# Patient Record
Sex: Male | Born: 1975 | Race: Black or African American | Hispanic: No | Marital: Single | Smoking: Current some day smoker
Health system: Southern US, Community
[De-identification: ages and names within clinical notes are randomized; demographics above are authoritative.]

## PROBLEM LIST (undated history)

## (undated) ENCOUNTER — Ambulatory Visit (HOSPITAL_COMMUNITY): Admission: EM | Payer: Self-pay

## (undated) ENCOUNTER — Ambulatory Visit (HOSPITAL_COMMUNITY): Admission: EM | Payer: Self-pay | Source: Home / Self Care

## (undated) DIAGNOSIS — F1410 Cocaine abuse, uncomplicated: Secondary | ICD-10-CM

## (undated) DIAGNOSIS — Z720 Tobacco use: Secondary | ICD-10-CM

## (undated) DIAGNOSIS — IMO0002 Reserved for concepts with insufficient information to code with codable children: Secondary | ICD-10-CM

---

## 2007-07-25 ENCOUNTER — Emergency Department (HOSPITAL_COMMUNITY): Admission: EM | Admit: 2007-07-25 | Discharge: 2007-07-25 | Payer: Self-pay | Admitting: Emergency Medicine

## 2009-02-27 ENCOUNTER — Emergency Department (HOSPITAL_COMMUNITY): Admission: EM | Admit: 2009-02-27 | Discharge: 2009-02-27 | Payer: Self-pay | Admitting: Emergency Medicine

## 2011-03-10 ENCOUNTER — Inpatient Hospital Stay (INDEPENDENT_AMBULATORY_CARE_PROVIDER_SITE_OTHER)
Admission: RE | Admit: 2011-03-10 | Discharge: 2011-03-10 | Disposition: A | Discharge status: Home / Self Care | Payer: Self-pay | Source: Ambulatory Visit | Attending: Emergency Medicine | Admitting: Emergency Medicine

## 2011-03-10 ENCOUNTER — Ambulatory Visit (INDEPENDENT_AMBULATORY_CARE_PROVIDER_SITE_OTHER): Payer: Self-pay

## 2011-03-10 DIAGNOSIS — S62309A Unspecified fracture of unspecified metacarpal bone, initial encounter for closed fracture: Secondary | ICD-10-CM

## 2011-03-17 ENCOUNTER — Emergency Department (HOSPITAL_COMMUNITY)
Admission: EM | Admit: 2011-03-17 | Discharge: 2011-03-17 | Discharge status: Against Medical Advice | Payer: Self-pay | Attending: Emergency Medicine | Admitting: Emergency Medicine

## 2011-03-17 DIAGNOSIS — M79609 Pain in unspecified limb: Secondary | ICD-10-CM | POA: Insufficient documentation

## 2011-03-17 DIAGNOSIS — X58XXXA Exposure to other specified factors, initial encounter: Secondary | ICD-10-CM | POA: Insufficient documentation

## 2011-03-17 DIAGNOSIS — M25549 Pain in joints of unspecified hand: Secondary | ICD-10-CM | POA: Insufficient documentation

## 2011-03-17 DIAGNOSIS — S62309A Unspecified fracture of unspecified metacarpal bone, initial encounter for closed fracture: Secondary | ICD-10-CM | POA: Insufficient documentation

## 2018-07-04 ENCOUNTER — Other Ambulatory Visit: Payer: Self-pay

## 2018-07-04 ENCOUNTER — Emergency Department (HOSPITAL_COMMUNITY)
Admission: EM | Admit: 2018-07-04 | Discharge: 2018-07-04 | Payer: Self-pay | Attending: Emergency Medicine | Admitting: Emergency Medicine

## 2018-07-04 ENCOUNTER — Encounter (HOSPITAL_COMMUNITY): Payer: Self-pay | Admitting: Emergency Medicine

## 2018-07-04 DIAGNOSIS — H5789 Other specified disorders of eye and adnexa: Secondary | ICD-10-CM | POA: Insufficient documentation

## 2018-07-04 DIAGNOSIS — Z77098 Contact with and (suspected) exposure to other hazardous, chiefly nonmedicinal, chemicals: Secondary | ICD-10-CM | POA: Insufficient documentation
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
## 2018-07-04 NOTE — ED Notes (Signed)
Morgan lens placed to blt eyes for irrigation. Pt tolerating at this time. Tetracaine drops placed prior to morgan lens insertion per MD Lynelle DoctorKnapp verbal order.

## 2018-07-04 NOTE — Discharge Instructions (Addendum)
Return here as needed. °
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
## 2018-07-04 NOTE — ED Notes (Signed)
Patient verbalizes understanding of discharge instructions. Opportunity for questioning and answers were provided. Armband removed by staff, pt discharged from ED ambulatory in custody w/ [employer]

## 2020-11-21 ENCOUNTER — Emergency Department (HOSPITAL_COMMUNITY): Payer: Self-pay

## 2020-11-21 ENCOUNTER — Emergency Department (HOSPITAL_COMMUNITY)
Admission: EM | Admit: 2020-11-21 | Discharge: 2020-11-21 | Disposition: A | Discharge status: Home / Self Care | Payer: Self-pay | Attending: Emergency Medicine | Admitting: Emergency Medicine

## 2020-11-21 ENCOUNTER — Other Ambulatory Visit: Payer: Self-pay

## 2020-11-21 ENCOUNTER — Encounter (HOSPITAL_COMMUNITY): Payer: Self-pay

## 2020-11-21 DIAGNOSIS — R109 Unspecified abdominal pain: Secondary | ICD-10-CM | POA: Insufficient documentation

## 2020-11-21 DIAGNOSIS — L0291 Cutaneous abscess, unspecified: Secondary | ICD-10-CM

## 2020-11-21 DIAGNOSIS — L0390 Cellulitis, unspecified: Secondary | ICD-10-CM | POA: Insufficient documentation

## 2020-11-21 DIAGNOSIS — Z7984 Long term (current) use of oral hypoglycemic drugs: Secondary | ICD-10-CM | POA: Insufficient documentation

## 2020-11-21 DIAGNOSIS — L02611 Cutaneous abscess of right foot: Secondary | ICD-10-CM | POA: Insufficient documentation

## 2020-11-21 DIAGNOSIS — R350 Frequency of micturition: Secondary | ICD-10-CM | POA: Insufficient documentation

## 2020-11-21 DIAGNOSIS — R739 Hyperglycemia, unspecified: Secondary | ICD-10-CM | POA: Insufficient documentation

## 2020-11-21 LAB — URINALYSIS, ROUTINE W REFLEX MICROSCOPIC
Bacteria, UA: NONE SEEN
Bilirubin Urine: NEGATIVE
Glucose, UA: >=500 mg/dL — AB
Hgb urine dipstick: NEGATIVE
Ketones, ur: 20 mg/dL — AB
Leukocytes,Ua: NEGATIVE
Nitrite: NEGATIVE
Protein, ur: NEGATIVE mg/dL
Specific Gravity, Urine: 1.031 — ABNORMAL HIGH (ref 1.005–1.030)
pH: 6 (ref 5.0–8.0)

## 2020-11-21 LAB — COMPREHENSIVE METABOLIC PANEL
ALT: 13 U/L (ref 0–44)
AST: 13 U/L — ABNORMAL LOW (ref 15–41)
Albumin: 2.7 g/dL — ABNORMAL LOW (ref 3.5–5.0)
Alkaline Phosphatase: 121 U/L (ref 38–126)
Anion gap: 10 (ref 5–15)
BUN: 8 mg/dL (ref 6–20)
CO2: 24 mmol/L (ref 22–32)
Calcium: 8.9 mg/dL (ref 8.9–10.3)
Chloride: 96 mmol/L — ABNORMAL LOW (ref 98–111)
Creatinine, Ser: 1.07 mg/dL (ref 0.61–1.24)
GFR, Estimated: >60 mL/min (ref >60)
Glucose, Bld: 624 mg/dL (ref 70–99)
Potassium: 3.7 mmol/L (ref 3.5–5.1)
Sodium: 130 mmol/L — ABNORMAL LOW (ref 135–145)
Total Bilirubin: 0.6 mg/dL (ref 0.3–1.2)
Total Protein: 7.8 g/dL (ref 6.5–8.1)

## 2020-11-21 LAB — PROTIME-INR
INR: 1.1 (ref 0.8–1.2)
Prothrombin Time: 13.9 seconds (ref 11.4–15.2)

## 2020-11-21 LAB — CBC WITH DIFFERENTIAL/PLATELET
Abs Immature Granulocytes: 0.07 10*3/uL (ref 0.00–0.07)
Basophils Absolute: 0.1 10*3/uL (ref 0.0–0.1)
Basophils Relative: 0 %
Eosinophils Absolute: 0 10*3/uL (ref 0.0–0.5)
Eosinophils Relative: 0 %
HCT: 39 % (ref 39.0–52.0)
Hemoglobin: 12.5 g/dL — ABNORMAL LOW (ref 13.0–17.0)
Immature Granulocytes: 1 %
Lymphocytes Relative: 10 %
Lymphs Abs: 1.5 10*3/uL (ref 0.7–4.0)
MCH: 26.2 pg (ref 26.0–34.0)
MCHC: 32.1 g/dL (ref 30.0–36.0)
MCV: 81.6 fL (ref 80.0–100.0)
Monocytes Absolute: 1.1 10*3/uL — ABNORMAL HIGH (ref 0.1–1.0)
Monocytes Relative: 7 %
Neutro Abs: 12.4 10*3/uL — ABNORMAL HIGH (ref 1.7–7.7)
Neutrophils Relative %: 82 %
Platelets: 318 10*3/uL (ref 150–400)
RBC: 4.78 MIL/uL (ref 4.22–5.81)
RDW: 12.2 % (ref 11.5–15.5)
WBC: 15.2 10*3/uL — ABNORMAL HIGH (ref 4.0–10.5)
nRBC: 0 % (ref 0.0–0.2)

## 2020-11-21 LAB — CBG MONITORING, ED: Glucose-Capillary: 397 mg/dL — ABNORMAL HIGH (ref 70–99)

## 2020-11-21 LAB — CK: Total CK: 158 U/L (ref 49–397)

## 2020-11-21 MED ORDER — LORAZEPAM 1 MG PO TABS
2.0000 mg | ORAL_TABLET | Freq: Once | ORAL | Status: AC
  Administered 2020-11-21: 2 mg via ORAL
  Filled 2020-11-21 (×2): qty 2

## 2020-11-21 MED ORDER — METFORMIN HCL 500 MG PO TABS
500.0000 mg | ORAL_TABLET | Freq: Once | ORAL | Status: AC
  Administered 2020-11-21: 500 mg via ORAL
  Filled 2020-11-21: qty 1

## 2020-11-21 MED ORDER — LACTATED RINGERS IV BOLUS
1000.0000 mL | Freq: Once | INTRAVENOUS | Status: AC
  Administered 2020-11-21: 1000 mL via INTRAVENOUS

## 2020-11-21 MED ORDER — LIDOCAINE-EPINEPHRINE (PF) 2 %-1:200000 IJ SOLN
20.0000 mL | Freq: Once | INTRAMUSCULAR | Status: AC
  Administered 2020-11-21: 20 mL
  Filled 2020-11-21: qty 20

## 2020-11-21 MED ORDER — LIDOCAINE-PRILOCAINE 2.5-2.5 % EX CREA
TOPICAL_CREAM | Freq: Once | CUTANEOUS | Status: AC
  Filled 2020-11-21: qty 5

## 2020-11-21 MED ORDER — OXYCODONE-ACETAMINOPHEN 5-325 MG PO TABS
2.0000 | ORAL_TABLET | Freq: Once | ORAL | Status: AC
  Administered 2020-11-21: 2 via ORAL
  Filled 2020-11-21: qty 2

## 2020-11-21 MED ORDER — SULFAMETHOXAZOLE-TRIMETHOPRIM 800-160 MG PO TABS: 1.0000 | ORAL_TABLET | Freq: Two times a day (BID) | ORAL | 0 refills | Status: AC

## 2020-11-21 MED ORDER — SULFAMETHOXAZOLE-TRIMETHOPRIM 800-160 MG PO TABS
1.0000 | ORAL_TABLET | Freq: Once | ORAL | Status: AC
  Administered 2020-11-21: 1 via ORAL
  Filled 2020-11-21: qty 1

## 2020-11-21 MED ORDER — METFORMIN HCL 500 MG PO TABS: 500.0000 mg | ORAL_TABLET | Freq: Two times a day (BID) | ORAL | 0 refills | Status: DC

## 2020-11-21 NOTE — ED Notes (Signed)
Pt reports pain and swelling in right lower leg after believed brown recluse spider bite approximately 11 days ago in increasing swelling, redness, and discoloration on bottom of foot and traveling up leg. Fever, chills, sweats, and diarrhea noted.  Increase in urination and left flank starting 9 days ago.

## 2020-11-21 NOTE — ED Provider Notes (Signed)
[facility] EMERGENCY DEPARTMENT Provider Note   CSN: [id number] Arrival date & time: 11/21/20  0054     History Chief Complaint  Patient presents with  . Insect Bite    Jeffrey Webb is a 45 y.o. male.  Patient states he was bitten by a spider that he believes was a brown recluse 9 days ago.  Patient states that redness and swelling is progressively worsened since that time.  States that he is keeping his kids awake at night because it hurts so bad.  No fevers.  No nausea or vomiting.  He does have some mild increased urination and left-sided flank pain apparently.    History reviewed. No pertinent past medical history.  There are no problems to display for this patient.   History reviewed. No pertinent surgical history.     History reviewed. No pertinent family history.  Social History   Substance Use Topics  . Alcohol use: Not Currently  . Drug use: Not Currently    Home Medications Prior to Admission medications   Medication Sig Start Date End Date Taking? Authorizing Provider  acetaminophen (TYLENOL) 500 MG tablet Take 1,000 mg by mouth every 6 (six) hours as needed for mild pain.   Yes [provider]  ibuprofen (ADVIL) 200 MG tablet Take 200 mg by mouth every 6 (six) hours as needed for mild pain.   Yes [provider]  metFORMIN (GLUCOPHAGE) 500 MG tablet Take 1 tablet (500 mg total) by mouth 2 (two) times daily with a meal. 11/21/20  Yes Sarath Privott, Barbara Cower, MD  sulfamethoxazole-trimethoprim (BACTRIM DS) 800-160 MG tablet Take 1 tablet by mouth 2 (two) times daily for 7 days. 11/21/20 11/28/20 Yes [place] Oborn, Barbara Cower, MD    Allergies    Patient has no known allergies.  Review of Systems   Review of Systems  All other systems reviewed and are negative.   Physical Exam Updated Vital Signs BP 132/90   Pulse 100   Temp 99 F (37.2 C) (Oral)   Resp 16   Ht 5\' 6"  (1.676 m)   Wt 90.7 kg   SpO2 100%   BMI 32.28 kg/m   Physical  Exam Vitals and nursing note reviewed.  Constitutional:      Appearance: He is well-developed.  HENT:     Head: Normocephalic and atraumatic.     Nose: Nose normal. No congestion or rhinorrhea.     Mouth/Throat:     Mouth: Mucous membranes are moist.     Pharynx: Oropharynx is clear.  Eyes:     Pupils: Pupils are equal, round, and reactive to light.  Cardiovascular:     Rate and Rhythm: Normal rate.  Pulmonary:     Effort: Pulmonary effort is normal. No respiratory distress.  Abdominal:     General: Abdomen is flat. There is no distension.  Musculoskeletal:        General: Normal range of motion.     Cervical back: Normal range of motion.  Skin:    General: Skin is warm and dry.     Findings: Erythema (right foot into lower right leg. also associated with 2x2 cm abscess to plantar surface of foot) present.  Neurological:     General: No focal deficit present.     Mental Status: He is alert.     ED Results / Procedures / Treatments   Labs (all labs ordered are listed, but only abnormal results are displayed) Labs Reviewed  CBC WITH DIFFERENTIAL/PLATELET - Abnormal; Notable  for the following components:      Result Value   WBC 15.2 (*)    Hemoglobin 12.5 (*)    Neutro Abs 12.4 (*)    Monocytes Absolute 1.1 (*)    All other components within normal limits  COMPREHENSIVE METABOLIC PANEL - Abnormal; Notable for the following components:   Sodium 130 (*)    Chloride 96 (*)    Glucose, Bld 624 (*)    Albumin 2.7 (*)    AST 13 (*)    All other components within normal limits  URINALYSIS, ROUTINE W REFLEX MICROSCOPIC - Abnormal; Notable for the following components:   Color, Urine STRAW (*)    Specific Gravity, Urine 1.031 (*)    Glucose, UA >=500 (*)    Ketones, ur 20 (*)    All other components within normal limits  CBG MONITORING, ED - Abnormal; Notable for the following components:   Glucose-Capillary 397 (*)    All other components within normal limits   PROTIME-INR  CK  CBG MONITORING, ED    EKG None  Radiology DG Foot Complete Right  Result Date: 11/21/2020 CLINICAL DATA:  45 year old male with insect bite to the right foot. EXAM: RIGHT FOOT COMPLETE - 3+ VIEW COMPARISON:  None. FINDINGS: There is no acute fracture or dislocation. The bones are well mineralized. No significant arthritic changes. Mild diffuse skin thickening. No radiopaque foreign object or soft tissue gas. IMPRESSION: Negative. Electronically Signed   By: Elgie Collard M.D.   On: 11/21/2020 02:19    Procedures .Marland KitchenIncision and Drainage  Date/Time: 11/21/2020 6:01 AM Performed by: Marily Memos, MD Authorized by: Marily Memos, MD   Consent:    Consent obtained:  Verbal   Risks, benefits, and alternatives were discussed: yes     Risks discussed:  Incomplete drainage, bleeding and infection Universal protocol:    Procedure explained and questions answered to patient or proxy's satisfaction: no     Relevant documents present and verified: yes     Test results available : yes     Imaging studies available: yes     Site/side marked: yes     Patient identity confirmed:  Verbally with patient and hospital-assigned identification number Location:    Type:  Abscess   Size:  2x2   Location:  Lower extremity   Lower extremity location:  Foot   Foot location:  R foot     Medications Ordered in ED Medications  sulfamethoxazole-trimethoprim (BACTRIM DS) 800-160 MG per tablet 1 tablet (1 tablet Oral Given 11/21/20 0238)  oxyCODONE-acetaminophen (PERCOCET/ROXICET) 5-325 MG per tablet 2 tablet (2 tablets Oral Given 11/21/20 0237)  LORazepam (ATIVAN) tablet 2 mg (2 mg Oral Given 11/21/20 0251)  lidocaine-prilocaine (EMLA) cream ( Topical Given 11/21/20 0300)  lidocaine-EPINEPHrine (XYLOCAINE W/EPI) 2 %-1:200000 (PF) injection 20 mL (20 mLs Infiltration Given 11/21/20 0350)  lactated ringers bolus 1,000 mL (0 mLs Intravenous Stopped 11/21/20 0514)  metFORMIN (GLUCOPHAGE) tablet  500 mg (500 mg Oral Given 11/21/20 0429)  lactated ringers bolus 1,000 mL (0 mLs Intravenous Stopped 11/21/20 1610)    ED Course  I have reviewed the triage vital signs and the nursing notes.  Pertinent labs & imaging results that were available during my care of the patient were reviewed by me and considered in my medical decision making (see chart for details).    MDM Rules/Calculators/A&P  Symptomatic care and then I&D. abx initiated. At this point it seems to be cellulitis and abscess. May or may not have had an actual spider bite.   I&D done. Found to have pretty significant hyperglycemia so was treated with a couple liters of fluid.  His blood sugar started to come down in the 300s at least.  Another liter of fluids were given and the patient was started on metformin.  I put in a referral for kidney health and wellness and get follow-up to get his new medications  Final Clinical Impression(s) / ED Diagnoses Final diagnoses:  Cellulitis, unspecified cellulitis site  Abscess  Hyperglycemia    Rx / DC Orders ED Discharge Orders         Ordered    sulfamethoxazole-trimethoprim (BACTRIM DS) 800-160 MG tablet  2 times daily        11/21/20 0552    metFORMIN (GLUCOPHAGE) 500 MG tablet  2 times daily with meals        11/21/20 0552           Magie Ciampa, Barbara Cower, MD 11/21/20 2303

## 2020-11-21 NOTE — ED Triage Notes (Signed)
Patient reports he got a bite to the bottom of his R foot about 1 week ago from what he thinks was a spider, swelling and redness noted, states he is unable to bear weight on that foot

## 2020-11-26 ENCOUNTER — Encounter (HOSPITAL_COMMUNITY): Payer: Self-pay | Admitting: *Deleted

## 2020-11-26 ENCOUNTER — Emergency Department (HOSPITAL_COMMUNITY)
Admission: EM | Admit: 2020-11-26 | Discharge: 2020-11-26 | Disposition: A | Discharge status: Home / Self Care | Payer: Self-pay | Attending: Emergency Medicine | Admitting: Emergency Medicine

## 2020-11-26 ENCOUNTER — Other Ambulatory Visit: Payer: Self-pay

## 2020-11-26 DIAGNOSIS — L02611 Cutaneous abscess of right foot: Secondary | ICD-10-CM | POA: Insufficient documentation

## 2020-11-26 DIAGNOSIS — M545 Low back pain, unspecified: Secondary | ICD-10-CM | POA: Insufficient documentation

## 2020-11-26 DIAGNOSIS — F17210 Nicotine dependence, cigarettes, uncomplicated: Secondary | ICD-10-CM | POA: Insufficient documentation

## 2020-11-26 DIAGNOSIS — R739 Hyperglycemia, unspecified: Secondary | ICD-10-CM | POA: Insufficient documentation

## 2020-11-26 DIAGNOSIS — Z7984 Long term (current) use of oral hypoglycemic drugs: Secondary | ICD-10-CM | POA: Insufficient documentation

## 2020-11-26 LAB — URINALYSIS, ROUTINE W REFLEX MICROSCOPIC
Bacteria, UA: NONE SEEN
Bilirubin Urine: NEGATIVE
Glucose, UA: >=500 mg/dL — AB
Hgb urine dipstick: NEGATIVE
Ketones, ur: 20 mg/dL — AB
Leukocytes,Ua: NEGATIVE
Nitrite: NEGATIVE
Protein, ur: NEGATIVE mg/dL
Specific Gravity, Urine: 1.029 (ref 1.005–1.030)
pH: 5 (ref 5.0–8.0)

## 2020-11-26 LAB — CBC WITH DIFFERENTIAL/PLATELET
Abs Immature Granulocytes: 0.05 10*3/uL (ref 0.00–0.07)
Basophils Absolute: 0.1 10*3/uL (ref 0.0–0.1)
Basophils Relative: 1 %
Eosinophils Absolute: 0.1 10*3/uL (ref 0.0–0.5)
Eosinophils Relative: 1 %
HCT: 40.3 % (ref 39.0–52.0)
Hemoglobin: 12.9 g/dL — ABNORMAL LOW (ref 13.0–17.0)
Immature Granulocytes: 1 %
Lymphocytes Relative: 20 %
Lymphs Abs: 1.9 10*3/uL (ref 0.7–4.0)
MCH: 25.7 pg — ABNORMAL LOW (ref 26.0–34.0)
MCHC: 32 g/dL (ref 30.0–36.0)
MCV: 80.4 fL (ref 80.0–100.0)
Monocytes Absolute: 0.8 10*3/uL (ref 0.1–1.0)
Monocytes Relative: 9 %
Neutro Abs: 6.5 10*3/uL (ref 1.7–7.7)
Neutrophils Relative %: 68 %
Platelets: 479 10*3/uL — ABNORMAL HIGH (ref 150–400)
RBC: 5.01 MIL/uL (ref 4.22–5.81)
RDW: 12.4 % (ref 11.5–15.5)
WBC: 9.5 10*3/uL (ref 4.0–10.5)
nRBC: 0 % (ref 0.0–0.2)

## 2020-11-26 LAB — COMPREHENSIVE METABOLIC PANEL
ALT: 13 U/L (ref 0–44)
AST: 18 U/L (ref 15–41)
Albumin: 2.8 g/dL — ABNORMAL LOW (ref 3.5–5.0)
Alkaline Phosphatase: 115 U/L (ref 38–126)
Anion gap: 10 (ref 5–15)
BUN: 10 mg/dL (ref 6–20)
CO2: 25 mmol/L (ref 22–32)
Calcium: 9.2 mg/dL (ref 8.9–10.3)
Chloride: 96 mmol/L — ABNORMAL LOW (ref 98–111)
Creatinine, Ser: 1.12 mg/dL (ref 0.61–1.24)
GFR, Estimated: >60 mL/min (ref >60)
Glucose, Bld: 512 mg/dL (ref 70–99)
Potassium: 4.4 mmol/L (ref 3.5–5.1)
Sodium: 131 mmol/L — ABNORMAL LOW (ref 135–145)
Total Bilirubin: 0.3 mg/dL (ref 0.3–1.2)
Total Protein: 8.3 g/dL — ABNORMAL HIGH (ref 6.5–8.1)

## 2020-11-26 LAB — CBG MONITORING, ED
Glucose-Capillary: 551 mg/dL (ref 70–99)
Glucose-Capillary: 305 mg/dL — ABNORMAL HIGH (ref 70–99)

## 2020-11-26 MED ORDER — ACETAMINOPHEN 500 MG PO TABS
1000.0000 mg | ORAL_TABLET | Freq: Once | ORAL | Status: AC
  Administered 2020-11-26: 1000 mg via ORAL
  Filled 2020-11-26: qty 2

## 2020-11-26 MED ORDER — KETOROLAC TROMETHAMINE 15 MG/ML IJ SOLN
15.0000 mg | Freq: Once | INTRAMUSCULAR | Status: AC
  Administered 2020-11-26: 15 mg via INTRAMUSCULAR

## 2020-11-26 MED ORDER — MORPHINE SULFATE (PF) 4 MG/ML IV SOLN
4.0000 mg | Freq: Once | INTRAVENOUS | Status: AC
  Administered 2020-11-26: 4 mg via INTRAVENOUS
  Filled 2020-11-26: qty 1

## 2020-11-26 MED ORDER — SODIUM CHLORIDE 0.9 % IV BOLUS (SEPSIS)
1000.0000 mL | Freq: Once | INTRAVENOUS | Status: AC
  Administered 2020-11-26: 1000 mL via INTRAVENOUS

## 2020-11-26 MED ORDER — TRAMADOL HCL 50 MG PO TABS
50.0000 mg | ORAL_TABLET | Freq: Once | ORAL | Status: AC
  Administered 2020-11-26: 50 mg via ORAL
  Filled 2020-11-26: qty 1

## 2020-11-26 MED ORDER — ACETAMINOPHEN 325 MG PO TABS: 325.0000 mg | ORAL_TABLET | Freq: Once | ORAL | Status: DC

## 2020-11-26 MED ORDER — METHOCARBAMOL 500 MG PO TABS: 500.0000 mg | ORAL_TABLET | Freq: Four times a day (QID) | ORAL | 0 refills | Status: DC

## 2020-11-26 MED ORDER — KETOROLAC TROMETHAMINE 15 MG/ML IJ SOLN: 15.0000 mg | Freq: Once | INTRAMUSCULAR | Status: DC

## 2020-11-26 MED ORDER — SODIUM CHLORIDE 0.9 % IV SOLN
1000.0000 mL | INTRAVENOUS | Status: DC
  Administered 2020-11-26: 1000 mL via INTRAVENOUS

## 2020-11-26 MED ORDER — INSULIN ASPART 100 UNIT/ML IJ SOLN
10.0000 [IU] | Freq: Once | INTRAMUSCULAR | Status: AC
  Administered 2020-11-26: 10 [IU] via SUBCUTANEOUS

## 2020-11-26 MED ORDER — TRAMADOL HCL 50 MG PO TABS: 50.0000 mg | ORAL_TABLET | Freq: Four times a day (QID) | ORAL | 0 refills | Status: DC | PRN

## 2020-11-26 MED ORDER — KETOROLAC TROMETHAMINE 15 MG/ML IJ SOLN
15.0000 mg | Freq: Once | INTRAMUSCULAR | Status: DC
  Filled 2020-11-26: qty 1

## 2020-11-26 NOTE — ED Provider Notes (Signed)
[facility] EMERGENCY DEPARTMENT Provider Note   CSN: [id number] Arrival date & time: 11/26/20  0540     History Chief Complaint  Patient presents with  . Back Pain    Jeffrey Webb is a 45 y.o. male.  Pt has been sitting and lying due to foot infection.  Pt now has pain in his low back.  Pt reports foot looks better but is still painful.  Pt is not taking metformin  The history is provided by the patient. No language interpreter was used.  Back Pain Location:  Sacro-iliac joint Quality:  Aching Radiates to:  Does not radiate Pain severity:  Severe Chronicity:  New Relieved by:  Nothing Worsened by:  Ambulation Ineffective treatments:  None tried      History reviewed. No pertinent past medical history.  There are no problems to display for this patient.   No past surgical history on file.     No family history on file.  Social History   Tobacco Use  . Smoking status: Current Some Day Smoker  . Smokeless tobacco: Never Used  Substance Use Topics  . Alcohol use: Not Currently  . Drug use: Not Currently    Home Medications Prior to Admission medications   Medication Sig Start Date End Date Taking? Authorizing Provider  methocarbamol (ROBAXIN) 500 MG tablet Take 1 tablet (500 mg total) by mouth 4 (four) times daily. 11/26/20  Yes Cheron Schaumann K, PA-C  traMADol (ULTRAM) 50 MG tablet Take 1 tablet (50 mg total) by mouth every 6 (six) hours as needed. 11/26/20 11/26/21 Yes Elson Areas, PA-C  acetaminophen (TYLENOL) 500 MG tablet Take 1,000 mg by mouth every 6 (six) hours as needed for mild pain.    [provider]  ibuprofen (ADVIL) 200 MG tablet Take 200 mg by mouth every 6 (six) hours as needed for mild pain.    [provider]  metFORMIN (GLUCOPHAGE) 500 MG tablet Take 1 tablet (500 mg total) by mouth 2 (two) times daily with a meal. 11/21/20   Mesner, Barbara Cower, MD  sulfamethoxazole-trimethoprim (BACTRIM DS) 800-160 MG tablet  Take 1 tablet by mouth 2 (two) times daily for 7 days. 11/21/20 11/28/20  Mesner, Barbara Cower, MD    Allergies    Patient has no known allergies.  Review of Systems   Review of Systems  Musculoskeletal: Positive for back pain.  All other systems reviewed and are negative.   Physical Exam Updated Vital Signs BP 125/88 (BP Location: Left Arm)   Pulse 75   Temp 98 F (36.7 C) (Oral)   Resp 17   Ht 5\' 10"  (1.778 m)   Wt 90.7 kg   SpO2 99%   BMI 28.70 kg/m   Physical Exam Vitals and nursing note reviewed.  Constitutional:      Appearance: He is well-developed.  HENT:     Head: Normocephalic and atraumatic.  Eyes:     Conjunctiva/sclera: Conjunctivae normal.  Cardiovascular:     Rate and Rhythm: Normal rate and regular rhythm.     Heart sounds: No murmur heard.   Pulmonary:     Effort: Pulmonary effort is normal. No respiratory distress.     Breath sounds: Normal breath sounds.  Abdominal:     Palpations: Abdomen is soft.     Tenderness: There is no abdominal tenderness.  Musculoskeletal:     Cervical back: Neck supple.  Skin:    General: Skin is warm and dry.  Neurological:  General: No focal deficit present.     Mental Status: He is alert.  Psychiatric:        Mood and Affect: Mood normal.     ED Results / Procedures / Treatments   Labs (all labs ordered are listed, but only abnormal results are displayed) Labs Reviewed  URINALYSIS, ROUTINE W REFLEX MICROSCOPIC - Abnormal; Notable for the following components:      Result Value   Glucose, UA >=500 (*)    Ketones, ur 20 (*)    All other components within normal limits  CBC WITH DIFFERENTIAL/PLATELET - Abnormal; Notable for the following components:   Hemoglobin 12.9 (*)    MCH 25.7 (*)    Platelets 479 (*)    All other components within normal limits  COMPREHENSIVE METABOLIC PANEL - Abnormal; Notable for the following components:   Sodium 131 (*)    Chloride 96 (*)    Glucose, Bld 512 (*)    Total  Protein 8.3 (*)    Albumin 2.8 (*)    All other components within normal limits  CBG MONITORING, ED - Abnormal; Notable for the following components:   Glucose-Capillary 551 (*)    All other components within normal limits  CBG MONITORING, ED - Abnormal; Notable for the following components:   Glucose-Capillary 305 (*)    All other components within normal limits    EKG None  Radiology No results found.  Procedures Procedures   Medications Ordered in ED Medications  sodium chloride 0.9 % bolus 1,000 mL (0 mLs Intravenous Stopped 11/26/20 1352)    Followed by  sodium chloride 0.9 % bolus 1,000 mL (0 mLs Intravenous Stopped 11/26/20 1350)    Followed by  0.9 %  sodium chloride infusion (1,000 mLs Intravenous New Bag/Given 11/26/20 1355)  acetaminophen (TYLENOL) tablet 1,000 mg (1,000 mg Oral Given 11/26/20 0601)  ketorolac (TORADOL) 15 MG/ML injection 15 mg (15 mg Intramuscular Given 11/26/20 0609)  morphine 4 MG/ML injection 4 mg (4 mg Intravenous Given 11/26/20 1231)  insulin aspart (novoLOG) injection 10 Units (10 Units Subcutaneous Given 11/26/20 1351)    ED Course  I have reviewed the triage vital signs and the nursing notes.  Pertinent labs & imaging results that were available during my care of the patient were reviewed by me and considered in my medical decision making (see chart for details).    MDM Rules/Calculators/A&P                          MDM:  Pt has elevated glucose.  Pt given Iv fluids and insulin subq.   Pt reports he has not been taking metformin because his Mother told him it causes worms.  I spoke with pt about problems that occur from unmanaged diabetes.  I have asked TOC to help him obtain primary care.  I have serious concerns about compliance that  I expressed to pt.   Final Clinical Impression(s) / ED Diagnoses Final diagnoses:  Acute low back pain without sciatica, unspecified back pain laterality  Hyperglycemia  Abscess of right foot    Rx / DC  Orders ED Discharge Orders         Ordered    methocarbamol (ROBAXIN) 500 MG tablet  4 times daily        11/26/20 1437    traMADol (ULTRAM) 50 MG tablet  Every 6 hours PRN        11/26/20 1437  Elson Areas, [place] 11/26/20 1504    Margarita Grizzle, MD 11/28/20 1531

## 2020-11-26 NOTE — ED Notes (Signed)
Reviewed discharge instructions with patient. Follow-up care and medications reviewed. Patient verbalized understanding. Patient A&Ox4, VSS upon discharge. 

## 2020-11-26 NOTE — ED Provider Notes (Signed)
Emergency Medicine Provider Triage Evaluation Note  Jeffrey Webb , a 45 y.o. male  was evaluated in triage.  Pt complains of patient complains of 3 days of back pain that he states is achy has been constant for 3 days he states that he has not had any trauma to his back.  Denies any history of IV drug use any blood thinner use, no history of cancer.  He states that his pain is now resolved after he was given Toradol and Tylenol in the waiting room.  Patient states he is also here for reevaluation of his left foot which he had an abscess incised and drained 5/8.  He states he has been taking his antibiotics as prescribed.  Denies any fevers or chills.  Review of Systems  Positive: Left foot pain, back pain Negative: Urinary dysuria frequency urgency, hematuria, chest pain abdominal pain lightheadedness or dizziness  Physical Exam  BP 133/89 (BP Location: Left Arm)   Pulse 96   Temp 98 F (36.7 C) (Oral)   Resp 20   Ht 5\' 10"  (1.778 m)   Wt 90.7 kg   SpO2 99%   BMI 28.70 kg/m  Gen:   Awake, no distress  Resp:  Normal effort MSK:   Moves extremities without difficulty, no midline tenderness to palpation of C, T, L-spine Other:  Left foot with incision and plantar surface of foot.  This was covered with a pad that was removed.  There is no active purulence present.  Given the setting of the waiting room where this was evaluated I am unable to get deep probing view of the depth of the wound.    Medical Decision Making  Medically screening exam initiated at 8:07 AM.  Appropriate orders placed.  Jeffrey Webb was informed that the remainder of the evaluation will be completed by another provider, this initial triage assessment does not replace that evaluation, and the importance of remaining in the ED until their evaluation is complete.  Patient is overall well-appearing and nontoxic.  Left foot status post incision and drainage.  He is taking antibiotics. From examination of his back pain  standpoint patient is overall well-appearing does not seem to have any significant red flag symptoms and his pain is improved.  Medical screening exam completed.   Jeffrey Webb, DOLE 11/26/20 11/28/20    [id number], MD 12/08/20 [phone]

## 2020-11-26 NOTE — Discharge Instructions (Addendum)
Take Metformin as prescribed.

## 2020-11-26 NOTE — ED Triage Notes (Signed)
C/o back pian onset 3 days ago states he was in the ed for foot pian and back pain however his back pain wasn't addressed.

## 2020-12-01 ENCOUNTER — Encounter: Payer: Self-pay | Admitting: Student

## 2020-12-01 ENCOUNTER — Telehealth: Payer: Self-pay | Admitting: *Deleted

## 2020-12-01 NOTE — Telephone Encounter (Signed)
Called patient unable to leave message at this time. Not voice mail. Patient no showed for his appointment.

## 2020-12-04 ENCOUNTER — Emergency Department (HOSPITAL_COMMUNITY)
Admission: EM | Admit: 2020-12-04 | Discharge: 2020-12-04 | Disposition: A | Discharge status: Home / Self Care | Payer: Self-pay | Attending: Emergency Medicine | Admitting: Emergency Medicine

## 2020-12-04 ENCOUNTER — Inpatient Hospital Stay (HOSPITAL_COMMUNITY)
Admission: EM | Admit: 2020-12-04 | Discharge: 2020-12-23 | DRG: 637 | Discharge status: Against Medical Advice | Payer: Self-pay | Attending: Internal Medicine | Admitting: Internal Medicine

## 2020-12-04 ENCOUNTER — Emergency Department (HOSPITAL_COMMUNITY): Payer: Self-pay

## 2020-12-04 ENCOUNTER — Encounter (HOSPITAL_COMMUNITY): Payer: Self-pay

## 2020-12-04 ENCOUNTER — Other Ambulatory Visit: Payer: Self-pay

## 2020-12-04 ENCOUNTER — Encounter (HOSPITAL_COMMUNITY): Payer: Self-pay | Admitting: *Deleted

## 2020-12-04 DIAGNOSIS — R109 Unspecified abdominal pain: Secondary | ICD-10-CM

## 2020-12-04 DIAGNOSIS — I5032 Chronic diastolic (congestive) heart failure: Secondary | ICD-10-CM | POA: Diagnosis present

## 2020-12-04 DIAGNOSIS — R1032 Left lower quadrant pain: Secondary | ICD-10-CM

## 2020-12-04 DIAGNOSIS — E1165 Type 2 diabetes mellitus with hyperglycemia: Secondary | ICD-10-CM | POA: Diagnosis present

## 2020-12-04 DIAGNOSIS — B9561 Methicillin susceptible Staphylococcus aureus infection as the cause of diseases classified elsewhere: Secondary | ICD-10-CM | POA: Diagnosis present

## 2020-12-04 DIAGNOSIS — Z20822 Contact with and (suspected) exposure to covid-19: Secondary | ICD-10-CM | POA: Diagnosis present

## 2020-12-04 DIAGNOSIS — L02619 Cutaneous abscess of unspecified foot: Secondary | ICD-10-CM

## 2020-12-04 DIAGNOSIS — L089 Local infection of the skin and subcutaneous tissue, unspecified: Secondary | ICD-10-CM

## 2020-12-04 DIAGNOSIS — R350 Frequency of micturition: Secondary | ICD-10-CM | POA: Insufficient documentation

## 2020-12-04 DIAGNOSIS — R7881 Bacteremia: Secondary | ICD-10-CM | POA: Diagnosis present

## 2020-12-04 DIAGNOSIS — M549 Dorsalgia, unspecified: Secondary | ICD-10-CM

## 2020-12-04 DIAGNOSIS — K5900 Constipation, unspecified: Secondary | ICD-10-CM

## 2020-12-04 DIAGNOSIS — F17200 Nicotine dependence, unspecified, uncomplicated: Secondary | ICD-10-CM | POA: Diagnosis present

## 2020-12-04 DIAGNOSIS — E1169 Type 2 diabetes mellitus with other specified complication: Principal | ICD-10-CM | POA: Diagnosis present

## 2020-12-04 DIAGNOSIS — Z5329 Procedure and treatment not carried out because of patient's decision for other reasons: Secondary | ICD-10-CM | POA: Diagnosis present

## 2020-12-04 DIAGNOSIS — F1910 Other psychoactive substance abuse, uncomplicated: Secondary | ICD-10-CM

## 2020-12-04 DIAGNOSIS — Z91128 Patient's intentional underdosing of medication regimen for other reason: Secondary | ICD-10-CM

## 2020-12-04 DIAGNOSIS — K6812 Psoas muscle abscess: Secondary | ICD-10-CM

## 2020-12-04 DIAGNOSIS — M62830 Muscle spasm of back: Secondary | ICD-10-CM | POA: Diagnosis present

## 2020-12-04 DIAGNOSIS — F1410 Cocaine abuse, uncomplicated: Secondary | ICD-10-CM | POA: Diagnosis present

## 2020-12-04 DIAGNOSIS — K5901 Slow transit constipation: Secondary | ICD-10-CM | POA: Diagnosis present

## 2020-12-04 DIAGNOSIS — R7989 Other specified abnormal findings of blood chemistry: Secondary | ICD-10-CM

## 2020-12-04 DIAGNOSIS — Z5321 Procedure and treatment not carried out due to patient leaving prior to being seen by health care provider: Secondary | ICD-10-CM | POA: Insufficient documentation

## 2020-12-04 DIAGNOSIS — M722 Plantar fascial fibromatosis: Secondary | ICD-10-CM | POA: Diagnosis present

## 2020-12-04 DIAGNOSIS — R7401 Elevation of levels of liver transaminase levels: Secondary | ICD-10-CM | POA: Diagnosis present

## 2020-12-04 DIAGNOSIS — M4646 Discitis, unspecified, lumbar region: Secondary | ICD-10-CM | POA: Diagnosis present

## 2020-12-04 DIAGNOSIS — M4626 Osteomyelitis of vertebra, lumbar region: Secondary | ICD-10-CM | POA: Diagnosis present

## 2020-12-04 DIAGNOSIS — E118 Type 2 diabetes mellitus with unspecified complications: Secondary | ICD-10-CM

## 2020-12-04 DIAGNOSIS — L0291 Cutaneous abscess, unspecified: Secondary | ICD-10-CM

## 2020-12-04 DIAGNOSIS — T383X6A Underdosing of insulin and oral hypoglycemic [antidiabetic] drugs, initial encounter: Secondary | ICD-10-CM | POA: Diagnosis present

## 2020-12-04 HISTORY — DX: Methicillin susceptible Staphylococcus aureus infection as the cause of diseases classified elsewhere: B95.61

## 2020-12-04 LAB — URINALYSIS, ROUTINE W REFLEX MICROSCOPIC
Bacteria, UA: NONE SEEN
Bilirubin Urine: NEGATIVE
Glucose, UA: >=500 mg/dL — AB
Hgb urine dipstick: NEGATIVE
Ketones, ur: 5 mg/dL — AB
Leukocytes,Ua: NEGATIVE
Nitrite: NEGATIVE
Protein, ur: NEGATIVE mg/dL
Specific Gravity, Urine: 1.028 (ref 1.005–1.030)
pH: 6 (ref 5.0–8.0)

## 2020-12-04 LAB — COMPREHENSIVE METABOLIC PANEL
ALT: 20 U/L (ref 0–44)
AST: 14 U/L — ABNORMAL LOW (ref 15–41)
Albumin: 3 g/dL — ABNORMAL LOW (ref 3.5–5.0)
Alkaline Phosphatase: 104 U/L (ref 38–126)
Anion gap: 9 (ref 5–15)
BUN: 13 mg/dL (ref 6–20)
CO2: 23 mmol/L (ref 22–32)
Calcium: 9.4 mg/dL (ref 8.9–10.3)
Chloride: 101 mmol/L (ref 98–111)
Creatinine, Ser: 0.98 mg/dL (ref 0.61–1.24)
GFR, Estimated: >60 mL/min (ref >60)
Glucose, Bld: 348 mg/dL — ABNORMAL HIGH (ref 70–99)
Potassium: 4.2 mmol/L (ref 3.5–5.1)
Sodium: 133 mmol/L — ABNORMAL LOW (ref 135–145)
Total Bilirubin: 0.3 mg/dL (ref 0.3–1.2)
Total Protein: 8.3 g/dL — ABNORMAL HIGH (ref 6.5–8.1)

## 2020-12-04 LAB — CBC WITH DIFFERENTIAL/PLATELET
Abs Immature Granulocytes: 0.02 10*3/uL (ref 0.00–0.07)
Basophils Absolute: 0.1 10*3/uL (ref 0.0–0.1)
Basophils Relative: 1 %
Eosinophils Absolute: 0 10*3/uL (ref 0.0–0.5)
Eosinophils Relative: 0 %
HCT: 40.6 % (ref 39.0–52.0)
Hemoglobin: 13.2 g/dL (ref 13.0–17.0)
Immature Granulocytes: 0 %
Lymphocytes Relative: 19 %
Lymphs Abs: 2.1 10*3/uL (ref 0.7–4.0)
MCH: 26.3 pg (ref 26.0–34.0)
MCHC: 32.5 g/dL (ref 30.0–36.0)
MCV: 80.9 fL (ref 80.0–100.0)
Monocytes Absolute: 0.7 10*3/uL (ref 0.1–1.0)
Monocytes Relative: 7 %
Neutro Abs: 7.9 10*3/uL — ABNORMAL HIGH (ref 1.7–7.7)
Neutrophils Relative %: 73 %
Platelets: 462 10*3/uL — ABNORMAL HIGH (ref 150–400)
RBC: 5.02 MIL/uL (ref 4.22–5.81)
RDW: 12.5 % (ref 11.5–15.5)
WBC: 10.9 10*3/uL — ABNORMAL HIGH (ref 4.0–10.5)
nRBC: 0 % (ref 0.0–0.2)

## 2020-12-04 LAB — RESP PANEL BY RT-PCR (FLU A&B, COVID) ARPGX2
Influenza A by PCR: NEGATIVE
Influenza B by PCR: NEGATIVE
SARS Coronavirus 2 by RT PCR: NEGATIVE

## 2020-12-04 LAB — RAPID URINE DRUG SCREEN, HOSP PERFORMED
Amphetamines: NOT DETECTED
Barbiturates: NOT DETECTED
Benzodiazepines: NOT DETECTED
Cocaine: POSITIVE — AB
Opiates: NOT DETECTED
Tetrahydrocannabinol: NOT DETECTED

## 2020-12-04 LAB — LIPASE, BLOOD: Lipase: 24 U/L (ref 11–51)

## 2020-12-04 MED ORDER — ONDANSETRON HCL 4 MG/2ML IJ SOLN
4.0000 mg | Freq: Once | INTRAMUSCULAR | Status: AC
  Administered 2020-12-04: 4 mg via INTRAVENOUS
  Filled 2020-12-04: qty 2

## 2020-12-04 MED ORDER — METHOCARBAMOL 500 MG PO TABS
500.0000 mg | ORAL_TABLET | Freq: Four times a day (QID) | ORAL | Status: DC | PRN
  Administered 2020-12-05 – 2020-12-08 (×10): 500 mg via ORAL
  Filled 2020-12-04 (×10): qty 1

## 2020-12-04 MED ORDER — GADOBUTROL 1 MMOL/ML IV SOLN
9.0000 mL | Freq: Once | INTRAVENOUS | Status: AC | PRN
  Administered 2020-12-04: 9 mL via INTRAVENOUS

## 2020-12-04 MED ORDER — OXYCODONE-ACETAMINOPHEN 5-325 MG PO TABS: 1.0000 | ORAL_TABLET | ORAL | Status: DC | PRN

## 2020-12-04 MED ORDER — NICOTINE 14 MG/24HR TD PT24
14.0000 mg | MEDICATED_PATCH | Freq: Every day | TRANSDERMAL | Status: DC
  Administered 2020-12-05 – 2020-12-23 (×19): 14 mg via TRANSDERMAL
  Filled 2020-12-04 (×20): qty 1

## 2020-12-04 MED ORDER — VANCOMYCIN HCL 1000 MG/200ML IV SOLN
1000.0000 mg | Freq: Three times a day (TID) | INTRAVENOUS | Status: DC
  Filled 2020-12-04: qty 200

## 2020-12-04 MED ORDER — INSULIN ASPART 100 UNIT/ML IJ SOLN
0.0000 [IU] | Freq: Three times a day (TID) | INTRAMUSCULAR | Status: DC
Start: 2020-12-05 — End: 2020-12-23
  Administered 2020-12-05: 11 [IU] via SUBCUTANEOUS
  Administered 2020-12-05 (×2): 8 [IU] via SUBCUTANEOUS
  Administered 2020-12-06: 11 [IU] via SUBCUTANEOUS
  Administered 2020-12-06: 8 [IU] via SUBCUTANEOUS
  Administered 2020-12-06: 5 [IU] via SUBCUTANEOUS
  Administered 2020-12-07: 8 [IU] via SUBCUTANEOUS
  Administered 2020-12-07 (×2): 5 [IU] via SUBCUTANEOUS
  Administered 2020-12-08: 3 [IU] via SUBCUTANEOUS
  Administered 2020-12-08 (×2): 8 [IU] via SUBCUTANEOUS
  Administered 2020-12-09: 11 [IU] via SUBCUTANEOUS
  Administered 2020-12-09: 8 [IU] via SUBCUTANEOUS
  Administered 2020-12-09: 5 [IU] via SUBCUTANEOUS
  Administered 2020-12-10: 11 [IU] via SUBCUTANEOUS
  Administered 2020-12-10 – 2020-12-11 (×2): 5 [IU] via SUBCUTANEOUS
  Administered 2020-12-11 (×2): 11 [IU] via SUBCUTANEOUS
  Administered 2020-12-12: 3 [IU] via SUBCUTANEOUS
  Administered 2020-12-12: 11 [IU] via SUBCUTANEOUS
  Administered 2020-12-12 – 2020-12-13 (×2): 5 [IU] via SUBCUTANEOUS
  Administered 2020-12-13: 8 [IU] via SUBCUTANEOUS
  Administered 2020-12-13: 11 [IU] via SUBCUTANEOUS
  Administered 2020-12-14: 5 [IU] via SUBCUTANEOUS
  Administered 2020-12-14: 3 [IU] via SUBCUTANEOUS
  Administered 2020-12-14 – 2020-12-15 (×3): 5 [IU] via SUBCUTANEOUS
  Administered 2020-12-15: 3 [IU] via SUBCUTANEOUS
  Administered 2020-12-16: 5 [IU] via SUBCUTANEOUS
  Administered 2020-12-16: 2 [IU] via SUBCUTANEOUS
  Administered 2020-12-16 – 2020-12-17 (×2): 5 [IU] via SUBCUTANEOUS
  Administered 2020-12-17 (×2): 3 [IU] via SUBCUTANEOUS
  Administered 2020-12-18: 2 [IU] via SUBCUTANEOUS
  Administered 2020-12-18 (×2): 8 [IU] via SUBCUTANEOUS
  Administered 2020-12-19: 5 [IU] via SUBCUTANEOUS
  Administered 2020-12-20: 8 [IU] via SUBCUTANEOUS
  Administered 2020-12-20: 11 [IU] via SUBCUTANEOUS
  Administered 2020-12-21: 3 [IU] via SUBCUTANEOUS
  Administered 2020-12-21: 8 [IU] via SUBCUTANEOUS
  Administered 2020-12-21: 3 [IU] via SUBCUTANEOUS
  Administered 2020-12-22: 5 [IU] via SUBCUTANEOUS
  Administered 2020-12-22: 8 [IU] via SUBCUTANEOUS
  Administered 2020-12-22 – 2020-12-23 (×2): 5 [IU] via SUBCUTANEOUS
  Administered 2020-12-23: 11 [IU] via SUBCUTANEOUS

## 2020-12-04 MED ORDER — HYDROMORPHONE HCL 1 MG/ML IJ SOLN: 1.0000 mg | Freq: Once | INTRAMUSCULAR | Status: DC

## 2020-12-04 MED ORDER — KETOROLAC TROMETHAMINE 30 MG/ML IJ SOLN
30.0000 mg | Freq: Once | INTRAMUSCULAR | Status: AC
  Administered 2020-12-04: 30 mg via INTRAVENOUS
  Filled 2020-12-04: qty 1

## 2020-12-04 MED ORDER — MORPHINE SULFATE (PF) 4 MG/ML IV SOLN
4.0000 mg | Freq: Once | INTRAVENOUS | Status: AC
  Administered 2020-12-04: 4 mg via INTRAVENOUS
  Filled 2020-12-04: qty 1

## 2020-12-04 MED ORDER — MORPHINE SULFATE (PF) 2 MG/ML IV SOLN
2.0000 mg | INTRAVENOUS | Status: DC | PRN
  Administered 2020-12-05 (×3): 2 mg via INTRAVENOUS
  Filled 2020-12-04 (×3): qty 1

## 2020-12-04 MED ORDER — HYDROCODONE-ACETAMINOPHEN 5-325 MG PO TABS: 1.0000 | ORAL_TABLET | Freq: Once | ORAL | Status: DC

## 2020-12-04 MED ORDER — ALBUTEROL SULFATE (2.5 MG/3ML) 0.083% IN NEBU
2.5000 mg | INHALATION_SOLUTION | Freq: Four times a day (QID) | RESPIRATORY_TRACT | Status: DC | PRN
Start: 2020-12-04 — End: 2020-12-23

## 2020-12-04 MED ORDER — VANCOMYCIN HCL 1750 MG/350ML IV SOLN
1750.0000 mg | Freq: Once | INTRAVENOUS | Status: DC
  Filled 2020-12-04: qty 350

## 2020-12-04 MED ORDER — ONDANSETRON HCL 4 MG PO TABS: 4.0000 mg | ORAL_TABLET | Freq: Four times a day (QID) | ORAL | Status: DC | PRN

## 2020-12-04 MED ORDER — MORPHINE SULFATE (PF) 4 MG/ML IV SOLN
4.0000 mg | Freq: Once | INTRAVENOUS | Status: AC
Start: 2020-12-04 — End: 2020-12-04
  Administered 2020-12-04: 4 mg via INTRAVENOUS
  Filled 2020-12-04: qty 1

## 2020-12-04 MED ORDER — ONDANSETRON HCL 4 MG/2ML IJ SOLN: 4.0000 mg | Freq: Four times a day (QID) | INTRAMUSCULAR | Status: DC | PRN

## 2020-12-04 MED ORDER — ACETAMINOPHEN 325 MG PO TABS
650.0000 mg | ORAL_TABLET | Freq: Four times a day (QID) | ORAL | Status: DC | PRN
  Administered 2020-12-06 – 2020-12-23 (×24): 650 mg via ORAL
  Filled 2020-12-04 (×24): qty 2

## 2020-12-04 MED ORDER — ACETAMINOPHEN 650 MG RE SUPP: 650.0000 mg | Freq: Four times a day (QID) | RECTAL | Status: DC | PRN

## 2020-12-04 MED ORDER — ADULT MULTIVITAMIN W/MINERALS CH
1.0000 | ORAL_TABLET | Freq: Every day | ORAL | Status: DC
  Administered 2020-12-05 – 2020-12-23 (×19): 1 via ORAL
  Filled 2020-12-04 (×20): qty 1

## 2020-12-04 MED ORDER — OXYCODONE HCL 5 MG PO TABS
5.0000 mg | ORAL_TABLET | ORAL | Status: DC | PRN
Start: 2020-12-04 — End: 2020-12-08
  Administered 2020-12-05 – 2020-12-08 (×14): 5 mg via ORAL
  Filled 2020-12-04 (×14): qty 1

## 2020-12-04 MED ORDER — INSULIN ASPART 100 UNIT/ML IJ SOLN
0.0000 [IU] | Freq: Every day | INTRAMUSCULAR | Status: DC
  Administered 2020-12-05: 5 [IU] via SUBCUTANEOUS
  Administered 2020-12-05: 3 [IU] via SUBCUTANEOUS
  Administered 2020-12-07 – 2020-12-08 (×2): 2 [IU] via SUBCUTANEOUS
  Administered 2020-12-09 – 2020-12-14 (×2): 3 [IU] via SUBCUTANEOUS
  Administered 2020-12-17: 2 [IU] via SUBCUTANEOUS

## 2020-12-04 MED ORDER — MORPHINE SULFATE (PF) 4 MG/ML IV SOLN: 4.0000 mg | Freq: Once | INTRAVENOUS | Status: DC

## 2020-12-04 MED ORDER — HYDROMORPHONE HCL 1 MG/ML IJ SOLN
0.5000 mg | Freq: Once | INTRAMUSCULAR | Status: AC
  Administered 2020-12-04: 0.5 mg via INTRAVENOUS
  Filled 2020-12-04: qty 1

## 2020-12-04 NOTE — ED Notes (Signed)
Pt given food bag per Particia Nearing, MD

## 2020-12-04 NOTE — H&P (Signed)
History and Physical  Patient Name: Jeffrey Webb     ZOX:[id number]    DOB: 01-02-1976    DOA: 12/04/2020 PCP: Patient, No Pcp Per (Inactive)  Patient coming from: Home  Chief Complaint: Back pain    HPI: Jeffrey Webb is a 45 y.o. male, with PMH of type 2 diabetes, substance abuse, tobacco abuse, who presented to the ER on 12/04/2020 with back pain for over 3 weeks.  Patient states he has had ongoing back pain for the past 3 weeks.  The pain came on all of a sudden, denies any trauma, injury or fall.  Pain is worse with movement, but he has been ambulating okay.  He has also had some intermittent abdominal pain, mostly left lower quadrant.  Denies any nausea or vomiting.  He said he has been urinating more frequently and denies any hematuria.  He was recently on Bactrim for a foot wound that has resolved and completed course of Bactrim.  He is supposed to be on metformin for his diabetes but says he needs refill.  He was previously given muscle relaxants, pain medication, with no improvement in his pain.  Denies history of IV drug use.    ED course: -Vitals on admission: Afebrile, heart rate 101, respiratory rate 22, blood pressure 118/78, maintaining sats on room air -Labs on initial presentation: Sodium 133, potassium 4.2, chloride 101, bicarb 23, glucose 348, BUN 13, creatinine 0.98, albumin 3, WBC 10.9, hemoglobin 13.2, platelets 462, COVID-negative urine drug screen positive for cocaine -Imaging obtained on admission: MRI and CT of abdomen pelvis and spine demonstrating findings concerning with L2 discitis and/or osteomyelitis - Neurosurgery was contacted from the ED and recommended IR guided biopsy versus psoas abscess drainage.  Additionally we will hold off on antibiotics until cultures obtained unless his blood cultures are positive -In the ED the patient was given Dilaudid, Toradol, morphine, Zofran, and the hospitalist service was contacted for further evaluation and  management.     ROS: A complete and thorough 12 point review of systems obtained, negative listed in HPI.     History reviewed. No pertinent past medical history.  History reviewed. No pertinent surgical history.  Social History: Patient lives at home.  The patient walks without assistance.  Current smoker.  No Known Allergies  Family history: family history is not on file.  Prior to Admission medications   Medication Sig Start Date End Date Taking? Authorizing Provider  acetaminophen (TYLENOL) 500 MG tablet Take 2,000 mg by mouth every 6 (six) hours as needed for mild pain.   Yes [provider]  ibuprofen (ADVIL) 200 MG tablet Take 1,000 mg by mouth every 6 (six) hours as needed for mild pain.   Yes [provider]  Multiple Vitamin (MULTIVITAMIN) tablet Take 1 tablet by mouth daily.   Yes [provider]  metFORMIN (GLUCOPHAGE) 500 MG tablet Take 1 tablet (500 mg total) by mouth 2 (two) times daily with a meal. Patient not taking: No sig reported 11/21/20   Mesner, Barbara Cower, MD  methocarbamol (ROBAXIN) 500 MG tablet Take 1 tablet (500 mg total) by mouth 4 (four) times daily. Patient not taking: No sig reported 11/26/20   Elson Areas, PA-C  sulfamethoxazole-trimethoprim (BACTRIM DS) 800-160 MG tablet Take 1 tablet by mouth See admin instructions. Bid x 7 days Patient not taking: No sig reported    [provider]  traMADol (ULTRAM) 50 MG tablet Take 1 tablet (50 mg total) by mouth every 6 (six) hours as  needed. Patient not taking: No sig reported 11/26/20 11/26/21  Osie Cheeks       Physical Exam: BP 129/89   Pulse 69   Temp 98.5 F (36.9 C) (Oral)   Resp 17   Ht  (1.676 m)   Wt 88.5 kg   SpO2 100%   BMI 31.47 kg/m   General appearance: Well-developed, adult male, alert and in  slight distress secondary to pain Eyes: Anicteric, conjunctiva pink, lids and lashes normal. PERRL.    ENT: No nasal deformity, discharge,  epistaxis.  Hearing intact. OP moist without lesions.   Neck: No neck masses.  Trachea midline.  No thyromegaly/tenderness. Lymph: No cervical or supraclavicular lymphadenopathy. Skin: Warm and dry.  No jaundice.  No suspicious rashes or lesions. Cardiac: RRR, nl S1-S2, no murmurs appreciated.  No LE edema.  Radial and pedal pulses 2+ and symmetric. Respiratory: Normal respiratory rate and rhythm.  CTAB without rales or wheezes. Abdomen: Abdomen soft.  No tenderness with palpation. No ascites, distension, hepatosplenomegaly.   MSK: No deformities or effusions of the large joints of the upper or lower extremities bilaterally.  No cyanosis or clubbing. Neuro: Cranial nerves 2 through 12 grossly intact.  Sensation intact to light touch. Speech is fluent.  Marland Kitchen    Psych: Sensorium intact and responding to questions, attention normal.  Behavior appropriate.  Judgment and insight appear normal.    Labs on Admission:  I have personally reviewed following labs and imaging studies: CBC: Recent Labs  Lab 12/04/20 0915  WBC 10.9*  NEUTROABS 7.9*  HGB 13.2  HCT 40.6  MCV 80.9  PLT 462*   Basic Metabolic Panel: Recent Labs  Lab 12/04/20 0915  NA 133*  K 4.2  CL 101  CO2 23  GLUCOSE 348*  BUN 13  CREATININE 0.98  CALCIUM 9.4   GFR: Estimated Creatinine Clearance: 100.3 mL/min (by C-G formula based on SCr of 0.98 mg/dL).  Liver Function Tests: Recent Labs  Lab 12/04/20 0915  AST 14*  ALT 20  ALKPHOS 104  BILITOT 0.3  PROT 8.3*  ALBUMIN 3.0*   Recent Labs  Lab 12/04/20 0915  LIPASE 24   No results for input(s): AMMONIA in the last 168 hours. Coagulation Profile: No results for input(s): INR, PROTIME in the last 168 hours. Cardiac Enzymes: No results for input(s): CKTOTAL, CKMB, CKMBINDEX, TROPONINI in the last 168 hours. BNP (last 3 results) No results for input(s): PROBNP in the last 8760 hours. HbA1C: No results for input(s): HGBA1C in the last 72 hours. CBG: No  results for input(s): GLUCAP in the last 168 hours. Lipid Profile: No results for input(s): CHOL, HDL, LDLCALC, TRIG, CHOLHDL, LDLDIRECT in the last 72 hours. Thyroid Function Tests: No results for input(s): TSH, T4TOTAL, FREET4, T3FREE, THYROIDAB in the last 72 hours. Anemia Panel: No results for input(s): VITAMINB12, FOLATE, FERRITIN, TIBC, IRON, RETICCTPCT in the last 72 hours.   Recent Results (from the past 240 hour(s))  Resp Panel by RT-PCR (Flu A&B, Covid) Nasopharyngeal Swab     Status: None   Collection Time: 12/04/20  6:42 PM   Specimen: Nasopharyngeal Swab; Nasopharyngeal(NP) swabs in vial transport medium  Result Value Ref Range Status   SARS Coronavirus 2 by RT PCR NEGATIVE NEGATIVE Final    Comment: (NOTE) SARS-CoV-2 target nucleic acids are NOT DETECTED.  The SARS-CoV-2 RNA is generally detectable in upper respiratory specimens during the acute phase of infection. The lowest concentration of SARS-CoV-2 viral copies this assay can  detect is 138 copies/mL. A negative result does not preclude SARS-Cov-2 infection and should not be used as the sole basis for treatment or other patient management decisions. A negative result may occur with  improper specimen collection/handling, submission of specimen other than nasopharyngeal swab, presence of viral mutation(s) within the areas targeted by this assay, and inadequate number of viral copies(<138 copies/mL). A negative result must be combined with clinical observations, patient history, and epidemiological information. The expected result is Negative.  Fact Sheet for Patients:  BloggerCourse.com  Fact Sheet for Healthcare Providers:  SeriousBroker.it  This test is no t yet approved or cleared by the [place] FDA and  has been authorized for detection and/or diagnosis of SARS-CoV-2 by FDA under an Emergency Use Authorization (EUA). This EUA will remain  in effect  (meaning this test can be used) for the duration of the COVID-19 declaration under Section 564(b)(1) of the Act, 21 U.S.C.section 360bbb-3(b)(1), unless the authorization is terminated  or revoked sooner.       Influenza A by PCR NEGATIVE NEGATIVE Final   Influenza B by PCR NEGATIVE NEGATIVE Final    Comment: (NOTE) The Xpert Xpress SARS-CoV-2/FLU/RSV plus assay is intended as an aid in the diagnosis of influenza from Nasopharyngeal swab specimens and should not be used as a sole basis for treatment. Nasal washings and aspirates are unacceptable for Xpert Xpress SARS-CoV-2/FLU/RSV testing.  Fact Sheet for Patients: BloggerCourse.com  Fact Sheet for Healthcare Providers: SeriousBroker.it  This test is not yet approved or cleared by the [place] FDA and has been authorized for detection and/or diagnosis of SARS-CoV-2 by FDA under an Emergency Use Authorization (EUA). This EUA will remain in effect (meaning this test can be used) for the duration of the COVID-19 declaration under Section 564(b)(1) of the Act, 21 U.S.C. section 360bbb-3(b)(1), unless the authorization is terminated or revoked.  Performed at [facility], [address], [place], [place] [address]            Radiological Exams on Admission: Personally reviewed imaging which shows: MRI and CT of abdomen pelvis and spine demonstrating findings concerning with L2 discitis and/or osteomyelitis and left psoas abscess  CT ABDOMEN PELVIS WO CONTRAST  Result Date: 12/04/2020 CLINICAL DATA:  Abdominal pain, acute, nonlocalized. EXAM: CT ABDOMEN AND PELVIS WITHOUT CONTRAST TECHNIQUE: Multidetector CT imaging of the abdomen and pelvis was performed following the standard protocol without IV contrast. COMPARISON:  None. FINDINGS: Lower chest: Minimal atelectasis is present at the bases. No nodule or mass lesion is present. No significant airspace consolidation is  present. Heart size is normal. No significant pleural or pericardial effusion is present. Hepatobiliary: No focal liver abnormality is seen. No gallstones, gallbladder wall thickening, or biliary dilatation. Pancreas: Unremarkable. No pancreatic ductal dilatation or surrounding inflammatory changes. Spleen: Normal in size without focal abnormality. Adrenals/Urinary Tract: Adrenal glands are normal bilaterally. A 2-3 mm nonobstructing stone is present at the lower pole of the left kidney. No other significant nephrolithiasis is present. Chest mass lesion or obstruction is present. Ureters are within normal limits. The urinary bladder is normal. Stomach/Bowel: Stomach and duodenum are within normal limits. Small bowel is unremarkable. Terminal ileum is within normal limits. Appendix is visualized and within normal limits. Extends into the anatomic pelvis. The ascending and transverse colon are normal. The descending and sigmoid colon are unremarkable. Vascular/Lymphatic: No significant vascular findings are present. No enlarged abdominal or pelvic lymph nodes. Reproductive: Prostate is unremarkable. Other: No abdominal wall hernia or  abnormality. No abdominopelvic ascites. Musculoskeletal: Vertebral body heights and alignment are normal. Lucency noted at the inferior left aspect of the L2 vertebral body. Asymmetric ossification is present along the left side of the disc margin. No other focal lucencies are evident. IMPRESSION: 1. Focal lucency to along the left inferior aspect of the L2 vertebral body with extension laterally raises concern for metastatic disease. Recommend MRI of the lumbar spine without and with contrast. No other focal osseous lesions are present. No primary lesion is evident. 2. No other acute or focal lesion to explain the patient's abdominal pain. 3. 2-3 mm nonobstructing stone at the lower pole of the left kidney. Electronically Signed   By: Marin Roberts M.D.   On: 12/04/2020 11:47    MR LUMBAR SPINE WO CONTRAST  Result Date: 12/04/2020 CLINICAL DATA:  Low back pain.  Abnormal CT lumbar spine EXAM: MRI LUMBAR SPINE WITHOUT CONTRAST TECHNIQUE: Multiplanar, multisequence MR imaging of the lumbar spine was performed. No intravenous contrast was administered. COMPARISON:  CT lumbar spine 12/04/2020 FINDINGS: Segmentation:  Normal Alignment:  Normal Vertebrae: There is edema in the L2 vertebral body on the left extending to the endplate. There is a smaller area of edema in the superior endplate of L3 on the left. The endplates appear eroded on the left at L2-3. There is extensive edema in the left psoas muscle which is enlarged and hyperintense on T2. There are small loculated fluid collections in the left psoas muscle suggestive of abscess. No epidural abscess. Postcontrast imaging recommended for further evaluation. No other areas of bone marrow edema. Conus medullaris and cauda equina: Conus extends to the L1-2 level. Conus and cauda equina appear normal. Paraspinal and other soft tissues: Left psoas muscle is asymmetrically enlarged and edematous with loculated small fluid collections in the left psoas muscle. Right psoas muscle is normal. Disc levels: L1-2: Negative L2-3: Mild disc space narrowing. Endplate erosion on the left with edema extending into the L2 and L3 vertebral bodies. Right side of the disc space appears normal. No significant spinal stenosis. L3-4: Negative L4-5: Mild disc and mild facet degeneration.  Negative for stenosis L5-S1: Mild disc bulging and mild facet degeneration. Mild subarticular and foraminal stenosis bilaterally. IMPRESSION: Endplate erosions at L2-3 on the left. There is extensive bone marrow edema on the left at L2 and a smaller area of edema in the superior bone marrow at L3 on the left. This extends into the left psoas muscle which is enlarged, edematous, with multiple loculated fluid collections. Findings most likely due to discitis rather than tumor.  Postcontrast imaging is recommended to further evaluate the abscesses and evaluate for epidural abscess which is not definitely seen on the unenhanced study. These results were called by telephone at the time of interpretation on 12/04/2020 at 4:52 pm to provider PA, who verbally acknowledged these results. Electronically Signed   By: Marlan Palau M.D.   On: 12/04/2020 16:53   MR LUMBAR SPINE W CONTRAST  Result Date: 12/04/2020 CLINICAL DATA:  Probable L2-3 discitis osteomyelitis. Follow-up postcontrast imaging. EXAM: MRI LUMBAR SPINE WITH CONTRAST TECHNIQUE: Axial and sagittal T1 pre and postcontrast sequences of the lumbar spine were performed. CONTRAST:  80mL GADAVIST GADOBUTROL 1 MMOL/ML IV SOLN COMPARISON:  Same day noncontrast lumbar spine MRI and CT FINDINGS: Segmentation:  Standard. Alignment:  Physiologic. Vertebrae: Findings of discitis-osteomyelitis of the L2-3 level with endplate erosions. Prominent marrow enhancement throughout the L2 vertebral body extending into the left pedicle. Mild marrow enhancement at the superior  aspect of the L3 vertebral body. Confluent low T1 signal within the vertebral bodies at these locations. The remaining vertebral bodies are within normal limits. No evidence of fracture. No additional sites of discitis-osteomyelitis are evident. Conus medullaris and cauda equina: Conus extends to the L1-2 level. No epidural phlegmon or abscess. Paraspinal and other soft tissues: Asymmetric enlargement of the left psoas muscle centered at the L2-3 level with diffuse intramuscular enhancement. Fluid signal within the left psoas muscle without definite peripherally enhancing collection suggests myositis with phlegmon. Disc levels: Please see previous MRI report for level by level detail of the disc levels. IMPRESSION: 1. Postcontrast images of the lumbar spine are consistent with discitis-osteomyelitis at the L2-3 level. No epidural phlegmon or abscess. 2. Asymmetric enlargement of the  left psoas muscle centered at the L2-3 level with diffuse intramuscular enhancement. Fluid signal within the left psoas muscle without definite peripherally enhancing collection suggests myositis with phlegmon. Electronically Signed   By: Duanne Guess D.O.   On: 12/04/2020 19:42   CT L-SPINE NO CHARGE  Result Date: 12/04/2020 CLINICAL DATA:  Low back pain several weeks. EXAM: CT LUMBAR SPINE WITHOUT CONTRAST TECHNIQUE: Multidetector CT imaging of the lumbar spine was performed without intravenous contrast administration. Multiplanar CT image reconstructions were also generated. COMPARISON:  CT abdomen pelvis 12/04/2020 FINDINGS: Segmentation: Normal Alignment: Normal Vertebrae: There is bony destruction of the inferior L2 vertebral body on the left extending to the disc space. There is also mild erosion of the superior endplate of L3 on the left. Small amount of calcification extends lateral from the disc space into the left psoas muscle. No fracture is identified. No other skeletal lesion identified. Paraspinal and other soft tissues: No paraspinous fluid collection identified. Calcification lateral to the L2-3 disc space on the left extends into the psoas muscle which is likely due to bony destruction. Disc levels: L1-2: Negative L2-3: Mild disc space narrowing. Erosive endplate changes are present on the left at L2-3 primarily at L2 with bony destruction extending well into the left L2 vertebral body on the left. Calcification extending into the left psoas muscle. Mild left foraminal narrowing. L3-4: Mild disc bulging and mild facet degeneration. No significant stenosis. L4-5: Mild disc bulging and mild to moderate facet degeneration bilaterally. 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 There is mild erosion of the superior endplate of L3. There appears to be bone extending into the left psoas muscle  which is likely due to destructive mass lesion. Favor metastatic disease although infection is a consideration. Recommend MRI lumbar spine without with contrast. No significant spinal stenosis. Electronically Signed   By: Marlan Palau M.D.   On: 12/04/2020 11:33         Assessment/Plan   1.  Acute discitis and/or osteomyelitis -Imaging above demonstrated L2-L3 discitis/osteomyelitis - Neurosurgery contacted by the ER, recommend IR biopsy versus abscess drainage - Holding off on antibiotics until biopsy tomorrow - N.p.o. at midnight - Blood cultures obtained on admission - Pain control as warranted  2.  Poorly controlled type 2 diabetes -Glucose on admission 348 - Hemoglobin A1c ordered - Glucose checks, sliding scale - Hold home metformin for now  3.  Left psoas abscess -Imaging above demonstrated left psoas abscess - We will plan for IR consult in the morning for possible abscess drainage  4.  Substance abuse -Urine drug screen positive for cocaine - Recommend cessation  5.  Tobacco abuse -  Recommend cessation - Nicotine patch prescribed  6.  Pseudohyponatremia -Secondary to hyperglycemia - We will work on improving glucose - Follow-up labs ordered     DVT prophylaxis: SCDs Code Status: Full Family Communication: Fianc at bedside Disposition Plan: Anticipate discharge home when medically optimized Consults called: Neurosurgery contacted by ER Admission status: inpatient    Medical decision making: Patient seen at 10:46 PM on 12/04/2020.  The patient was discussed with ER provider.  What exists of the patient's chart was reviewed in depth and summarized above.  Clinical condition: Fair.        Laqueta Dueichard G Ronson Hagins [facility] Please page though AMION or Epic secure chat:  For password, contact charge nurse

## 2020-12-04 NOTE — ED Triage Notes (Signed)
Patient reports back and flank pain, denies hematuria, reports increased frequency

## 2020-12-04 NOTE — ED Notes (Signed)
Patient transported to MRI 

## 2020-12-04 NOTE — ED Notes (Signed)
[facility] called by Jeffrey Webb pt will be added to the list they are back up unable to give time

## 2020-12-04 NOTE — ED Provider Notes (Signed)
[facility] EMERGENCY DEPARTMENT Provider Note   [facility] [address] [facility] [address] [facility]: [id number] Arrival date & time: 12/04/20  [id number]     History Chief Complaint  Patient presents with  . Back Pain  . Abdominal Pain    Jeffrey Webb is a 45 y.o. male for evaluation of abdominal pain, back pain.  He reports that the back pain has been ongoing for a few weeks.  No preceding trauma, injury, fall.  Patient states he was seen here few weeks ago and was given some medication but states it is not helping.  He states it is worse when he moves and walks.  He has been able to ambulate.  He also reports since yesterday, he has had intermittent abdominal pain.  Mostly in the left lower quadrant.  He states it comes in spurts and is worse when he has to have a bowel movement.  No nausea/vomiting.  He has had some increased urinary frequency but denies any dysuria, hematuria.  Denies fevers, weight loss, numbness/weakness of upper and lower extremities, bowel/bladder incontinence, saddle anesthesia, history of back surgery, history of IVDA.    The history is provided by the patient.       History reviewed. No pertinent past medical history.  There are no problems to display for this patient.   History reviewed. No pertinent surgical history.     No family history on file.  Social History   Tobacco Use  . Smoking status: Current Some Day Smoker  . Smokeless tobacco: Never Used  Substance Use Topics  . Alcohol use: Not Currently  . Drug use: Not Currently    Home Medications Prior to Admission medications   Medication Sig Start Date End Date Taking? Authorizing Provider  acetaminophen (TYLENOL) 500 MG tablet Take 1,000 mg by mouth every 6 (six) hours as needed for mild pain.    [provider]  ibuprofen (ADVIL) 200 MG tablet Take 200 mg by mouth every 6 (six) hours as needed for mild pain.    [provider]  metFORMIN (GLUCOPHAGE) 500 MG tablet Take 1 tablet (500 mg total) by  mouth 2 (two) times daily with a meal. 11/21/20   Mesner, Barbara CowerJason, MD  methocarbamol (ROBAXIN) 500 MG tablet Take 1 tablet (500 mg total) by mouth 4 (four) times daily. 11/26/20   Elson AreasSofia, Leslie K, PA-C  traMADol (ULTRAM) 50 MG tablet Take 1 tablet (50 mg total) by mouth every 6 (six) hours as needed. 11/26/20 11/26/21  Elson AreasSofia, Leslie K, PA-C    Allergies    Patient has no known allergies.  Review of Systems   Review of Systems  Constitutional: Negative for fever.  Respiratory: Negative for cough and shortness of breath.   Cardiovascular: Negative for chest pain.  Gastrointestinal: Positive for abdominal pain. Negative for nausea and vomiting.  Genitourinary: Positive for frequency. Negative for dysuria and hematuria.  Musculoskeletal: Positive for back pain.  Neurological: Negative for headaches.  All other systems reviewed and are negative.   Physical Exam Updated Vital Signs BP 120/87   Pulse 61   Temp 98.5 F (36.9 C) (Oral)   Resp 15   Ht 5\' 6"  (1.676 m)   Wt 88.5 kg   SpO2 99%   BMI 31.47 kg/m   Physical Exam Vitals and nursing note reviewed.  Constitutional:      Appearance: Normal appearance. He is well-developed.     Comments: NAD, sleeping  HENT:     Head: Normocephalic and atraumatic.  Eyes:  General: Lids are normal.     Conjunctiva/sclera: Conjunctivae normal.     Pupils: Pupils are equal, round, and reactive to light.  Neck:     Comments: Full flexion/extension and lateral movement of neck fully intact. No bony midline tenderness. No deformities or crepitus.  Cardiovascular:     Rate and Rhythm: Normal rate and regular rhythm.     Pulses: Normal pulses.     Heart sounds: Normal heart sounds. No murmur heard. No friction rub. No gallop.   Pulmonary:     Effort: Pulmonary effort is normal.     Breath sounds: Normal breath sounds.     Comments: Lungs clear to auscultation bilaterally.  Symmetric chest rise.  No wheezing, rales, rhonchi. Abdominal:      Palpations: Abdomen is soft. Abdomen is not rigid.     Tenderness: There is abdominal tenderness in the left lower quadrant. There is no right CVA tenderness, left CVA tenderness or guarding.     Comments: Tenderness to palpation noted to the LLQ. No rigidity, guarding. No CVA tenderness noted bilaterally.   Musculoskeletal:        General: Normal range of motion.     Cervical back: Full passive range of motion without pain.       Back:     Comments: No midline T spine tenderness. No deformity or step off. Diffuse lower lumbar tenderness noted to the bilateral paraspinal muscles and into the midline. No deformity or step offs noted. No overlying warmth, erythema, edema.   Skin:    General: Skin is warm and dry.     Capillary Refill: Capillary refill takes less than 2 seconds.  Neurological:     Mental Status: He is alert and oriented to person, place, and time.     Comments: Follows commands, Moves all extremities  5/5 strength to BUE and BLE  Sensation intact throughout all major nerve distributions  Psychiatric:        Speech: Speech normal.     ED Results / Procedures / Treatments   Labs (all labs ordered are listed, but only abnormal results are displayed) Labs Reviewed  COMPREHENSIVE METABOLIC PANEL - Abnormal; Notable for the following components:      Result Value   Sodium 133 (*)    Glucose, Bld 348 (*)    Total Protein 8.3 (*)    Albumin 3.0 (*)    AST 14 (*)    All other components within normal limits  CBC WITH DIFFERENTIAL/PLATELET - Abnormal; Notable for the following components:   WBC 10.9 (*)    Platelets 462 (*)    Neutro Abs 7.9 (*)    All other components within normal limits  LIPASE, BLOOD  URINALYSIS, ROUTINE W REFLEX MICROSCOPIC    EKG None  Radiology CT ABDOMEN PELVIS WO CONTRAST  Result Date: 12/04/2020 CLINICAL DATA:  Abdominal pain, acute, nonlocalized. EXAM: CT ABDOMEN AND PELVIS WITHOUT CONTRAST TECHNIQUE: Multidetector CT imaging of the  abdomen and pelvis was performed following the standard protocol without IV contrast. COMPARISON:  None. FINDINGS: Lower chest: Minimal atelectasis is present at the bases. No nodule or mass lesion is present. No significant airspace consolidation is present. Heart size is normal. No significant pleural or pericardial effusion is present. Hepatobiliary: No focal liver abnormality is seen. No gallstones, gallbladder wall thickening, or biliary dilatation. Pancreas: Unremarkable. No pancreatic ductal dilatation or surrounding inflammatory changes. Spleen: Normal in size without focal abnormality. Adrenals/Urinary Tract: Adrenal glands are normal bilaterally. A 2-3 mm  nonobstructing stone is present at the lower pole of the left kidney. No other significant nephrolithiasis is present. Chest mass lesion or obstruction is present. Ureters are within normal limits. The urinary bladder is normal. Stomach/Bowel: Stomach and duodenum are within normal limits. Small bowel is unremarkable. Terminal ileum is within normal limits. Appendix is visualized and within normal limits. Extends into the anatomic pelvis. The ascending and transverse colon are normal. The descending and sigmoid colon are unremarkable. Vascular/Lymphatic: No significant vascular findings are present. No enlarged abdominal or pelvic lymph nodes. Reproductive: Prostate is unremarkable. Other: No abdominal wall hernia or abnormality. No abdominopelvic ascites. Musculoskeletal: Vertebral body heights and alignment are normal. Lucency noted at the inferior left aspect of the L2 vertebral body. Asymmetric ossification is present along the left side of the disc margin. No other focal lucencies are evident. IMPRESSION: 1. Focal lucency to along the left inferior aspect of the L2 vertebral body with extension laterally raises concern for metastatic disease. Recommend MRI of the lumbar spine without and with contrast. No other focal osseous lesions are present. No  primary lesion is evident. 2. No other acute or focal lesion to explain the patient's abdominal pain. 3. 2-3 mm nonobstructing stone at the lower pole of the left kidney. Electronically Signed   By: Marin Roberts M.D.   On: 12/04/2020 11:47   CT L-SPINE NO CHARGE  Result Date: 12/04/2020 CLINICAL DATA:  Low back pain several weeks. EXAM: CT LUMBAR SPINE WITHOUT CONTRAST TECHNIQUE: Multidetector CT imaging of the lumbar spine was performed without intravenous contrast administration. Multiplanar CT image reconstructions were also generated. COMPARISON:  CT abdomen pelvis 12/04/2020 FINDINGS: Segmentation: Normal Alignment: Normal Vertebrae: There is bony destruction of the inferior L2 vertebral body on the left extending to the disc space. There is also mild erosion of the superior endplate of L3 on the left. Small amount of calcification extends lateral from the disc space into the left psoas muscle. No fracture is identified. No other skeletal lesion identified. Paraspinal and other soft tissues: No paraspinous fluid collection identified. Calcification lateral to the L2-3 disc space on the left extends into the psoas muscle which is likely due to bony destruction. Disc levels: L1-2: Negative L2-3: Mild disc space narrowing. Erosive endplate changes are present on the left at L2-3 primarily at L2 with bony destruction extending well into the left L2 vertebral body on the left. Calcification extending into the left psoas muscle. Mild left foraminal narrowing. L3-4: Mild disc bulging and mild facet degeneration. No significant stenosis. L4-5: Mild disc bulging and mild to moderate facet degeneration bilaterally. 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 There is mild erosion of the superior endplate of L3. There appears to be bone extending into the left psoas muscle which is likely due to destructive mass  lesion. Favor metastatic disease although infection is a consideration. Recommend MRI lumbar spine without with contrast. No significant spinal stenosis. Electronically Signed   By: Marlan Palau M.D.   On: 12/04/2020 11:33    Procedures Procedures   Medications Ordered in ED Medications  ketorolac (TORADOL) 30 MG/ML injection 30 mg (30 mg Intravenous Given 12/04/20 0921)  ondansetron (ZOFRAN) injection 4 mg (4 mg Intravenous Given 12/04/20 1257)  morphine 4 MG/ML injection 4 mg (4 mg Intravenous Given 12/04/20 1259)    ED Course  I have reviewed the triage vital signs and the nursing notes.  Pertinent labs &  imaging results that were available during my care of the patient were reviewed by me and considered in my medical decision making (see chart for details).    MDM Rules/Calculators/A&P                          45 year old male who presents for evaluation of back pain and abdominal pain.  States back pain has been ongoing for several weeks.  Abdominal pain started yesterday.  Reports it is intermittent and worse when he attempts to have a bowel movement.  On initial arrival, he is afebrile, nontoxic-appearing.  Vital signs are stable.  On exam, he has diffuse tenderness palpation noted to the lower lumbar region.  He also has some tenderness noted to the left lower quadrant.  No rigidity, guarding.  No CVA tenderness.  Consider musculoskeletal back pain.  Low suspicion for kidney stone/GU etiology.  Consider infectious process versus constipation versus diverticulitis.  Plan for labs.  Patient is 3133, glucose of 348, BUN of 13, creatinine 0.98.  Anion gap is 9.  CBC shows slight leukocytosis of 10.9.  Lipase unremarkable.  Work-up not consistent with DKA.  UA collected earlier today shows no infectious etiology.   Imaging shows focal lucency to the left inferior aspect of the L2 vertebral body with extension laterally raise concern for metastatic disease.  No other focal osseous lesions  are present.  He has a 2 to 3 mm nonobstructing stone in the left kidney. CT lumbar spine shows bony destruction of L2 vertebral body on the left extending to the endplate. There is mild erosion of the superior endplate of L3.   Patient signed out to [facility], PA-C pending MRI.   Portions of this note were generated with Scientist, clinical (histocompatibility and immunogenetics). Dictation errors may occur despite best attempts at proofreading.   Final Clinical Impression(s) / ED Diagnoses Final diagnoses:  Back pain  Left lower quadrant abdominal pain    Rx / DC Orders ED Discharge Orders    None       Rosana Hoes 12/04/20 1606    Jacalyn Lefevre, MD 12/04/20 1724

## 2020-12-04 NOTE — ED Notes (Signed)
Patient said he was frustrated and wanted to leave. He called his ride. Moving OTF.

## 2020-12-04 NOTE — ED Notes (Signed)
Patient called x1. No response.

## 2020-12-04 NOTE — ED Triage Notes (Signed)
Pt states lower back pain for several weeks.  Was tx here last week.  No relief with meds given.

## 2020-12-04 NOTE — ED Provider Notes (Signed)
Accepted handoff at shift change from L Layden PA-C. Please see prior provider note for more detail.   Briefly: Patient is 45 y.o. patient has had low back pain that has been ongoing worsening and constant for the past 3 weeks.  He denies any trauma, injuries, falls.  He was seen a few weeks ago for back pain at that time his blood sugar was quite high and patient was rehydrated, provided with insulin and discharged home with close follow-up with PCP.  He has not yet had follow-up appointment.  The symptoms have worsened.  He denies any fevers, IV drug use or spine surgeries.  DDX: concern for discitis, spinal epidural abscess, cauda equina unlikely  Plan: Follow-up on MRI.    Physical Exam  BP 125/71   Pulse 65   Temp 98.5 F (36.9 C) (Oral)   Resp 15   Ht 5\' 6"  (1.676 m)   Wt 88.5 kg   SpO2 100%   BMI 31.47 kg/m   Physical Exam  Physical Exam Vitals and nursing note reviewed.  Constitutional:      Appearance: Normal appearance. He is well-developed.     Comments: Is acutely uncomfortable.  Moaning in pain. HENT:     Head: Normocephalic and atraumatic.  Eyes:     General: Lids are normal.     Conjunctiva/sclera: Conjunctivae normal.     Pupils: Pupils are equal, round, and reactive to light.  Neck:     Comments: Full flexion/extension and lateral movement of neck fully intact. No bony midline tenderness. No deformities or crepitus.  Cardiovascular:     Rate and Rhythm: Normal rate and regular rhythm.     Pulses: Normal pulses.     Heart sounds: Normal heart sounds. No murmur heard. No friction rub. No gallop.   Pulmonary:     Effort: Pulmonary effort is normal.     Breath sounds: Normal breath sounds.     Comments: Lungs clear to auscultation bilaterally.  Symmetric chest rise.  No wheezing, rales, rhonchi. Abdominal:     Palpations: Abdomen is soft. Abdomen is not rigid.     Tenderness: There is abdominal tenderness in the left lower quadrant. There is no right CVA  tenderness, left CVA tenderness or guarding.     Comments: Tenderness to palpation noted to the LLQ. No rigidity, guarding. No CVA tenderness noted bilaterally.   Musculoskeletal:        General: Normal range of motion.     Cervical back: Full passive range of motion without pain.       Back:     Comments: Moderate midline tenderness palpation of the L and T-spine no focal bony tenderness palpation however.  No step-off or bruising or deformity.  There is some lumbar muscular tenderness.  This seems to be bilateral and symmetric.  No overlying warmth erythema or edema no fluctuance.  Skin:    General: Skin is warm and dry.     Capillary Refill: Capillary refill takes less than 2 seconds.  Neurological:     Mental Status: He is alert and oriented to person, place, and time.     Comments: Follows commands, Moves all extremities  5/5 strength to BUE and BLE  Sensation intact grossly in bilateral lower extremities Psychiatric:        Speech: Speech normal.    ED Course/Procedures     .Critical Care Performed by: , PA Authorized by: Gailen Shelter, PA   Critical care provider statement:  Critical care time (minutes):  35   Critical care time was exclusive of:  Separately billable procedures and treating other patients and teaching time   Critical care was time spent personally by me on the following activities:  Discussions with consultants, evaluation of patient's response to treatment, examination of patient, review of old charts, re-evaluation of patient's condition, pulse oximetry, ordering and review of radiographic studies, ordering and review of laboratory studies and ordering and performing treatments and interventions   I assumed direction of critical care for this patient from another provider in my specialty: no   Comments:     Deep space infection requiring admission and surgery.   Results for orders placed or performed during the hospital encounter of  12/04/20  Comprehensive metabolic panel  Result Value Ref Range   Sodium 133 (L) 135 - 145 mmol/L   Potassium 4.2 3.5 - 5.1 mmol/L   Chloride 101 98 - 111 mmol/L   CO2 23 22 - 32 mmol/L   Glucose, Bld 348 (H) 70 - 99 mg/dL   BUN 13 6 - 20 mg/dL   Creatinine, Ser 7.12 0.61 - 1.24 mg/dL   Calcium 9.4 8.9 - 45.8 mg/dL   Total Protein 8.3 (H) 6.5 - 8.1 g/dL   Albumin 3.0 (L) 3.5 - 5.0 g/dL   AST 14 (L) 15 - 41 U/L   ALT 20 0 - 44 U/L   Alkaline Phosphatase 104 38 - 126 U/L   Total Bilirubin 0.3 0.3 - 1.2 mg/dL   GFR, Estimated >09 >98 mL/min   Anion gap 9 5 - 15  CBC with Differential  Result Value Ref Range   WBC 10.9 (H) 4.0 - 10.5 K/uL   RBC 5.02 4.22 - 5.81 MIL/uL   Hemoglobin 13.2 13.0 - 17.0 g/dL   HCT 33.8 25.0 - 53.9 %   MCV 80.9 80.0 - 100.0 fL   MCH 26.3 26.0 - 34.0 pg   MCHC 32.5 30.0 - 36.0 g/dL   RDW 76.7 34.1 - 93.7 %   Platelets 462 (H) 150 - 400 K/uL   nRBC 0.0 0.0 - 0.2 %   Neutrophils Relative % 73 %   Neutro Abs 7.9 (H) 1.7 - 7.7 K/uL   Lymphocytes Relative 19 %   Lymphs Abs 2.1 0.7 - 4.0 K/uL   Monocytes Relative 7 %   Monocytes Absolute 0.7 0.1 - 1.0 K/uL   Eosinophils Relative 0 %   Eosinophils Absolute 0.0 0.0 - 0.5 K/uL   Basophils Relative 1 %   Basophils Absolute 0.1 0.0 - 0.1 K/uL   Immature Granulocytes 0 %   Abs Immature Granulocytes 0.02 0.00 - 0.07 K/uL  Lipase, blood  Result Value Ref Range   Lipase 24 11 - 51 U/L   CT ABDOMEN PELVIS WO CONTRAST  Result Date: 12/04/2020 CLINICAL DATA:  Abdominal pain, acute, nonlocalized. EXAM: CT ABDOMEN AND PELVIS WITHOUT CONTRAST TECHNIQUE: Multidetector CT imaging of the abdomen and pelvis was performed following the standard protocol without IV contrast. COMPARISON:  None. FINDINGS: Lower chest: Minimal atelectasis is present at the bases. No nodule or mass lesion is present. No significant airspace consolidation is present. Heart size is normal. No significant pleural or pericardial effusion is  present. Hepatobiliary: No focal liver abnormality is seen. No gallstones, gallbladder wall thickening, or biliary dilatation. Pancreas: Unremarkable. No pancreatic ductal dilatation or surrounding inflammatory changes. Spleen: Normal in size without focal abnormality. Adrenals/Urinary Tract: Adrenal glands are normal bilaterally. A 2-3 mm nonobstructing stone  is present at the lower pole of the left kidney. No other significant nephrolithiasis is present. Chest mass lesion or obstruction is present. Ureters are within normal limits. The urinary bladder is normal. Stomach/Bowel: Stomach and duodenum are within normal limits. Small bowel is unremarkable. Terminal ileum is within normal limits. Appendix is visualized and within normal limits. Extends into the anatomic pelvis. The ascending and transverse colon are normal. The descending and sigmoid colon are unremarkable. Vascular/Lymphatic: No significant vascular findings are present. No enlarged abdominal or pelvic lymph nodes. Reproductive: Prostate is unremarkable. Other: No abdominal wall hernia or abnormality. No abdominopelvic ascites. Musculoskeletal: Vertebral body heights and alignment are normal. Lucency noted at the inferior left aspect of the L2 vertebral body. Asymmetric ossification is present along the left side of the disc margin. No other focal lucencies are evident. IMPRESSION: 1. Focal lucency to along the left inferior aspect of the L2 vertebral body with extension laterally raises concern for metastatic disease. Recommend MRI of the lumbar spine without and with contrast. No other focal osseous lesions are present. No primary lesion is evident. 2. No other acute or focal lesion to explain the patient's abdominal pain. 3. 2-3 mm nonobstructing stone at the lower pole of the left kidney. Electronically Signed   By: Marin Roberts M.D.   On: 12/04/2020 11:47   MR LUMBAR SPINE WO CONTRAST  Result Date: 12/04/2020 CLINICAL DATA:  Low back  pain.  Abnormal CT lumbar spine EXAM: MRI LUMBAR SPINE WITHOUT CONTRAST TECHNIQUE: Multiplanar, multisequence MR imaging of the lumbar spine was performed. No intravenous contrast was administered. COMPARISON:  CT lumbar spine 12/04/2020 FINDINGS: Segmentation:  Normal Alignment:  Normal Vertebrae: There is edema in the L2 vertebral body on the left extending to the endplate. There is a smaller area of edema in the superior endplate of L3 on the left. The endplates appear eroded on the left at L2-3. There is extensive edema in the left psoas muscle which is enlarged and hyperintense on T2. There are small loculated fluid collections in the left psoas muscle suggestive of abscess. No epidural abscess. Postcontrast imaging recommended for further evaluation. No other areas of bone marrow edema. Conus medullaris and cauda equina: Conus extends to the L1-2 level. Conus and cauda equina appear normal. Paraspinal and other soft tissues: Left psoas muscle is asymmetrically enlarged and edematous with loculated small fluid collections in the left psoas muscle. Right psoas muscle is normal. Disc levels: L1-2: Negative L2-3: Mild disc space narrowing. Endplate erosion on the left with edema extending into the L2 and L3 vertebral bodies. Right side of the disc space appears normal. No significant spinal stenosis. L3-4: Negative L4-5: Mild disc and mild facet degeneration.  Negative for stenosis L5-S1: Mild disc bulging and mild facet degeneration. Mild subarticular and foraminal stenosis bilaterally. IMPRESSION: Endplate erosions at L2-3 on the left. There is extensive bone marrow edema on the left at L2 and a smaller area of edema in the superior bone marrow at L3 on the left. This extends into the left psoas muscle which is enlarged, edematous, with multiple loculated fluid collections. Findings most likely due to discitis rather than tumor. Postcontrast imaging is recommended to further evaluate the abscesses and evaluate  for epidural abscess which is not definitely seen on the unenhanced study. These results were called by telephone at the time of interpretation on 12/04/2020 at 4:52 pm to provider PA, who verbally acknowledged these results. Electronically Signed   By: Marlan Palau M.D.  On: 12/04/2020 16:53   CT L-SPINE NO CHARGE  Result Date: 12/04/2020 CLINICAL DATA:  Low back pain several weeks. EXAM: CT LUMBAR SPINE WITHOUT CONTRAST TECHNIQUE: Multidetector CT imaging of the lumbar spine was performed without intravenous contrast administration. Multiplanar CT image reconstructions were also generated. COMPARISON:  CT abdomen pelvis 12/04/2020 FINDINGS: Segmentation: Normal Alignment: Normal Vertebrae: There is bony destruction of the inferior L2 vertebral body on the left extending to the disc space. There is also mild erosion of the superior endplate of L3 on the left. Small amount of calcification extends lateral from the disc space into the left psoas muscle. No fracture is identified. No other skeletal lesion identified. Paraspinal and other soft tissues: No paraspinous fluid collection identified. Calcification lateral to the L2-3 disc space on the left extends into the psoas muscle which is likely due to bony destruction. Disc levels: L1-2: Negative L2-3: Mild disc space narrowing. Erosive endplate changes are present on the left at L2-3 primarily at L2 with bony destruction extending well into the left L2 vertebral body on the left. Calcification extending into the left psoas muscle. Mild left foraminal narrowing. L3-4: Mild disc bulging and mild facet degeneration. No significant stenosis. L4-5: Mild disc bulging and mild to moderate facet degeneration bilaterally. 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 There is mild erosion of the superior endplate of L3. There appears to be bone extending into the left  psoas muscle which is likely due to destructive mass lesion. Favor metastatic disease although infection is a consideration. Recommend MRI lumbar spine without with contrast. No significant spinal stenosis. Electronically Signed   By: Marlan Palauharles  Clark M.D.   On: 12/04/2020 11:33   DG Foot Complete Right  Result Date: 11/21/2020 CLINICAL DATA:  45 year old male with insect bite to the right foot. EXAM: RIGHT FOOT COMPLETE - 3+ VIEW COMPARISON:  None. FINDINGS: There is no acute fracture or dislocation. The bones are well mineralized. No significant arthritic changes. Mild diffuse skin thickening. No radiopaque foreign object or soft tissue gas. IMPRESSION: Negative. Electronically Signed   By: Elgie CollardArash  Radparvar M.D.   On: 11/21/2020 02:19    MDM   4:49 PM received call from radiology who informed that there is tumor versus infection.  We will order MRI with contrast.  Patient given 1 dose of Dilaudid given his intractable pain.  He appears quite uncomfortable has normal vital signs however.  Is afebrile.  No history of IV drug use.  I confirmed this with patient.  MRI with contrast ordered.  Discussed with radiology tech   5:26 PM patient will be next patient to MRI.   MRI with discitis vs myositis  IMPRESSION:  1. Postcontrast images of the lumbar spine are consistent with  discitis-osteomyelitis at the L2-3 level. No epidural phlegmon or  abscess.  2. Asymmetric enlargement of the left psoas muscle centered at the  L2-3 level with diffuse intramuscular enhancement. Fluid signal  within the left psoas muscle without definite peripherally enhancing  collection suggests myositis with phlegmon.     Discussed with Dr. Johnsie Cancelstergaard who recommended hospitalist admission. Consult placed for hospitalist.  Patient will be n.p.o. after midnight, hold antibiotics at this time.  The antibiotics of vancomycin were held none were administered and bedside RN is aware of plan to not administer any  antibiotics.  Blood cultures are pending but have been obtained.  Patient is understanding of plan.   Blanchie DessertFondaw,  Rodrigo Ran, PA 12/04/20 2140    Jacalyn Lefevre, MD 12/05/20 1130

## 2020-12-04 NOTE — ED Notes (Signed)
Patient transported to CT 

## 2020-12-04 NOTE — Progress Notes (Signed)
Pharmacy Antibiotic Note  Jeffrey Webb is a 45 y.o. male admitted on 12/04/2020 presenting with back pain, lumbar MRI favors discitis, concern for epidural abscess.  Pharmacy has been consulted for vancomycin dosing.  Plan: Vancomycin 1750 mg IV x 1, then 1000 mg IV q8h (Target vancomycin trough 15-20) Monitor renal function, discitis workup and clinical progression Vancomycin levels as needed  Height: 5\' 6"  (167.6 cm) Weight: 88.5 kg (195 lb) IBW/kg (Calculated) : 63.8  Temp (24hrs), Avg:98.1 F (36.7 C), Min:97.9 F (36.6 C), Max:98.5 F (36.9 C)  Recent Labs  Lab 12/04/20 0915  WBC 10.9*  CREATININE 0.98    Estimated Creatinine Clearance: 100.3 mL/min (by C-G formula based on SCr of 0.98 mg/dL).    No Known Allergies  12/06/20, PharmD Clinical Pharmacist ED Pharmacist Phone # [phone] 12/04/2020 8:10 PM

## 2020-12-04 NOTE — Consult Note (Signed)
Neurosurgery Consultation  Reason for Consult: Lumbar osteomyelitis Referring Physician: Marikay Alar  CC: Low back pain  HPI: This is a 45 y.o. man that presents with severe progressive low back pain. He also has some right foot tenderness from a recently treated abscess. The LBP is sharp in quality, severe in intensity, worse with any movement, unable to find a comfortable position, mildly improved with change in position in the bed. No radiation into the lower extremities, no new new weakness, numbness, or parasthesias, no recent change in bowel or bladder function.   ROS: A 14 point ROS was performed and is negative except as noted in the HPI.   PMHx: History reviewed. No pertinent past medical history. FamHx: No family history on file. SocHx:  reports that he has been smoking. He has never used smokeless tobacco. He reports previous alcohol use. He reports previous drug use.  Exam: Vital signs in last 24 hours: Temp:  [98.2 F (36.8 C)-98.7 F (37.1 C)] 98.4 F (36.9 C) (05/22 1829) Pulse Rate:  [58-101] 58 (05/22 1829) Resp:  [14-25] 22 (05/22 1829) BP: (108-145)/(67-108) 108/67 (05/22 1829) SpO2:  [95 %-99 %] 99 % (05/22 1829) General: Awake, alert, cooperative, lying in bed, appears uncomfortable Head: Normocephalic and atruamatic HEENT: Neck supple Pulmonary: breathing room air comfortably, no evidence of increased work of breathing Cardiac: RRR Abdomen: S NT ND Extremities: +RLE severe foot tenderness Neuro: AOx3, PERRL, EOMI, FS Strength pain limited but appears 5/5 x4, SILTx4 but unable to assess sensation in distal RLE due to pain  Assessment and Plan: 45 y.o. man w/ progressive severe LBP. 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   -no acute neurosurgical intervention indicated at this time -recommend IR guided disc space biopsy or psoas abscess drainage to guide ABx therapy -please call with any  concerns or questions  Jadene Pierini, MD 12/05/20 6:32 PM [facility]

## 2020-12-05 DIAGNOSIS — M4646 Discitis, unspecified, lumbar region: Secondary | ICD-10-CM

## 2020-12-05 DIAGNOSIS — E201 Pseudohypoparathyroidism: Secondary | ICD-10-CM

## 2020-12-05 DIAGNOSIS — E119 Type 2 diabetes mellitus without complications: Secondary | ICD-10-CM

## 2020-12-05 DIAGNOSIS — K6812 Psoas muscle abscess: Secondary | ICD-10-CM

## 2020-12-05 LAB — BLOOD CULTURE ID PANEL (REFLEXED) - BCID2

## 2020-12-05 LAB — BASIC METABOLIC PANEL
Anion gap: 8 (ref 5–15)
BUN: 15 mg/dL (ref 6–20)
CO2: 26 mmol/L (ref 22–32)
Calcium: 8.8 mg/dL — ABNORMAL LOW (ref 8.9–10.3)
Chloride: 101 mmol/L (ref 98–111)
Creatinine, Ser: 0.91 mg/dL (ref 0.61–1.24)
GFR, Estimated: >60 mL/min (ref >60)
Glucose, Bld: 285 mg/dL — ABNORMAL HIGH (ref 70–99)
Potassium: 4 mmol/L (ref 3.5–5.1)
Sodium: 135 mmol/L (ref 135–145)

## 2020-12-05 LAB — URINE CULTURE: Culture: NO GROWTH

## 2020-12-05 LAB — CBG MONITORING, ED
Glucose-Capillary: 377 mg/dL — ABNORMAL HIGH (ref 70–99)
Glucose-Capillary: 301 mg/dL — ABNORMAL HIGH (ref 70–99)
Glucose-Capillary: 249 mg/dL — ABNORMAL HIGH (ref 70–99)

## 2020-12-05 LAB — CBC
HCT: 39.7 % (ref 39.0–52.0)
Hemoglobin: 12.7 g/dL — ABNORMAL LOW (ref 13.0–17.0)
MCH: 26.4 pg (ref 26.0–34.0)
MCHC: 32 g/dL (ref 30.0–36.0)
MCV: 82.5 fL (ref 80.0–100.0)
Platelets: 437 10*3/uL — ABNORMAL HIGH (ref 150–400)
RBC: 4.81 MIL/uL (ref 4.22–5.81)
RDW: 12.6 % (ref 11.5–15.5)
WBC: 8.5 10*3/uL (ref 4.0–10.5)
nRBC: 0 % (ref 0.0–0.2)

## 2020-12-05 LAB — MAGNESIUM: Magnesium: 2 mg/dL (ref 1.7–2.4)

## 2020-12-05 LAB — PHOSPHORUS: Phosphorus: 4.2 mg/dL (ref 2.5–4.6)

## 2020-12-05 LAB — HIV ANTIBODY (ROUTINE TESTING W REFLEX): HIV Screen 4th Generation wRfx: NONREACTIVE

## 2020-12-05 LAB — GLUCOSE, CAPILLARY: Glucose-Capillary: 294 mg/dL — ABNORMAL HIGH (ref 70–99)

## 2020-12-05 LAB — HEMOGLOBIN A1C
Hgb A1c MFr Bld: 14.3 % — ABNORMAL HIGH (ref 4.8–5.6)
Mean Plasma Glucose: 363.71 mg/dL

## 2020-12-05 MED ORDER — CEFAZOLIN SODIUM-DEXTROSE 2-4 GM/100ML-% IV SOLN
2.0000 g | Freq: Three times a day (TID) | INTRAVENOUS | Status: DC
  Administered 2020-12-05 – 2020-12-23 (×55): 2 g via INTRAVENOUS
  Filled 2020-12-05 (×59): qty 100

## 2020-12-05 NOTE — Consult Note (Signed)
[facility]       Reason for Consult: Staph aureus bacteremia    Referring Physician: CHAMP autoconsult  Active Problems:   Discitis of lumbar region   Psoas abscess, left (HCC)   Type 2 diabetes mellitus with complication, without long-term current use of insulin (HCC)   Substance abuse (HCC)   Pseudohyponatremia   . insulin aspart  0-15 Units Subcutaneous TID WC  . insulin aspart  0-5 Units Subcutaneous QHS  . multivitamin with minerals  1 tablet Oral Daily  . nicotine  14 mg Transdermal Daily    Recommendations: Cefazolin Repeat blood cultures  Assessment: He has extensive discitis/osteomyelitis with MSSA bacteremia.  Will need a prolonged course.  With cocaine positive urine, he will not be a home IV antibiotic candidate.  Antibiotics appropriately held prior to aspiration but IR with no plans to aspirate and now with an identified organism, will start.    Antibiotics: Staring cefazolin  HPI: Jeffrey Webb is a 45 y.o. male with a history of drug use, cocaine positive on admission, tobacco use presented with acute onset back pain. MRI notable for discitis-osteomyelitis of L2-3 with endplate erosion, marrow enchancement throughout the L2 vertebral body extending into the left pedicle and in the L3 vertebral body.  No fever or chills.  WBC wnl.  IR consulted but not enough fluid to target for drainage.     Review of Systems:  Constitutional: negative for fevers and chills Integument/breast: negative for rash All other systems reviewed and are negative    PMH: substance abuse  Social History   Tobacco Use  . Smoking status: Current Some Day Smoker  . Smokeless tobacco: Never Used  Substance Use Topics  . Alcohol use: Not Currently  . Drug use: Not Currently    FH: cardiac disease  No Known Allergies  Physical Exam: Constitutional: in no apparent distress  Vitals:   12/05/20 1030 12/05/20 1439  BP: (!) 145/105 (!) 129/100   Pulse: 93 97  Resp: 15 20  Temp:  98.2 F (36.8 C)  SpO2: 99% 99%   EYES: anicteric Cardiovascular: Cor RRR Respiratory: clear; GI: Bowel sounds are normal, liver is not enlarged, spleen is not enlarged Musculoskeletal: no pedal edema noted Skin: negatives: no rash  Lab Results  Component Value Date   WBC 8.5 12/05/2020   HGB 12.7 (L) 12/05/2020   HCT 39.7 12/05/2020   MCV 82.5 12/05/2020   PLT 437 (H) 12/05/2020    Lab Results  Component Value Date   CREATININE 0.91 12/05/2020   BUN 15 12/05/2020   NA 135 12/05/2020   K 4.0 12/05/2020   CL 101 12/05/2020   CO2 26 12/05/2020    Lab Results  Component Value Date   ALT 20 12/04/2020   AST 14 (L) 12/04/2020   ALKPHOS 104 12/04/2020     Microbiology: Recent Results (from the past 240 hour(s))  Urine C&S     Status: None   Collection Time: 12/04/20  5:06 AM   Specimen: Urine, Clean Catch  Result Value Ref Range Status   Specimen Description URINE, CLEAN CATCH  Final   Special Requests NONE  Final   Culture   Final    NO GROWTH Performed at [facility], [address], [place], [place] [address]    Report Status 12/05/2020 FINAL  Final  Resp Panel by RT-PCR (Flu A&B, Covid) Nasopharyngeal Swab     Status: None   Collection Time: 12/04/20  6:42 PM   Specimen: Nasopharyngeal Swab; Nasopharyngeal(NP) swabs in vial transport medium  Result Value Ref Range Status   SARS Coronavirus 2 by RT PCR NEGATIVE NEGATIVE Final    Comment: (NOTE) SARS-CoV-2 target nucleic acids are NOT DETECTED.  The SARS-CoV-2 RNA is generally detectable in upper respiratory specimens during the acute phase of infection. The lowest concentration of SARS-CoV-2 viral copies this assay can detect is 138 copies/mL. A negative result does not preclude SARS-Cov-2 infection and should not be used as the sole basis for treatment or other patient management decisions. A negative result may occur with  improper specimen collection/handling,  submission of specimen other than nasopharyngeal swab, presence of viral mutation(s) within the areas targeted by this assay, and inadequate number of viral copies(<138 copies/mL). A negative result must be combined with clinical observations, patient history, and epidemiological information. The expected result is Negative.  Fact Sheet for Patients:  BloggerCourse.com  Fact Sheet for Healthcare Providers:  SeriousBroker.it  This test is no t yet approved or cleared by the [place] FDA and  has been authorized for detection and/or diagnosis of SARS-CoV-2 by FDA under an Emergency Use Authorization (EUA). This EUA will remain  in effect (meaning this test can be used) for the duration of the COVID-19 declaration under Section 564(b)(1) of the Act, 21 U.S.C.section 360bbb-3(b)(1), unless the authorization is terminated  or revoked sooner.       Influenza A by PCR NEGATIVE NEGATIVE Final   Influenza B by PCR NEGATIVE NEGATIVE Final    Comment: (NOTE) The Xpert Xpress SARS-CoV-2/FLU/RSV plus assay is intended as an aid in the diagnosis of influenza from Nasopharyngeal swab specimens and should not be used as a sole basis for treatment. Nasal washings and aspirates are unacceptable for Xpert Xpress SARS-CoV-2/FLU/RSV testing.  Fact Sheet for Patients: BloggerCourse.com  Fact Sheet for Healthcare Providers: SeriousBroker.it  This test is not yet approved or cleared by the [place] FDA and has been authorized for detection and/or diagnosis of SARS-CoV-2 by FDA under an Emergency Use Authorization (EUA). This EUA will remain in effect (meaning this test can be used) for the duration of the COVID-19 declaration under Section 564(b)(1) of the Act, 21 U.S.C. section 360bbb-3(b)(1), unless the authorization is terminated or revoked.  Performed at [facility], [address] [place], [place] [address]   Blood culture (routine x 2)     Status: None (Preliminary result)   Collection Time: 12/04/20  8:46 PM   Specimen: BLOOD LEFT HAND  Result Value Ref Range Status   Specimen Description BLOOD LEFT HAND  Final   Special Requests   Final    BOTTLES DRAWN AEROBIC AND ANAEROBIC Blood Culture adequate volume   Culture  Setup Time   Final    GRAM POSITIVE COCCI AEROBIC BOTTLE ONLY CRITICAL VALUE NOTED.  VALUE IS CONSISTENT WITH PREVIOUSLY REPORTED AND CALLED VALUE. Performed at [facility], [address], [place], [place] [address]    Culture GRAM POSITIVE COCCI  Final   Report Status PENDING  Incomplete  Blood culture (routine x 2)     Status: None (Preliminary result)   Collection Time: 12/04/20  8:59 PM   Specimen: BLOOD  Result Value Ref Range Status   Specimen Description BLOOD LEFT ANTECUBITAL  Final   Special Requests   Final    BOTTLES DRAWN AEROBIC AND ANAEROBIC Blood Culture adequate volume   Culture  Setup Time   Final    GRAM  POSITIVE COCCI IN BOTH AEROBIC AND ANAEROBIC BOTTLES CRITICAL RESULT CALLED TO, READ BACK BY AND VERIFIED WITH: [name] PHARMD @1600  12/05/20 EB Performed at [facility], [address], [place], [place]    Culture GRAM POSITIVE COCCI  Final   Report Status PENDING  Incomplete  Blood Culture ID Panel (Reflexed)     Status: Abnormal   Collection Time: 12/04/20  8:59 PM  Result Value Ref Range Status   Enterococcus faecalis NOT DETECTED NOT DETECTED Final   Enterococcus Faecium NOT DETECTED NOT DETECTED Final   Listeria monocytogenes NOT DETECTED NOT DETECTED Final   Staphylococcus species DETECTED (A) NOT DETECTED Final    Comment: CRITICAL RESULT CALLED TO, READ BACK BY AND VERIFIED WITH: ANDREW MEYER PHARMD @1600  12/05/20 EB    Staphylococcus aureus (BCID) DETECTED (A) NOT DETECTED Final    Comment: CRITICAL RESULT CALLED TO, READ BACK BY AND VERIFIED WITH: ANDREW MEYER PHARMD  @1600  12/05/20 EB    Staphylococcus epidermidis NOT DETECTED NOT DETECTED Final   Staphylococcus lugdunensis NOT DETECTED NOT DETECTED Final   Streptococcus species NOT DETECTED NOT DETECTED Final   Streptococcus agalactiae NOT DETECTED NOT DETECTED Final   Streptococcus pneumoniae NOT DETECTED NOT DETECTED Final   Streptococcus pyogenes NOT DETECTED NOT DETECTED Final   A.calcoaceticus-baumannii NOT DETECTED NOT DETECTED Final   Bacteroides fragilis NOT DETECTED NOT DETECTED Final   Enterobacterales NOT DETECTED NOT DETECTED Final   Enterobacter cloacae complex NOT DETECTED NOT DETECTED Final   Escherichia coli NOT DETECTED NOT DETECTED Final   Klebsiella aerogenes NOT DETECTED NOT DETECTED Final   Klebsiella oxytoca NOT DETECTED NOT DETECTED Final   Klebsiella pneumoniae NOT DETECTED NOT DETECTED Final   Proteus species NOT DETECTED NOT DETECTED Final   Salmonella species NOT DETECTED NOT DETECTED Final   Serratia marcescens NOT DETECTED NOT DETECTED Final   Haemophilus influenzae NOT DETECTED NOT DETECTED Final   Neisseria meningitidis NOT DETECTED NOT DETECTED Final   Pseudomonas aeruginosa NOT DETECTED NOT DETECTED Final   Stenotrophomonas maltophilia NOT DETECTED NOT DETECTED Final   Candida albicans NOT DETECTED NOT DETECTED Final   Candida auris NOT DETECTED NOT DETECTED Final   Candida glabrata NOT DETECTED NOT DETECTED Final   Candida krusei NOT DETECTED NOT DETECTED Final   Candida parapsilosis NOT DETECTED NOT DETECTED Final   Candida tropicalis NOT DETECTED NOT DETECTED Final   Cryptococcus neoformans/gattii NOT DETECTED NOT DETECTED Final   Meth resistant mecA/C and MREJ NOT DETECTED NOT DETECTED Final    Comment: Performed at [facility], [address] [place], [facility] [address]    [place], MD [facility] [email or web address] 12/05/2020, 4:23 PM

## 2020-12-05 NOTE — ED Notes (Signed)
Lunch tray given. 

## 2020-12-05 NOTE — ED Notes (Signed)
Attempted to call report, Diplomatic Services operational officer forwarded call to nurse, Zella Ball. RN advised she was in pt's room currently and would call back.

## 2020-12-05 NOTE — Progress Notes (Signed)
PHARMACY - PHYSICIAN COMMUNICATION CRITICAL VALUE ALERT - BLOOD CULTURE IDENTIFICATION (BCID)  Jeffrey Webb is an 45 y.o. male who presented to [facility] on 12/04/2020   Assessment: He is noted with L2-L3  Discitis/osteomyelitis and plans for biopsy. Blood cultures show GPC in 2/3 bottles and BCID with staph aureus (MEC negative).   Current antibiotics: none  Changes to prescribed antibiotics recommended:  -Add ancef per consult order  Results for orders placed or performed during the hospital encounter of 12/04/20  Blood Culture ID Panel (Reflexed) (Collected: 12/04/2020  8:59 PM)  Result Value Ref Range   Enterococcus faecalis NOT DETECTED NOT DETECTED   Enterococcus Faecium NOT DETECTED NOT DETECTED   Listeria monocytogenes NOT DETECTED NOT DETECTED   Staphylococcus species DETECTED (A) NOT DETECTED   Staphylococcus aureus (BCID) DETECTED (A) NOT DETECTED   Staphylococcus epidermidis NOT DETECTED NOT DETECTED   Staphylococcus lugdunensis NOT DETECTED NOT DETECTED   Streptococcus species NOT DETECTED NOT DETECTED   Streptococcus agalactiae NOT DETECTED NOT DETECTED   Streptococcus pneumoniae NOT DETECTED NOT DETECTED   Streptococcus pyogenes NOT DETECTED NOT DETECTED   A.calcoaceticus-baumannii NOT DETECTED NOT DETECTED   Bacteroides fragilis NOT DETECTED NOT DETECTED   Enterobacterales NOT DETECTED NOT DETECTED   Enterobacter cloacae complex NOT DETECTED NOT DETECTED   Escherichia coli NOT DETECTED NOT DETECTED   Klebsiella aerogenes NOT DETECTED NOT DETECTED   Klebsiella oxytoca NOT DETECTED NOT DETECTED   Klebsiella pneumoniae NOT DETECTED NOT DETECTED   Proteus species NOT DETECTED NOT DETECTED   Salmonella species NOT DETECTED NOT DETECTED   Serratia marcescens NOT DETECTED NOT DETECTED   Haemophilus influenzae NOT DETECTED NOT DETECTED   Neisseria meningitidis NOT DETECTED NOT DETECTED   Pseudomonas aeruginosa NOT DETECTED NOT DETECTED   Stenotrophomonas maltophilia  NOT DETECTED NOT DETECTED   Candida albicans NOT DETECTED NOT DETECTED   Candida auris NOT DETECTED NOT DETECTED   Candida glabrata NOT DETECTED NOT DETECTED   Candida krusei NOT DETECTED NOT DETECTED   Candida parapsilosis NOT DETECTED NOT DETECTED   Candida tropicalis NOT DETECTED NOT DETECTED   Cryptococcus neoformans/gattii NOT DETECTED NOT DETECTED   Meth resistant mecA/C and MREJ NOT DETECTED NOT DETECTED    Jeffrey Webb, PharmD Clinical Pharmacist **Pharmacist phone directory can now be found on [email or web address] ([email or web address]).  Listed under [facility].

## 2020-12-05 NOTE — ED Notes (Signed)
Report received from Kami B. RN at this time

## 2020-12-05 NOTE — ED Notes (Signed)
Attempted to call report, secretary took down number advising nurse would call back to get a report.

## 2020-12-05 NOTE — Progress Notes (Addendum)
Received pt from ED. Pt GCS 15, pulses and motor sensation intact, pt reports no improvement in pain with pain med given in ED, reports current pain at 7/10 right flank area.  Pt significant other at bedside assisting pt with brushing his teeth

## 2020-12-05 NOTE — Progress Notes (Signed)
Request to IR for for aspiration of psoas abscess and/or disc aspiration for possible L2-3 discitis/osteomyelities seen on MRI lumbar spine.  Patient history and imaging reviewed by IR attending, Dr. Archer Asa, who notes no targetable fluid collection. The area in question appears to be all edema/phelgmon and is not something that can be aspirated. No procedure planned in IR.  Above was relayed to Dr. Natale Milch via secure chat. Order will be cancelled - please place a new consult order if patient requires re-examination.  Lynnette Caffey, PA-C

## 2020-12-05 NOTE — Progress Notes (Signed)
PROGRESS NOTE    Jeffrey Webb  OXB:[id number] DOB: Jun 16, 1976 DOA: 12/04/2020 PCP: Patient, No Pcp Per (Inactive)   Brief Narrative:  Jeffrey Webb is a 45 y.o. male, with PMH of type 2 diabetes, substance abuse, tobacco abuse, who presented to the ER on 12/04/2020 with back pain for over 3 weeks. Patient states he has had ongoing back pain for the past 3 weeks.  The pain came on all of a sudden, denies any trauma, injury or fall.  Pain is worse with movement, but he has been ambulating okay. [facility]: Imaging obtained on admission: MRI and CT of abdomen pelvis and spine demonstrating findings concerning with L2 discitis and/or osteomyelitis and questionable psoas abscess. Neurosurgery was consulted - recommending IR guided biopsy and hold abx unless blood cultures return positive preliminarily.   Assessment & Plan:   Active Problems:   Discitis of lumbar region   Psoas abscess, left (HCC)   Type 2 diabetes mellitus with complication, without long-term current use of insulin (HCC)   Substance abuse (HCC)   Pseudohyponatremia   Acute discitis and/or osteomyelitis Likely concurrent left psoas/deep tissue abscess - Imaging above demonstrated L2-L3 discitis/osteomyelitis - Neurosurgery contacted by the ER, recommend IR biopsy versus abscess drainage -IR indicates there is no fluid collection to drain -we will follow-up with neurosurgery for repeat recommendations - Continue to hold antibiotics until formal decision to be made in regards to biopsy - N.p.o. at midnight - Blood cultures obtained on admission - Pain controlled with morphine and oxycodone IR  Uncontrolled type 2 diabetes with hyperglycemia - Glucose on admission 348 - A1c 14.3 -Continue sliding scale insulin, hypoglycemic protocol  Illicit substance abuse -Urine drug screen positive for cocaine - Recommend cessation  Tobacco abuse -Lengthy discussion on cessation - Nicotine patch  prescribed  Pseudohyponatremia -Secondary to hyperglycemia -Follow repeat labs  DVT prophylaxis: SCDs, early ambulation holding chemical prophylaxis for possible procedure Code Status: Full Family Communication: At bedside  Status is: Inpatient  Dispo: The patient is from: Home              Anticipated d/c is to: To be determined              Anticipated d/c date is: Pending clinical work-up              Patient currently not medically stable for discharge  Consultants:   Neurosurgery, interventional radiology  Procedures:   Pending evaluation as above possible biopsy versus fluid drain  Antimicrobials:  Holding pending biopsy  Subjective: No acute issues or events overnight, pain moderately well controlled today  Objective: Vitals:   12/05/20 0515 12/05/20 0630 12/05/20 0730 12/05/20 0745  BP: (!) 126/92 (!) 126/92 (!) 123/91 (!) 125/95  Pulse: 88 82 81 83  Resp: 14 15 15 17   Temp:      TempSrc:      SpO2: 96% 97% 95% 95%  Weight:      Height:       No intake or output data in the 24 hours ending 12/05/20 0814 Filed Weights   12/04/20 0800  Weight: 88.5 kg    Examination:  General:  Pleasantly resting in bed, No acute distress. HEENT:  Normocephalic atraumatic.  Sclerae nonicteric, noninjected.  Extraocular movements intact bilaterally. Neck:  Without mass or deformity.  Trachea is midline. Lungs:  Clear to auscultate bilaterally without rhonchi, wheeze, or rales. Heart:  Regular rate and rhythm.  Without murmurs, rubs, or gallops. Abdomen:  Soft, nontender, nondistended.  Without guarding or rebound. Extremities: Without cyanosis, clubbing, edema, or obvious deformity. Vascular:  Dorsalis pedis and posterior tibial pulses palpable bilaterally. Skin:  Warm and dry, no erythema, no ulcerations.   Data Reviewed: I have personally reviewed following labs and imaging studies  CBC: Recent Labs  Lab 12/04/20 0915 12/05/20 0353  WBC 10.9* 8.5   NEUTROABS 7.9*  --   HGB 13.2 12.7*  HCT 40.6 39.7  MCV 80.9 82.5  PLT 462* 437*   Basic Metabolic Panel: Recent Labs  Lab 12/04/20 0915 12/05/20 0353  NA 133* 135  K 4.2 4.0  CL 101 101  CO2 23 26  GLUCOSE 348* 285*  BUN 13 15  CREATININE 0.98 0.91  CALCIUM 9.4 8.8*  MG  --  2.0  PHOS  --  4.2   GFR: Estimated Creatinine Clearance: 108 mL/min (by C-G formula based on SCr of 0.91 mg/dL). Liver Function Tests: Recent Labs  Lab 12/04/20 0915  AST 14*  ALT 20  ALKPHOS 104  BILITOT 0.3  PROT 8.3*  ALBUMIN 3.0*   Recent Labs  Lab 12/04/20 0915  LIPASE 24   No results for input(s): AMMONIA in the last 168 hours. Coagulation Profile: No results for input(s): INR, PROTIME in the last 168 hours. Cardiac Enzymes: No results for input(s): CKTOTAL, CKMB, CKMBINDEX, TROPONINI in the last 168 hours. BNP (last 3 results) No results for input(s): PROBNP in the last 8760 hours. HbA1C: Recent Labs    12/05/20 0353  HGBA1C 14.3*   CBG: Recent Labs  Lab 12/05/20 0136  GLUCAP 377*   Lipid Profile: No results for input(s): CHOL, HDL, LDLCALC, TRIG, CHOLHDL, LDLDIRECT in the last 72 hours. Thyroid Function Tests: No results for input(s): TSH, T4TOTAL, FREET4, T3FREE, THYROIDAB in the last 72 hours. Anemia Panel: No results for input(s): VITAMINB12, FOLATE, FERRITIN, TIBC, IRON, RETICCTPCT in the last 72 hours. Sepsis Labs: No results for input(s): PROCALCITON, LATICACIDVEN in the last 168 hours.  Recent Results (from the past 240 hour(s))  Urine C&S     Status: None   Collection Time: 12/04/20  5:06 AM   Specimen: Urine, Clean Catch  Result Value Ref Range Status   Specimen Description URINE, CLEAN CATCH  Final   Special Requests NONE  Final   Culture   Final    NO GROWTH Performed at [facility], [address], [place], [place] [address]    Report Status 12/05/2020 FINAL  Final  Resp Panel by RT-PCR (Flu A&B, Covid) Nasopharyngeal Swab     Status:  None   Collection Time: 12/04/20  6:42 PM   Specimen: Nasopharyngeal Swab; Nasopharyngeal(NP) swabs in vial transport medium  Result Value Ref Range Status   SARS Coronavirus 2 by RT PCR NEGATIVE NEGATIVE Final    Comment: (NOTE) SARS-CoV-2 target nucleic acids are NOT DETECTED.  The SARS-CoV-2 RNA is generally detectable in upper respiratory specimens during the acute phase of infection. The lowest concentration of SARS-CoV-2 viral copies this assay can detect is 138 copies/mL. A negative result does not preclude SARS-Cov-2 infection and should not be used as the sole basis for treatment or other patient management decisions. A negative result may occur with  improper specimen collection/handling, submission of specimen other than nasopharyngeal swab, presence of viral mutation(s) within the areas targeted by this assay, and inadequate number of viral copies(<138 copies/mL). A negative result must be combined with clinical observations, patient history, and epidemiological information. The expected result is Negative.  Fact Sheet for  Patients:  BloggerCourse.com  Fact Sheet for Healthcare Providers:  SeriousBroker.it  This test is no t yet approved or cleared by the [place] FDA and  has been authorized for detection and/or diagnosis of SARS-CoV-2 by FDA under an Emergency Use Authorization (EUA). This EUA will remain  in effect (meaning this test can be used) for the duration of the COVID-19 declaration under Section 564(b)(1) of the Act, 21 U.S.C.section 360bbb-3(b)(1), unless the authorization is terminated  or revoked sooner.       Influenza A by PCR NEGATIVE NEGATIVE Final   Influenza B by PCR NEGATIVE NEGATIVE Final    Comment: (NOTE) The Xpert Xpress SARS-CoV-2/FLU/RSV plus assay is intended as an aid in the diagnosis of influenza from Nasopharyngeal swab specimens and should not be used as a sole basis for  treatment. Nasal washings and aspirates are unacceptable for Xpert Xpress SARS-CoV-2/FLU/RSV testing.  Fact Sheet for Patients: BloggerCourse.com  Fact Sheet for Healthcare Providers: SeriousBroker.it  This test is not yet approved or cleared by the [place] FDA and has been authorized for detection and/or diagnosis of SARS-CoV-2 by FDA under an Emergency Use Authorization (EUA). This EUA will remain in effect (meaning this test can be used) for the duration of the COVID-19 declaration under Section 564(b)(1) of the Act, 21 U.S.C. section 360bbb-3(b)(1), unless the authorization is terminated or revoked.  Performed at [facility], [address], [place], [place] [address]          Radiology Studies: CT ABDOMEN PELVIS WO CONTRAST  Result Date: 12/04/2020 CLINICAL DATA:  Abdominal pain, acute, nonlocalized. EXAM: CT ABDOMEN AND PELVIS WITHOUT CONTRAST TECHNIQUE: Multidetector CT imaging of the abdomen and pelvis was performed following the standard protocol without IV contrast. COMPARISON:  None. FINDINGS: Lower chest: Minimal atelectasis is present at the bases. No nodule or mass lesion is present. No significant airspace consolidation is present. Heart size is normal. No significant pleural or pericardial effusion is present. Hepatobiliary: No focal liver abnormality is seen. No gallstones, gallbladder wall thickening, or biliary dilatation. Pancreas: Unremarkable. No pancreatic ductal dilatation or surrounding inflammatory changes. Spleen: Normal in size without focal abnormality. Adrenals/Urinary Tract: Adrenal glands are normal bilaterally. A 2-3 mm nonobstructing stone is present at the lower pole of the left kidney. No other significant nephrolithiasis is present. Chest mass lesion or obstruction is present. Ureters are within normal limits. The urinary bladder is normal. Stomach/Bowel: Stomach and duodenum are within  normal limits. Small bowel is unremarkable. Terminal ileum is within normal limits. Appendix is visualized and within normal limits. Extends into the anatomic pelvis. The ascending and transverse colon are normal. The descending and sigmoid colon are unremarkable. Vascular/Lymphatic: No significant vascular findings are present. No enlarged abdominal or pelvic lymph nodes. Reproductive: Prostate is unremarkable. Other: No abdominal wall hernia or abnormality. No abdominopelvic ascites. Musculoskeletal: Vertebral body heights and alignment are normal. Lucency noted at the inferior left aspect of the L2 vertebral body. Asymmetric ossification is present along the left side of the disc margin. No other focal lucencies are evident. IMPRESSION: 1. Focal lucency to along the left inferior aspect of the L2 vertebral body with extension laterally raises concern for metastatic disease. Recommend MRI of the lumbar spine without and with contrast. No other focal osseous lesions are present. No primary lesion is evident. 2. No other acute or focal lesion to explain the patient's abdominal pain. 3. 2-3 mm nonobstructing stone at the lower pole of the left kidney. Electronically Signed  By: Marin Roberts M.D.   On: 12/04/2020 11:47   MR LUMBAR SPINE WO CONTRAST  Result Date: 12/04/2020 CLINICAL DATA:  Low back pain.  Abnormal CT lumbar spine EXAM: MRI LUMBAR SPINE WITHOUT CONTRAST TECHNIQUE: Multiplanar, multisequence MR imaging of the lumbar spine was performed. No intravenous contrast was administered. COMPARISON:  CT lumbar spine 12/04/2020 FINDINGS: Segmentation:  Normal Alignment:  Normal Vertebrae: There is edema in the L2 vertebral body on the left extending to the endplate. There is a smaller area of edema in the superior endplate of L3 on the left. The endplates appear eroded on the left at L2-3. There is extensive edema in the left psoas muscle which is enlarged and hyperintense on T2. There are small  loculated fluid collections in the left psoas muscle suggestive of abscess. No epidural abscess. Postcontrast imaging recommended for further evaluation. No other areas of bone marrow edema. Conus medullaris and cauda equina: Conus extends to the L1-2 level. Conus and cauda equina appear normal. Paraspinal and other soft tissues: Left psoas muscle is asymmetrically enlarged and edematous with loculated small fluid collections in the left psoas muscle. Right psoas muscle is normal. Disc levels: L1-2: Negative L2-3: Mild disc space narrowing. Endplate erosion on the left with edema extending into the L2 and L3 vertebral bodies. Right side of the disc space appears normal. No significant spinal stenosis. L3-4: Negative L4-5: Mild disc and mild facet degeneration.  Negative for stenosis L5-S1: Mild disc bulging and mild facet degeneration. Mild subarticular and foraminal stenosis bilaterally. IMPRESSION: Endplate erosions at L2-3 on the left. There is extensive bone marrow edema on the left at L2 and a smaller area of edema in the superior bone marrow at L3 on the left. This extends into the left psoas muscle which is enlarged, edematous, with multiple loculated fluid collections. Findings most likely due to discitis rather than tumor. Postcontrast imaging is recommended to further evaluate the abscesses and evaluate for epidural abscess which is not definitely seen on the unenhanced study. These results were called by telephone at the time of interpretation on 12/04/2020 at 4:52 pm to provider PA, who verbally acknowledged these results. Electronically Signed   By: Marlan Palau M.D.   On: 12/04/2020 16:53   MR LUMBAR SPINE W CONTRAST  Result Date: 12/04/2020 CLINICAL DATA:  Probable L2-3 discitis osteomyelitis. Follow-up postcontrast imaging. EXAM: MRI LUMBAR SPINE WITH CONTRAST TECHNIQUE: Axial and sagittal T1 pre and postcontrast sequences of the lumbar spine were performed. CONTRAST:  9mL GADAVIST GADOBUTROL 1  MMOL/ML IV SOLN COMPARISON:  Same day noncontrast lumbar spine MRI and CT FINDINGS: Segmentation:  Standard. Alignment:  Physiologic. Vertebrae: Findings of discitis-osteomyelitis of the L2-3 level with endplate erosions. Prominent marrow enhancement throughout the L2 vertebral body extending into the left pedicle. Mild marrow enhancement at the superior aspect of the L3 vertebral body. Confluent low T1 signal within the vertebral bodies at these locations. The remaining vertebral bodies are within normal limits. No evidence of fracture. No additional sites of discitis-osteomyelitis are evident. Conus medullaris and cauda equina: Conus extends to the L1-2 level. No epidural phlegmon or abscess. Paraspinal and other soft tissues: Asymmetric enlargement of the left psoas muscle centered at the L2-3 level with diffuse intramuscular enhancement. Fluid signal within the left psoas muscle without definite peripherally enhancing collection suggests myositis with phlegmon. Disc levels: Please see previous MRI report for level by level detail of the disc levels. IMPRESSION: 1. Postcontrast images of the lumbar spine are consistent with discitis-osteomyelitis  at the L2-3 level. No epidural phlegmon or abscess. 2. Asymmetric enlargement of the left psoas muscle centered at the L2-3 level with diffuse intramuscular enhancement. Fluid signal within the left psoas muscle without definite peripherally enhancing collection suggests myositis with phlegmon. Electronically Signed   By: Duanne GuessNicholas  Plundo D.O.   On: 12/04/2020 19:42   CT L-SPINE NO CHARGE  Result Date: 12/04/2020 CLINICAL DATA:  Low back pain several weeks. EXAM: CT LUMBAR SPINE WITHOUT CONTRAST TECHNIQUE: Multidetector CT imaging of the lumbar spine was performed without intravenous contrast administration. Multiplanar CT image reconstructions were also generated. COMPARISON:  CT abdomen pelvis 12/04/2020 FINDINGS: Segmentation: Normal Alignment: Normal Vertebrae:  There is bony destruction of the inferior L2 vertebral body on the left extending to the disc space. There is also mild erosion of the superior endplate of L3 on the left. Small amount of calcification extends lateral from the disc space into the left psoas muscle. No fracture is identified. No other skeletal lesion identified. Paraspinal and other soft tissues: No paraspinous fluid collection identified. Calcification lateral to the L2-3 disc space on the left extends into the psoas muscle which is likely due to bony destruction. Disc levels: L1-2: Negative L2-3: Mild disc space narrowing. Erosive endplate changes are present on the left at L2-3 primarily at L2 with bony destruction extending well into the left L2 vertebral body on the left. Calcification extending into the left psoas muscle. Mild left foraminal narrowing. L3-4: Mild disc bulging and mild facet degeneration. No significant stenosis. L4-5: Mild disc bulging and mild to moderate facet degeneration bilaterally. 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 There is mild erosion of the superior endplate of L3. There appears to be bone extending into the left psoas muscle which is likely due to destructive mass lesion. Favor metastatic disease although infection is a consideration. Recommend MRI lumbar spine without with contrast. No significant spinal stenosis. Electronically Signed   By: Marlan Palauharles  Clark M.D.   On: 12/04/2020 11:33        Scheduled Meds: . insulin aspart  0-15 Units Subcutaneous TID WC  . insulin aspart  0-5 Units Subcutaneous QHS  . multivitamin with minerals  1 tablet Oral Daily  . nicotine  14 mg Transdermal Daily   Continuous Infusions:   LOS: 1 day    Time spent: 45min    Azucena FallenWilliam C Kaydan Wilhoite, DO [facility]  If 7PM-7AM, please contact night-coverage [email or web address]  12/05/2020, 8:14 AM

## 2020-12-05 NOTE — Progress Notes (Signed)
Pharmacy Antibiotic Note  Jeffrey Webb is a 44 y.o. male  with L2-L3  Discitis/osteomyelitis   Pharmacy has been consulted for ancef dosing. ID is following  -Blood cultures show GPC in 2/3 bottles and BCID with staph aureus (MEC negative).  -CrCl ~ 90  Plan: -Ancef 2gm IV q8h -Will follow renal function, cultures and clinical progress   Height: 5\' 6"  (167.6 cm) Weight: 88.5 kg (195 lb) IBW/kg (Calculated) : 63.8  Temp (24hrs), Avg:98.3 F (36.8 C), Min:98.2 F (36.8 C), Max:98.4 F (36.9 C)  Recent Labs  Lab 12/04/20 0915 12/05/20 0353  WBC 10.9* 8.5  CREATININE 0.98 0.91    Estimated Creatinine Clearance: 108 mL/min (by C-G formula based on SCr of 0.91 mg/dL).    No Known Allergies  Antimicrobials this admission: 5/22 ancef  Dose adjustments this admission:   Microbiology results: 5/22 blood x2- GPC in 2/3 bottles and BCID with staph aureus (MEC negative).    Thank you for allowing pharmacy to be a part of this patient's care.  6/22, PharmD Clinical Pharmacist **Pharmacist phone directory can now be found on [email or web address] ([email or web address]).  Listed under [facility].

## 2020-12-06 ENCOUNTER — Inpatient Hospital Stay (HOSPITAL_COMMUNITY): Payer: Self-pay

## 2020-12-06 DIAGNOSIS — R7881 Bacteremia: Secondary | ICD-10-CM

## 2020-12-06 LAB — GLUCOSE, CAPILLARY
Glucose-Capillary: 278 mg/dL — ABNORMAL HIGH (ref 70–99)
Glucose-Capillary: 279 mg/dL — ABNORMAL HIGH (ref 70–99)
Glucose-Capillary: 206 mg/dL — ABNORMAL HIGH (ref 70–99)
Glucose-Capillary: 335 mg/dL — ABNORMAL HIGH (ref 70–99)
Glucose-Capillary: 186 mg/dL — ABNORMAL HIGH (ref 70–99)

## 2020-12-06 LAB — ECHOCARDIOGRAM COMPLETE
Area-P 1/2: 4.49 cm2
Height: 66 in
S' Lateral: 2.5 cm
Weight: 3120 oz

## 2020-12-06 LAB — CBC
HCT: 39.9 % (ref 39.0–52.0)
Hemoglobin: 13.1 g/dL (ref 13.0–17.0)
MCH: 26.4 pg (ref 26.0–34.0)
MCHC: 32.8 g/dL (ref 30.0–36.0)
MCV: 80.4 fL (ref 80.0–100.0)
Platelets: 418 10*3/uL — ABNORMAL HIGH (ref 150–400)
RBC: 4.96 MIL/uL (ref 4.22–5.81)
RDW: 12.3 % (ref 11.5–15.5)
WBC: 8.9 10*3/uL (ref 4.0–10.5)
nRBC: 0 % (ref 0.0–0.2)

## 2020-12-06 LAB — BASIC METABOLIC PANEL
Anion gap: 11 (ref 5–15)
BUN: 10 mg/dL (ref 6–20)
CO2: 26 mmol/L (ref 22–32)
Calcium: 9.3 mg/dL (ref 8.9–10.3)
Chloride: 96 mmol/L — ABNORMAL LOW (ref 98–111)
Creatinine, Ser: 0.97 mg/dL (ref 0.61–1.24)
GFR, Estimated: >60 mL/min (ref >60)
Glucose, Bld: 294 mg/dL — ABNORMAL HIGH (ref 70–99)
Potassium: 4 mmol/L (ref 3.5–5.1)
Sodium: 133 mmol/L — ABNORMAL LOW (ref 135–145)

## 2020-12-06 MED ORDER — MORPHINE SULFATE (PF) 2 MG/ML IV SOLN
2.0000 mg | INTRAVENOUS | Status: DC | PRN
  Administered 2020-12-07 – 2020-12-08 (×5): 2 mg via INTRAVENOUS
  Filled 2020-12-06 (×6): qty 1

## 2020-12-06 MED ORDER — INSULIN STARTER KIT- PEN NEEDLES (ENGLISH)
1.0000 | Freq: Once | Status: AC
  Administered 2020-12-06: 1
  Filled 2020-12-06: qty 1

## 2020-12-06 MED ORDER — LIVING WELL WITH DIABETES BOOK
Freq: Once | Status: AC
  Filled 2020-12-06: qty 1

## 2020-12-06 NOTE — Progress Notes (Signed)
PROGRESS NOTE    Jeffrey Webb  ZOX:[id number] DOB: 04-04-1976 DOA: 12/04/2020 PCP: Patient, No Pcp Per (Inactive)   Brief Narrative:  Jeffrey Webb is a 45 y.o. male, with PMH of type 2 diabetes, substance abuse, tobacco abuse, who presented to the ER on 12/04/2020 with back pain for over 3 weeks. Patient states he has had ongoing back pain for the past 3 weeks.  The pain came on all of a sudden, denies any trauma, injury or fall.  Pain is worse with movement, but he has been ambulating okay. [facility]: Imaging obtained on admission: MRI and CT of abdomen pelvis and spine demonstrating findings concerning with L2 discitis and/or osteomyelitis and questionable psoas abscess. Neurosurgery was consulted - recommending IR guided biopsy and hold abx unless blood cultures return positive preliminarily.   Assessment & Plan:   Active Problems:   Discitis of lumbar region   Psoas abscess, left (HCC)   Type 2 diabetes mellitus with complication, without long-term current use of insulin (HCC)   Substance abuse (HCC)   Pseudohyponatremia   Acute discitis and/or osteomyelitis  Acute bacteremia with MSSA organism, POA Likely concurrent left psoas/deep tissue abscess - In the setting of drug abuse - monitor narcotic use here closely - Imaging above demonstrated L2-L3 discitis/osteomyelitis - Neurosurgery contacted by the ER, recommend IR biopsy versus abscess drainage -IR indicates there is no fluid collection to drain - Cefazolin per ID based on blood cultures (presume spinal infection is the same) - Pain moderately controlled with morphine and oxycodone IR - transition off IV narcotics given history as quickly as tolerated  Uncontrolled type 2 diabetes with hyperglycemia - Glucose on admission 348 - A1c 14.3 - Continue sliding scale insulin, hypoglycemic protocol  Illicit substance abuse - Urine drug screen positive for cocaine - Recommend cessation  Tobacco abuse -Lengthy discussion on  cessation - Nicotine patch prescribed  DVT prophylaxis: SCDs, early ambulation Code Status: Full Family Communication: At bedside  Status is: Inpatient  Dispo: The patient is from: Home              Anticipated d/c is to: To be determined              Anticipated d/c date is: 72+ hours pending clinical work-up              Patient currently not medically stable for discharge  Consultants:   Neurosurgery, interventional radiology  Procedures:   None indicated  Antimicrobials:  Cefazolin 5/22--> ongoing  Subjective: No acute issues or events overnight, pain moderately well controlled today  Objective: Vitals:   12/05/20 1829 12/05/20 2007 12/06/20 0001 12/06/20 0453  BP: 108/67 138/89 133/84 125/85  Pulse: (!) 58 98 99 100  Resp: (!) 22 20 20 20   Temp: 98.4 F (36.9 C) 98.8 F (37.1 C) 99.7 F (37.6 C) 99.4 F (37.4 C)  TempSrc: Oral Oral Oral Oral  SpO2: 99% 98% 98% 97%  Weight:      Height:        Intake/Output Summary (Last 24 hours) at 12/06/2020 0800 Last data filed at 12/06/2020 0456 Gross per 24 hour  Intake 200 ml  Output 1400 ml  Net -1200 ml   Filed Weights   12/04/20 0800  Weight: 88.5 kg    Examination:  General: Resting comfortably in bed no acute distress, somewhat somnolent and somewhat difficult to arouse, despite this he continues to request IV narcotics HEENT:  Normocephalic atraumatic.  Sclerae nonicteric, noninjected.  Extraocular movements  intact bilaterally. Neck:  Without mass or deformity.  Trachea is midline. Lungs:  Clear to auscultate bilaterally without rhonchi, wheeze, or rales. Heart:  Regular rate and rhythm.  Without murmurs, rubs, or gallops. Abdomen:  Soft, nontender, nondistended.  Without guarding or rebound. Extremities: Without cyanosis, clubbing, edema, or obvious deformity. Vascular:  Dorsalis pedis and posterior tibial pulses palpable bilaterally. Skin:  Warm and dry, no erythema, no ulcerations.   Data  Reviewed: I have personally reviewed following labs and imaging studies  CBC: Recent Labs  Lab 12/04/20 0915 12/05/20 0353 12/06/20 0553  WBC 10.9* 8.5 8.9  NEUTROABS 7.9*  --   --   HGB 13.2 12.7* 13.1  HCT 40.6 39.7 39.9  MCV 80.9 82.5 80.4  PLT 462* 437* 418*   Basic Metabolic Panel: Recent Labs  Lab 12/04/20 0915 12/05/20 0353 12/06/20 0553  NA 133* 135 133*  K 4.2 4.0 4.0  CL 101 101 96*  CO2 23 26 26   GLUCOSE 348* 285* 294*  BUN 13 15 10   CREATININE 0.98 0.91 0.97  CALCIUM 9.4 8.8* 9.3  MG  --  2.0  --   PHOS  --  4.2  --    GFR: Estimated Creatinine Clearance: 101.3 mL/min (by C-G formula based on SCr of 0.97 mg/dL). Liver Function Tests: Recent Labs  Lab 12/04/20 0915  AST 14*  ALT 20  ALKPHOS 104  BILITOT 0.3  PROT 8.3*  ALBUMIN 3.0*   Recent Labs  Lab 12/04/20 0915  LIPASE 24   No results for input(s): AMMONIA in the last 168 hours. Coagulation Profile: No results for input(s): INR, PROTIME in the last 168 hours. Cardiac Enzymes: No results for input(s): CKTOTAL, CKMB, CKMBINDEX, TROPONINI in the last 168 hours. BNP (last 3 results) No results for input(s): PROBNP in the last 8760 hours. HbA1C: Recent Labs    12/05/20 0353  HGBA1C 14.3*   CBG: Recent Labs  Lab 12/05/20 0136 12/05/20 0842 12/05/20 1237 12/05/20 2127 12/06/20 0608  GLUCAP 377* 301* 249* 294* 279*   Lipid Profile: No results for input(s): CHOL, HDL, LDLCALC, TRIG, CHOLHDL, LDLDIRECT in the last 72 hours. Thyroid Function Tests: No results for input(s): TSH, T4TOTAL, FREET4, T3FREE, THYROIDAB in the last 72 hours. Anemia Panel: No results for input(s): VITAMINB12, FOLATE, FERRITIN, TIBC, IRON, RETICCTPCT in the last 72 hours. Sepsis Labs: No results for input(s): PROCALCITON, LATICACIDVEN in the last 168 hours.  Recent Results (from the past 240 hour(s))  Urine C&S     Status: None   Collection Time: 12/04/20  5:06 AM   Specimen: Urine, Clean Catch  Result  Value Ref Range Status   Specimen Description URINE, CLEAN CATCH  Final   Special Requests NONE  Final   Culture   Final    NO GROWTH Performed at [facility], [address] [place], [address] [place]    Report Status 12/05/2020 FINAL  Final  Resp Panel by RT-PCR (Flu A&B, Covid) Nasopharyngeal Swab     Status: None   Collection Time: 12/04/20  6:42 PM   Specimen: Nasopharyngeal Swab; Nasopharyngeal(NP) swabs in vial transport medium  Result Value Ref Range Status   SARS Coronavirus 2 by RT PCR NEGATIVE NEGATIVE Final    Comment: (NOTE) SARS-CoV-2 target nucleic acids are NOT DETECTED.  The SARS-CoV-2 RNA is generally detectable in upper respiratory specimens during the acute phase of infection. The lowest concentration of SARS-CoV-2 viral copies this assay can detect is 138 copies/mL. A negative result  does not preclude SARS-Cov-2 infection and should not be used as the sole basis for treatment or other patient management decisions. A negative result may occur with  improper specimen collection/handling, submission of specimen other than nasopharyngeal swab, presence of viral mutation(s) within the areas targeted by this assay, and inadequate number of viral copies(<138 copies/mL). A negative result must be combined with clinical observations, patient history, and epidemiological information. The expected result is Negative.  Fact Sheet for Patients:  BloggerCourse.com  Fact Sheet for Healthcare Providers:  SeriousBroker.it  This test is no t yet approved or cleared by the [place] FDA and  has been authorized for detection and/or diagnosis of SARS-CoV-2 by FDA under an Emergency Use Authorization (EUA). This EUA will remain  in effect (meaning this test can be used) for the duration of the COVID-19 declaration under Section 564(b)(1) of the Act, 21 U.S.C.section 360bbb-3(b)(1), unless the authorization is  terminated  or revoked sooner.       Influenza A by PCR NEGATIVE NEGATIVE Final   Influenza B by PCR NEGATIVE NEGATIVE Final    Comment: (NOTE) The Xpert Xpress SARS-CoV-2/FLU/RSV plus assay is intended as an aid in the diagnosis of influenza from Nasopharyngeal swab specimens and should not be used as a sole basis for treatment. Nasal washings and aspirates are unacceptable for Xpert Xpress SARS-CoV-2/FLU/RSV testing.  Fact Sheet for Patients: BloggerCourse.com  Fact Sheet for Healthcare Providers: SeriousBroker.it  This test is not yet approved or cleared by the [place] FDA and has been authorized for detection and/or diagnosis of SARS-CoV-2 by FDA under an Emergency Use Authorization (EUA). This EUA will remain in effect (meaning this test can be used) for the duration of the COVID-19 declaration under Section 564(b)(1) of the Act, 21 U.S.C. section 360bbb-3(b)(1), unless the authorization is terminated or revoked.  Performed at [facility], [address], [place], [place] [address]   Blood culture (routine x 2)     Status: None (Preliminary result)   Collection Time: 12/04/20  8:46 PM   Specimen: BLOOD LEFT HAND  Result Value Ref Range Status   Specimen Description BLOOD LEFT HAND  Final   Special Requests   Final    BOTTLES DRAWN AEROBIC AND ANAEROBIC Blood Culture adequate volume   Culture  Setup Time   Final    GRAM POSITIVE COCCI AEROBIC BOTTLE ONLY CRITICAL VALUE NOTED.  VALUE IS CONSISTENT WITH PREVIOUSLY REPORTED AND CALLED VALUE. Performed at [facility], [address], [place], [place] [address]    Culture GRAM POSITIVE COCCI  Final   Report Status PENDING  Incomplete  Blood culture (routine x 2)     Status: None (Preliminary result)   Collection Time: 12/04/20  8:59 PM   Specimen: BLOOD  Result Value Ref Range Status   Specimen Description BLOOD LEFT ANTECUBITAL  Final   Special  Requests   Final    BOTTLES DRAWN AEROBIC AND ANAEROBIC Blood Culture adequate volume   Culture  Setup Time   Final    GRAM POSITIVE COCCI IN BOTH AEROBIC AND ANAEROBIC BOTTLES CRITICAL RESULT CALLED TO, READ BACK BY AND VERIFIED WITH: [name] PHARMD  12/05/20 EB Performed at [facility], [address], [place], [place] [address]    Culture [facility] POSITIVE COCCI  Final   Report Status PENDING  Incomplete  Blood Culture ID Panel (Reflexed)     Status: Abnormal   Collection Time: 12/04/20  8:59 PM  Result Value  Ref Range Status   Enterococcus faecalis NOT DETECTED NOT DETECTED Final   Enterococcus Faecium NOT DETECTED NOT DETECTED Final   Listeria monocytogenes NOT DETECTED NOT DETECTED Final   Staphylococcus species DETECTED (A) NOT DETECTED Final    Comment: CRITICAL RESULT CALLED TO, READ BACK BY AND VERIFIED WITH: ANDREW MEYER PHARMD  12/05/20 EB    Staphylococcus aureus (BCID) DETECTED (A) NOT DETECTED Final    Comment: CRITICAL RESULT CALLED TO, READ BACK BY AND VERIFIED WITH: ANDREW MEYER PHARMD  12/05/20 EB    Staphylococcus epidermidis NOT DETECTED NOT DETECTED Final   Staphylococcus lugdunensis NOT DETECTED NOT DETECTED Final   Streptococcus species NOT DETECTED NOT DETECTED Final   Streptococcus agalactiae NOT DETECTED NOT DETECTED Final   Streptococcus pneumoniae NOT DETECTED NOT DETECTED Final   Streptococcus pyogenes NOT DETECTED NOT DETECTED Final   A.calcoaceticus-baumannii NOT DETECTED NOT DETECTED Final   Bacteroides fragilis NOT DETECTED NOT DETECTED Final   Enterobacterales NOT DETECTED NOT DETECTED Final   Enterobacter cloacae complex NOT DETECTED NOT DETECTED Final   Escherichia coli NOT DETECTED NOT DETECTED Final   Klebsiella aerogenes NOT DETECTED NOT DETECTED Final   Klebsiella oxytoca NOT DETECTED NOT DETECTED Final   Klebsiella pneumoniae NOT DETECTED NOT DETECTED Final   Proteus species NOT DETECTED NOT DETECTED Final    Salmonella species NOT DETECTED NOT DETECTED Final   Serratia marcescens NOT DETECTED NOT DETECTED Final   Haemophilus influenzae NOT DETECTED NOT DETECTED Final   Neisseria meningitidis NOT DETECTED NOT DETECTED Final   Pseudomonas aeruginosa NOT DETECTED NOT DETECTED Final   Stenotrophomonas maltophilia NOT DETECTED NOT DETECTED Final   Candida albicans NOT DETECTED NOT DETECTED Final   Candida auris NOT DETECTED NOT DETECTED Final   Candida glabrata NOT DETECTED NOT DETECTED Final   Candida krusei NOT DETECTED NOT DETECTED Final   Candida parapsilosis NOT DETECTED NOT DETECTED Final   Candida tropicalis NOT DETECTED NOT DETECTED Final   Cryptococcus neoformans/gattii NOT DETECTED NOT DETECTED Final   Meth resistant mecA/C and MREJ NOT DETECTED NOT DETECTED Final    Comment: Performed at [facility], [address], [place], [place] [address]         Radiology Studies: CT ABDOMEN PELVIS WO CONTRAST  Result Date: 12/04/2020 CLINICAL DATA:  Abdominal pain, acute, nonlocalized. EXAM: CT ABDOMEN AND PELVIS WITHOUT CONTRAST TECHNIQUE: Multidetector CT imaging of the abdomen and pelvis was performed following the standard protocol without IV contrast. COMPARISON:  None. FINDINGS: Lower chest: Minimal atelectasis is present at the bases. No nodule or mass lesion is present. No significant airspace consolidation is present. Heart size is normal. No significant pleural or pericardial effusion is present. Hepatobiliary: No focal liver abnormality is seen. No gallstones, gallbladder wall thickening, or biliary dilatation. Pancreas: Unremarkable. No pancreatic ductal dilatation or surrounding inflammatory changes. Spleen: Normal in size without focal abnormality. Adrenals/Urinary Tract: Adrenal glands are normal bilaterally. A 2-3 mm nonobstructing stone is present at the lower pole of the left kidney. No other significant nephrolithiasis is present. Chest mass lesion or obstruction is present.  Ureters are within normal limits. The urinary bladder is normal. Stomach/Bowel: Stomach and duodenum are within normal limits. Small bowel is unremarkable. Terminal ileum is within normal limits. Appendix is visualized and within normal limits. Extends into the anatomic pelvis. The ascending and transverse colon are normal. The descending and sigmoid colon are unremarkable. Vascular/Lymphatic: No significant vascular findings are present. No enlarged abdominal or pelvic lymph nodes. Reproductive: Prostate is  unremarkable. Other: No abdominal wall hernia or abnormality. No abdominopelvic ascites. Musculoskeletal: Vertebral body heights and alignment are normal. Lucency noted at the inferior left aspect of the L2 vertebral body. Asymmetric ossification is present along the left side of the disc margin. No other focal lucencies are evident. IMPRESSION: 1. Focal lucency to along the left inferior aspect of the L2 vertebral body with extension laterally raises concern for metastatic disease. Recommend MRI of the lumbar spine without and with contrast. No other focal osseous lesions are present. No primary lesion is evident. 2. No other acute or focal lesion to explain the patient's abdominal pain. 3. 2-3 mm nonobstructing stone at the lower pole of the left kidney. Electronically Signed   By: Marin Roberts M.D.   On: 12/04/2020 11:47   MR LUMBAR SPINE WO CONTRAST  Result Date: 12/04/2020 CLINICAL DATA:  Low back pain.  Abnormal CT lumbar spine EXAM: MRI LUMBAR SPINE WITHOUT CONTRAST TECHNIQUE: Multiplanar, multisequence MR imaging of the lumbar spine was performed. No intravenous contrast was administered. COMPARISON:  CT lumbar spine 12/04/2020 FINDINGS: Segmentation:  Normal Alignment:  Normal Vertebrae: There is edema in the L2 vertebral body on the left extending to the endplate. There is a smaller area of edema in the superior endplate of L3 on the left. The endplates appear eroded on the left at L2-3.  There is extensive edema in the left psoas muscle which is enlarged and hyperintense on T2. There are small loculated fluid collections in the left psoas muscle suggestive of abscess. No epidural abscess. Postcontrast imaging recommended for further evaluation. No other areas of bone marrow edema. Conus medullaris and cauda equina: Conus extends to the L1-2 level. Conus and cauda equina appear normal. Paraspinal and other soft tissues: Left psoas muscle is asymmetrically enlarged and edematous with loculated small fluid collections in the left psoas muscle. Right psoas muscle is normal. Disc levels: L1-2: Negative L2-3: Mild disc space narrowing. Endplate erosion on the left with edema extending into the L2 and L3 vertebral bodies. Right side of the disc space appears normal. No significant spinal stenosis. L3-4: Negative L4-5: Mild disc and mild facet degeneration.  Negative for stenosis L5-S1: Mild disc bulging and mild facet degeneration. Mild subarticular and foraminal stenosis bilaterally. IMPRESSION: Endplate erosions at L2-3 on the left. There is extensive bone marrow edema on the left at L2 and a smaller area of edema in the superior bone marrow at L3 on the left. This extends into the left psoas muscle which is enlarged, edematous, with multiple loculated fluid collections. Findings most likely due to discitis rather than tumor. Postcontrast imaging is recommended to further evaluate the abscesses and evaluate for epidural abscess which is not definitely seen on the unenhanced study. These results were called by telephone at the time of interpretation on 12/04/2020 at 4:52 pm to provider PA, who verbally acknowledged these results. Electronically Signed   By: Marlan Palau M.D.   On: 12/04/2020 16:53   MR LUMBAR SPINE W CONTRAST  Result Date: 12/04/2020 CLINICAL DATA:  Probable L2-3 discitis osteomyelitis. Follow-up postcontrast imaging. EXAM: MRI LUMBAR SPINE WITH CONTRAST TECHNIQUE: Axial and sagittal  T1 pre and postcontrast sequences of the lumbar spine were performed. CONTRAST:  72mL GADAVIST GADOBUTROL 1 MMOL/ML IV SOLN COMPARISON:  Same day noncontrast lumbar spine MRI and CT FINDINGS: Segmentation:  Standard. Alignment:  Physiologic. Vertebrae: Findings of discitis-osteomyelitis of the L2-3 level with endplate erosions. Prominent marrow enhancement throughout the L2 vertebral body extending into the left  pedicle. Mild marrow enhancement at the superior aspect of the L3 vertebral body. Confluent low T1 signal within the vertebral bodies at these locations. The remaining vertebral bodies are within normal limits. No evidence of fracture. No additional sites of discitis-osteomyelitis are evident. Conus medullaris and cauda equina: Conus extends to the L1-2 level. No epidural phlegmon or abscess. Paraspinal and other soft tissues: Asymmetric enlargement of the left psoas muscle centered at the L2-3 level with diffuse intramuscular enhancement. Fluid signal within the left psoas muscle without definite peripherally enhancing collection suggests myositis with phlegmon. Disc levels: Please see previous MRI report for level by level detail of the disc levels. IMPRESSION: 1. Postcontrast images of the lumbar spine are consistent with discitis-osteomyelitis at the L2-3 level. No epidural phlegmon or abscess. 2. Asymmetric enlargement of the left psoas muscle centered at the L2-3 level with diffuse intramuscular enhancement. Fluid signal within the left psoas muscle without definite peripherally enhancing collection suggests myositis with phlegmon. Electronically Signed   By: Duanne Guess D.O.   On: 12/04/2020 19:42   CT L-SPINE NO CHARGE  Result Date: 12/04/2020 CLINICAL DATA:  Low back pain several weeks. EXAM: CT LUMBAR SPINE WITHOUT CONTRAST TECHNIQUE: Multidetector CT imaging of the lumbar spine was performed without intravenous contrast administration. Multiplanar CT image reconstructions were also  generated. COMPARISON:  CT abdomen pelvis 12/04/2020 FINDINGS: Segmentation: Normal Alignment: Normal Vertebrae: There is bony destruction of the inferior L2 vertebral body on the left extending to the disc space. There is also mild erosion of the superior endplate of L3 on the left. Small amount of calcification extends lateral from the disc space into the left psoas muscle. No fracture is identified. No other skeletal lesion identified. Paraspinal and other soft tissues: No paraspinous fluid collection identified. Calcification lateral to the L2-3 disc space on the left extends into the psoas muscle which is likely due to bony destruction. Disc levels: L1-2: Negative L2-3: Mild disc space narrowing. Erosive endplate changes are present on the left at L2-3 primarily at L2 with bony destruction extending well into the left L2 vertebral body on the left. Calcification extending into the left psoas muscle. Mild left foraminal narrowing. L3-4: Mild disc bulging and mild facet degeneration. No significant stenosis. L4-5: Mild disc bulging and mild to moderate facet degeneration bilaterally. 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 There is mild erosion of the superior endplate of L3. There appears to be bone extending into the left psoas muscle which is likely due to destructive mass lesion. Favor metastatic disease although infection is a consideration. Recommend MRI lumbar spine without with contrast. No significant spinal stenosis. Electronically Signed   By: Marlan Palau M.D.   On: 12/04/2020 11:33   Scheduled Meds: . insulin aspart  0-15 Units Subcutaneous TID WC  . insulin aspart  0-5 Units Subcutaneous QHS  . multivitamin with minerals  1 tablet Oral Daily  . nicotine  14 mg Transdermal Daily   Continuous Infusions: .  ceFAZolin (ANCEF) IV 2 g (12/06/20 0655)     LOS: 2 days    Time spent:   Azucena Fallen, DO [facility]  If 7PM-7AM, please contact night-coverage [email or web address]  12/06/2020, 8:00 AM

## 2020-12-06 NOTE — Progress Notes (Signed)
  Echocardiogram 2D Echocardiogram has been performed.  Dorena Dew Ronan Duecker 12/06/2020, 10:31 AM

## 2020-12-06 NOTE — Progress Notes (Signed)
Inpatient Diabetes Program Recommendations  AACE/ADA: New Consensus Statement on Inpatient Glycemic Control   Target Ranges:  Prepandial:   less than 140 mg/dL      Peak postprandial:   less than 180 mg/dL (1-2 hours)      Critically ill patients:  140 - 180 mg/dL  Results for Jeffrey Webb, Jeffrey Webb (MRN [id number]) as of 12/06/2020 12:17  Ref. Range 12/05/2020 08:42 12/05/2020 12:37 12/05/2020 18:02 12/05/2020 21:27 12/06/2020 06:08 12/06/2020 11:05  Glucose-Capillary Latest Ref Range: 70 - 99 mg/dL 301 (H) 249 (H) 278 (H) 294 (H) 279 (H) 206 (H)   Results for Jeffrey Webb, Jeffrey Webb (MRN [id number]) as of 12/06/2020 12:17  Ref. Range 11/21/2020 01:43 11/26/2020 12:24 12/04/2020 09:15 12/05/2020 03:53  Glucose Latest Ref Range: 70 - 99 mg/dL 624 (HH) 512 (HH) 348 (H) 285 (H)  Hemoglobin A1C Latest Ref Range: 4.8 - 5.6 %    14.3 (H)   Review of Glycemic Control  Diabetes history: DM2 Outpatient Diabetes medications: Metformin 500 mg BID (not taking) Current orders for Inpatient glycemic control: Novolog 0-15 units TID with meals, Novolog 0-5 units QHS  Inpatient Diabetes Program Recommendations:    Insulin: Please consider ordering 70/30 12 units BID (dose will provide a total of 16.8 units for basal and 7.2 units for meal coverage per day).  HbgA1C:  A1C 14.3% on 12/05/20 indicating an average glucose of 364 mg/dl over the past 2-3 months.  NOTE: Diabetes Coordinator working from [facility] today. Attempted to call patient's room but no answer. Patient admitted with acute discitis and initial lab glucose of 348 mg/dl on 12/04/20. Noted patient presented to Emergency Room on 11/21/20 with lab glucose of 624 mg/dl. Current A1C 14.3% indicating an average glucose of 364 mg/dl. Anticipate patient will need to be discharged on insulin. Noted patient does not have any insurance listed or a PCP. Ordered TOC consult to assist with follow up and medication needs, RD consult for diet education, Living Well with DM book, an  insulin starter kit, and patient education by bedside RNs. Nursing, please educate patient on insulin administration and allow patient to self administer insulin while inpatient. Will ask diabetes coordinator on [facility] to follow up with patient.  Thanks, Marie Byrd, RN, MSN, CDE Diabetes Coordinator Inpatient Diabetes Program [phone] (Team Pager from 8am to 5pm)    

## 2020-12-06 NOTE — Progress Notes (Signed)
Inpatient Diabetes Program Recommendations  AACE/ADA: New Consensus Statement on Inpatient Glycemic Control (2015)  Target Ranges:  Prepandial:   less than 140 mg/dL      Peak postprandial:   less than 180 mg/dL (1-2 hours)      Critically ill patients:  140 - 180 mg/dL   Lab Results  Component Value Date   GLUCAP 206 (H) 12/06/2020   HGBA1C 14.3 (H) 12/05/2020    Went to see patient to start insulin teaching.  He is in a dark room with complaints of back pain.  Briefly discussed need for insulin at home.  Spoke with RN; not discharging today.  Will see patient tomorrow.

## 2020-12-07 LAB — BASIC METABOLIC PANEL
Anion gap: 8 (ref 5–15)
BUN: 10 mg/dL (ref 6–20)
CO2: 26 mmol/L (ref 22–32)
Calcium: 9.5 mg/dL (ref 8.9–10.3)
Chloride: 99 mmol/L (ref 98–111)
Creatinine, Ser: 0.91 mg/dL (ref 0.61–1.24)
GFR, Estimated: >60 mL/min (ref >60)
Glucose, Bld: 285 mg/dL — ABNORMAL HIGH (ref 70–99)
Potassium: 3.9 mmol/L (ref 3.5–5.1)
Sodium: 133 mmol/L — ABNORMAL LOW (ref 135–145)

## 2020-12-07 LAB — GLUCOSE, CAPILLARY
Glucose-Capillary: 291 mg/dL — ABNORMAL HIGH (ref 70–99)
Glucose-Capillary: 209 mg/dL — ABNORMAL HIGH (ref 70–99)
Glucose-Capillary: 218 mg/dL — ABNORMAL HIGH (ref 70–99)
Glucose-Capillary: 242 mg/dL — ABNORMAL HIGH (ref 70–99)

## 2020-12-07 LAB — CBC
HCT: 40.5 % (ref 39.0–52.0)
Hemoglobin: 13.3 g/dL (ref 13.0–17.0)
MCH: 26.5 pg (ref 26.0–34.0)
MCHC: 32.8 g/dL (ref 30.0–36.0)
MCV: 80.8 fL (ref 80.0–100.0)
Platelets: 364 10*3/uL (ref 150–400)
RBC: 5.01 MIL/uL (ref 4.22–5.81)
RDW: 12.3 % (ref 11.5–15.5)
WBC: 7.9 10*3/uL (ref 4.0–10.5)
nRBC: 0 % (ref 0.0–0.2)

## 2020-12-07 MED ORDER — POLYETHYLENE GLYCOL 3350 17 G PO PACK
17.0000 g | PACK | Freq: Two times a day (BID) | ORAL | Status: DC
  Administered 2020-12-07 – 2020-12-09 (×5): 17 g via ORAL
  Filled 2020-12-07 (×14): qty 1

## 2020-12-07 MED ORDER — SENNOSIDES-DOCUSATE SODIUM 8.6-50 MG PO TABS: 1.0000 | ORAL_TABLET | Freq: Two times a day (BID) | ORAL | Status: DC | PRN

## 2020-12-07 NOTE — Progress Notes (Signed)
Inpatient Diabetes Program Recommendations  AACE/ADA: New Consensus Statement on Inpatient Glycemic Control (2015)  Target Ranges:  Prepandial:   less than 140 mg/dL      Peak postprandial:   less than 180 mg/dL (1-2 hours)      Critically ill patients:  140 - 180 mg/dL   Lab Results  Component Value Date   GLUCAP 209 (H) 12/07/2020   HGBA1C 14.3 (H) 12/05/2020    Review of Glycemic Control Results for Jeffrey Webb, Jeffrey Webb (MRN [id number]) as of 12/07/2020 12:21  Ref. Range 12/06/2020 06:08 12/06/2020 11:05 12/06/2020 16:02 12/06/2020 20:58 12/07/2020 06:14 12/07/2020 10:51  Glucose-Capillary Latest Ref Range: 70 - 99 mg/dL 279 (H) 206 (H) 335 (H) 186 (H) 291 (H) 209 (H)   Diabetes history: DM2 Outpatient Diabetes medications: Metformin 500 mg BID (not taking) Current orders for Inpatient glycemic control: Novolog 0-15 units TID with meals, Novolog 0-5 units QHS  Inpatient Diabetes Program Recommendations:     Please consider ordering 70/30 12 units BID (dose will provide a total of 16.8 units for basal and 7.2 units for meal coverage per day) along with correction TID and HS.  He is unsured and will need affordable insulin at discharge.    Note: Spoke with patient and finance at bedside.  Patient recently had pain medication and cannot keep eyes open.  Educated patient on DM2, basic pathophysiology, importance of controlled blood glucose and diet.  Discussed long and short term complications of elevated blood sugars.  She states she will stop buying sodas and sugary beverages.  She cooks at the home and educated her on The Plate Method.  LWWD booklet at bedside along with insulin pen starter kit.    Educated finance on insulin pen use at home. Reviewed contents of insulin flexpen starter kit. Reviewed all steps of insulin pen including attachment of needle, 2-unit air shot, dialing up dose, giving injection, removing needle, disposal of sharps, storage of unused insulin, disposal of insulin etc.   Will need follow up prior to discharge as patient needs 6 weeks of antibiotics and unsure of discharge date.    Will continue to follow while inpatient.  Thank you, Reche Dixon, RN, BSN Diabetes Coordinator Inpatient Diabetes Program [phone] (team pager from 8a-5p)

## 2020-12-07 NOTE — Progress Notes (Signed)
[facility] Infectious Diseases Follow Up Note  Patient Identification: Patient Name: Jeffrey Webb MRN: [id number] [place] Date: 12/04/2020  7:35 AM Age: 45 y.o.Today's Date: 12/07/2020   Reason for Visit: Follow-up on MSSA bacteremia  Active Problems:   Discitis of lumbar region   Psoas abscess, left ([place])   Type 2 diabetes mellitus with complication, without long-term current use of insulin (HCC)   Substance abuse (HCC)   Pseudohyponatremia   Antibiotics: Cefazolin 5/22-current  Lines/Tubes: PIV's  Interval Events: Afebrile, no leukocytosis, hemodynamically stable   Assessment MSSA bacteremia TTE with no vegetations  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 Thank you for the consult. Please page with pertinent questions or concerns.  ______________________________________________________________________ Subjective patient seen and examined at the bedside.  Lying in bed, complains of lower back pain 8 out of 10.  Also complains of difficulty moving the right great toe.  Denies any new numbness weakness or tingling.  Denies any stool or urine incontinence.   Vitals BP 120/82 (BP Location: Right Arm)   Pulse 86   Temp 99.3 F (37.4 C) (Oral)   Resp 15   Ht 5' 6"  (1.676 m)   Wt 88.5 kg   SpO2 100%   BMI 31.47 kg/m     Physical Exam Constitutional:   Lying in bed, not in acute distress    Comments:   Cardiovascular:     Rate and Rhythm: Normal rate and regular  rhythm.     Heart sounds: No murmur heard.   Pulmonary:     Effort: Pulmonary effort is normal.     Comments: No respiratory distress  Abdominal:     Palpations: Abdomen is soft.     Tenderness: Nontender and nondistended  Musculoskeletal:        General: No swelling or tenderness.  Lower back tenderness  Skin:    Comments: No lesions or rashes  Neurological:     General: Upper extremities power 5 x 5.  Lower extremity: Left leg power is 5/5 and right leg 3-4 out of 5.  Bilateral sensation intact  Psychiatric:        Mood and Affect: Mood normal.   Pertinent Microbiology Results for orders placed or performed during the hospital encounter of 12/04/20  Resp Panel by RT-PCR (Flu A&B, Covid) Nasopharyngeal Swab     Status: None   Collection Time: 12/04/20  6:42 PM   Specimen: Nasopharyngeal Swab; Nasopharyngeal(NP) swabs in vial transport medium  Result Value Ref Range Status   SARS Coronavirus 2 by RT PCR NEGATIVE NEGATIVE Final    Comment: (NOTE) SARS-CoV-2 target nucleic acids are NOT DETECTED.  The SARS-CoV-2 RNA is generally detectable in upper respiratory specimens during the acute phase of infection. The lowest concentration of SARS-CoV-2 viral copies this assay can detect is 138 copies/mL. A negative result does not preclude SARS-Cov-2 infection and should not be used as the sole basis for treatment or other patient management decisions. A negative result may occur with  improper specimen collection/handling, submission of specimen other than nasopharyngeal swab, presence of  viral mutation(s) within the areas targeted by this assay, and inadequate number of viral copies(<138 copies/mL). A negative result must be combined with clinical observations, patient history, and epidemiological information. The expected result is Negative.  Fact Sheet for Patients:  EntrepreneurPulse.com.au  Fact Sheet for Healthcare Providers:   IncredibleEmployment.be  This test is no t yet approved or cleared by the [place] FDA and  has been authorized for detection and/or diagnosis of SARS-CoV-2 by FDA under an Emergency Use Authorization (EUA). This EUA will remain  in effect (meaning this test can be used) for the duration of the COVID-19 declaration under Section 564(b)(1) of the Act, 21 U.S.C.section 360bbb-3(b)(1), unless the authorization is terminated  or revoked sooner.       Influenza A by PCR NEGATIVE NEGATIVE Final   Influenza B by PCR NEGATIVE NEGATIVE Final    Comment: (NOTE) The Xpert Xpress SARS-CoV-2/FLU/RSV plus assay is intended as an aid in the diagnosis of influenza from Nasopharyngeal swab specimens and should not be used as a sole basis for treatment. Nasal washings and aspirates are unacceptable for Xpert Xpress SARS-CoV-2/FLU/RSV testing.  Fact Sheet for Patients: EntrepreneurPulse.com.au  Fact Sheet for Healthcare Providers: IncredibleEmployment.be  This test is not yet approved or cleared by the [place] FDA and has been authorized for detection and/or diagnosis of SARS-CoV-2 by FDA under an Emergency Use Authorization (EUA). This EUA will remain in effect (meaning this test can be used) for the duration of the COVID-19 declaration under Section 564(b)(1) of the Act, 21 U.S.C. section 360bbb-3(b)(1), unless the authorization is terminated or revoked.  Performed at [facility], [place] [address], [place], [place] [address]   Blood culture (routine x 2)     Status: Abnormal (Preliminary result)   Collection Time: 12/04/20  8:46 PM   Specimen: BLOOD LEFT HAND  Result Value Ref Range Status   Specimen Description BLOOD LEFT HAND  Final   Special Requests   Final    BOTTLES DRAWN AEROBIC AND ANAEROBIC Blood Culture adequate volume   Culture  Setup Time   Final    GRAM POSITIVE COCCI AEROBIC BOTTLE ONLY CRITICAL VALUE  NOTED.  VALUE IS CONSISTENT WITH PREVIOUSLY REPORTED AND CALLED VALUE.    Culture (A)  Final    STAPHYLOCOCCUS AUREUS CULTURE REINCUBATED FOR BETTER GROWTH Performed at [facility], [place] [address] [place], [place] [address]    Report Status PENDING  Incomplete  Blood culture (routine x 2)     Status: Abnormal (Preliminary result)   Collection Time: 12/04/20  8:59 PM   Specimen: BLOOD  Result Value Ref Range Status   Specimen Description BLOOD LEFT ANTECUBITAL  Final   Special Requests   Final    BOTTLES DRAWN AEROBIC AND ANAEROBIC Blood Culture adequate volume   Culture  Setup Time   Final    GRAM POSITIVE COCCI IN BOTH AEROBIC AND ANAEROBIC BOTTLES CRITICAL RESULT CALLED TO, READ BACK BY AND VERIFIED WITH: ANDREW MEYER PHARMD @1600  12/05/20 EB    Culture (A)  Final    STAPHYLOCOCCUS AUREUS CULTURE REINCUBATED FOR BETTER GROWTH SUSCEPTIBILITIES TO FOLLOW Performed at [facility], [place] [address] [place], [place] [address]    Report Status PENDING  Incomplete  Blood Culture ID Panel (Reflexed)     Status: Abnormal   Collection Time: 12/04/20  8:59 PM  Result Value Ref Range Status   Enterococcus faecalis NOT DETECTED NOT DETECTED Final   Enterococcus Faecium NOT DETECTED NOT  DETECTED Final   Listeria monocytogenes NOT DETECTED NOT DETECTED Final   Staphylococcus species DETECTED (A) NOT DETECTED Final    Comment: CRITICAL RESULT CALLED TO, READ BACK BY AND VERIFIED WITH: ANDREW MEYER PHARMD @1600  12/05/20 EB    Staphylococcus aureus (BCID) DETECTED (A) NOT DETECTED Final    Comment: CRITICAL RESULT CALLED TO, READ BACK BY AND VERIFIED WITH: ANDREW MEYER PHARMD @1600  12/05/20 EB    Staphylococcus epidermidis NOT DETECTED NOT DETECTED Final   Staphylococcus lugdunensis NOT DETECTED NOT DETECTED Final   Streptococcus species NOT DETECTED NOT DETECTED Final   Streptococcus agalactiae NOT DETECTED NOT DETECTED Final   Streptococcus pneumoniae NOT DETECTED NOT  DETECTED Final   Streptococcus pyogenes NOT DETECTED NOT DETECTED Final   A.calcoaceticus-baumannii NOT DETECTED NOT DETECTED Final   Bacteroides fragilis NOT DETECTED NOT DETECTED Final   Enterobacterales NOT DETECTED NOT DETECTED Final   Enterobacter cloacae complex NOT DETECTED NOT DETECTED Final   Escherichia coli NOT DETECTED NOT DETECTED Final   Klebsiella aerogenes NOT DETECTED NOT DETECTED Final   Klebsiella oxytoca NOT DETECTED NOT DETECTED Final   Klebsiella pneumoniae NOT DETECTED NOT DETECTED Final   Proteus species NOT DETECTED NOT DETECTED Final   Salmonella species NOT DETECTED NOT DETECTED Final   Serratia marcescens NOT DETECTED NOT DETECTED Final   Haemophilus influenzae NOT DETECTED NOT DETECTED Final   Neisseria meningitidis NOT DETECTED NOT DETECTED Final   Pseudomonas aeruginosa NOT DETECTED NOT DETECTED Final   Stenotrophomonas maltophilia NOT DETECTED NOT DETECTED Final   Candida albicans NOT DETECTED NOT DETECTED Final   Candida auris NOT DETECTED NOT DETECTED Final   Candida glabrata NOT DETECTED NOT DETECTED Final   Candida krusei NOT DETECTED NOT DETECTED Final   Candida parapsilosis NOT DETECTED NOT DETECTED Final   Candida tropicalis NOT DETECTED NOT DETECTED Final   Cryptococcus neoformans/gattii NOT DETECTED NOT DETECTED Final   Meth resistant mecA/C and MREJ NOT DETECTED NOT DETECTED Final    Comment: Performed at [facility], [address], [place], [place] [address]  Culture, blood (routine x 2)     Status: None (Preliminary result)   Collection Time: 12/06/20  5:45 AM   Specimen: BLOOD RIGHT HAND  Result Value Ref Range Status   Specimen Description BLOOD RIGHT HAND  Final   Special Requests   Final    BOTTLES DRAWN AEROBIC AND ANAEROBIC Blood Culture adequate volume   Culture   Final    NO GROWTH 1 DAY Performed at [facility], [place] [address] [place], [place] [address]    Report Status PENDING  Incomplete  Culture, blood  (routine x 2)     Status: None (Preliminary result)   Collection Time: 12/06/20  5:53 AM   Specimen: BLOOD LEFT HAND  Result Value Ref Range Status   Specimen Description BLOOD LEFT HAND  Final   Special Requests   Final    BOTTLES DRAWN AEROBIC AND ANAEROBIC Blood Culture adequate volume   Culture   Final    NO GROWTH 1 DAY Performed at [facility], [place] [address] [place], [place] [address]    Report Status PENDING  Incomplete    Pertinent Lab. CBC Latest Ref Rng & Units 12/07/2020 12/06/2020 12/05/2020  WBC 4.0 - 10.5 K/uL 7.9 8.9 8.5  Hemoglobin 13.0 - 17.0 g/dL 13.3 13.1 12.7(L)  Hematocrit 39.0 - 52.0 % 40.5 39.9 39.7  Platelets 150 - 400 K/uL 364 418(H) 437(H)   Results for  orders placed or performed during the hospital encounter of 12/04/20  Resp Panel by RT-PCR (Flu A&B, Covid) Nasopharyngeal Swab     Status: None   Collection Time: 12/04/20  6:42 PM   Specimen: Nasopharyngeal Swab; Nasopharyngeal(NP) swabs in vial transport medium  Result Value Ref Range Status   SARS Coronavirus 2 by RT PCR NEGATIVE NEGATIVE Final    Comment: (NOTE) SARS-CoV-2 target nucleic acids are NOT DETECTED.  The SARS-CoV-2 RNA is generally detectable in upper respiratory specimens during the acute phase of infection. The lowest concentration of SARS-CoV-2 viral copies this assay can detect is 138 copies/mL. A negative result does not preclude SARS-Cov-2 infection and should not be used as the sole basis for treatment or other patient management decisions. A negative result may occur with  improper specimen collection/handling, submission of specimen other than nasopharyngeal swab, presence of viral mutation(s) within the areas targeted by this assay, and inadequate number of viral copies(<138 copies/mL). A negative result must be combined with clinical observations, patient history, and epidemiological information. The expected result is Negative.  Fact Sheet for Patients:   EntrepreneurPulse.com.au  Fact Sheet for Healthcare Providers:  IncredibleEmployment.be  This test is no t yet approved or cleared by the [place] FDA and  has been authorized for detection and/or diagnosis of SARS-CoV-2 by FDA under an Emergency Use Authorization (EUA). This EUA will remain  in effect (meaning this test can be used) for the duration of the COVID-19 declaration under Section 564(b)(1) of the Act, 21 U.S.C.section 360bbb-3(b)(1), unless the authorization is terminated  or revoked sooner.       Influenza A by PCR NEGATIVE NEGATIVE Final   Influenza B by PCR NEGATIVE NEGATIVE Final    Comment: (NOTE) The Xpert Xpress SARS-CoV-2/FLU/RSV plus assay is intended as an aid in the diagnosis of influenza from Nasopharyngeal swab specimens and should not be used as a sole basis for treatment. Nasal washings and aspirates are unacceptable for Xpert Xpress SARS-CoV-2/FLU/RSV testing.  Fact Sheet for Patients: EntrepreneurPulse.com.au  Fact Sheet for Healthcare Providers: IncredibleEmployment.be  This test is not yet approved or cleared by the [place] FDA and has been authorized for detection and/or diagnosis of SARS-CoV-2 by FDA under an Emergency Use Authorization (EUA). This EUA will remain in effect (meaning this test can be used) for the duration of the COVID-19 declaration under Section 564(b)(1) of the Act, 21 U.S.C. section 360bbb-3(b)(1), unless the authorization is terminated or revoked.  Performed at [facility], [place] [address] [place], [place] [address]   Blood culture (routine x 2)     Status: Abnormal (Preliminary result)   Collection Time: 12/04/20  8:46 PM   Specimen: BLOOD LEFT HAND  Result Value Ref Range Status   Specimen Description BLOOD LEFT HAND  Final   Special Requests   Final    BOTTLES DRAWN AEROBIC AND ANAEROBIC Blood Culture adequate volume    Culture  Setup Time   Final    GRAM POSITIVE COCCI AEROBIC BOTTLE ONLY CRITICAL VALUE NOTED.  VALUE IS CONSISTENT WITH PREVIOUSLY REPORTED AND CALLED VALUE.    Culture (A)  Final    STAPHYLOCOCCUS AUREUS CULTURE REINCUBATED FOR BETTER GROWTH Performed at [facility], [place] [address] [place], [place] [address]    Report Status PENDING  Incomplete  Blood culture (routine x 2)     Status: Abnormal (Preliminary result)   Collection Time: 12/04/20  8:59 PM   Specimen: BLOOD  Result Value Ref Range Status  Specimen Description BLOOD LEFT ANTECUBITAL  Final   Special Requests   Final    BOTTLES DRAWN AEROBIC AND ANAEROBIC Blood Culture adequate volume   Culture  Setup Time   Final    GRAM POSITIVE COCCI IN BOTH AEROBIC AND ANAEROBIC BOTTLES CRITICAL RESULT CALLED TO, READ BACK BY AND VERIFIED WITH: Hildred Laser PHARMD @1600  12/05/20 EB    Culture (A)  Final    STAPHYLOCOCCUS AUREUS CULTURE REINCUBATED FOR BETTER GROWTH SUSCEPTIBILITIES TO FOLLOW Performed at [facility], [place] [address], [place], [place] [address]    Report Status PENDING  Incomplete  Blood Culture ID Panel (Reflexed)     Status: Abnormal   Collection Time: 12/04/20  8:59 PM  Result Value Ref Range Status   Enterococcus faecalis NOT DETECTED NOT DETECTED Final   Enterococcus Faecium NOT DETECTED NOT DETECTED Final   Listeria monocytogenes NOT DETECTED NOT DETECTED Final   Staphylococcus species DETECTED (A) NOT DETECTED Final    Comment: CRITICAL RESULT CALLED TO, READ BACK BY AND VERIFIED WITH: ANDREW MEYER PHARMD @1600  12/05/20 EB    Staphylococcus aureus (BCID) DETECTED (A) NOT DETECTED Final    Comment: CRITICAL RESULT CALLED TO, READ BACK BY AND VERIFIED WITH: ANDREW MEYER PHARMD @1600  12/05/20 EB    Staphylococcus epidermidis NOT DETECTED NOT DETECTED Final   Staphylococcus lugdunensis NOT DETECTED NOT DETECTED Final   Streptococcus species NOT DETECTED NOT DETECTED Final   Streptococcus  agalactiae NOT DETECTED NOT DETECTED Final   Streptococcus pneumoniae NOT DETECTED NOT DETECTED Final   Streptococcus pyogenes NOT DETECTED NOT DETECTED Final   A.calcoaceticus-baumannii NOT DETECTED NOT DETECTED Final   Bacteroides fragilis NOT DETECTED NOT DETECTED Final   Enterobacterales NOT DETECTED NOT DETECTED Final   Enterobacter cloacae complex NOT DETECTED NOT DETECTED Final   Escherichia coli NOT DETECTED NOT DETECTED Final   Klebsiella aerogenes NOT DETECTED NOT DETECTED Final   Klebsiella oxytoca NOT DETECTED NOT DETECTED Final   Klebsiella pneumoniae NOT DETECTED NOT DETECTED Final   Proteus species NOT DETECTED NOT DETECTED Final   Salmonella species NOT DETECTED NOT DETECTED Final   Serratia marcescens NOT DETECTED NOT DETECTED Final   Haemophilus influenzae NOT DETECTED NOT DETECTED Final   Neisseria meningitidis NOT DETECTED NOT DETECTED Final   Pseudomonas aeruginosa NOT DETECTED NOT DETECTED Final   Stenotrophomonas maltophilia NOT DETECTED NOT DETECTED Final   Candida albicans NOT DETECTED NOT DETECTED Final   Candida auris NOT DETECTED NOT DETECTED Final   Candida glabrata NOT DETECTED NOT DETECTED Final   Candida krusei NOT DETECTED NOT DETECTED Final   Candida parapsilosis NOT DETECTED NOT DETECTED Final   Candida tropicalis NOT DETECTED NOT DETECTED Final   Cryptococcus neoformans/gattii NOT DETECTED NOT DETECTED Final   Meth resistant mecA/C and MREJ NOT DETECTED NOT DETECTED Final    Comment: Performed at [facility], [address], [place], [place] [address]  Culture, blood (routine x 2)     Status: None (Preliminary result)   Collection Time: 12/06/20  5:45 AM   Specimen: BLOOD RIGHT HAND  Result Value Ref Range Status   Specimen Description BLOOD RIGHT HAND  Final   Special Requests   Final    BOTTLES DRAWN AEROBIC AND ANAEROBIC Blood Culture adequate volume   Culture   Final    NO GROWTH 1 DAY Performed at [facility], [place] [address] [place], [place] [address]    Report Status PENDING  Incomplete  Culture, blood (routine  x 2)     Status: None (Preliminary result)   Collection Time: 12/06/20  5:53 AM   Specimen: BLOOD LEFT HAND  Result Value Ref Range Status   Specimen Description BLOOD LEFT HAND  Final   Special Requests   Final    BOTTLES DRAWN AEROBIC AND ANAEROBIC Blood Culture adequate volume   Culture   Final    NO GROWTH 1 DAY Performed at [facility], [place] [address], [place], [place] [address]    Report Status PENDING  Incomplete     Pertinent Imaging today Plain films and CT images have been personally visualized and interpreted; radiology reports have been reviewed. Decision making incorporated into the Impression / Recommendations.  MRI Lumbar spine 12/04/20 IMPRESSION: 1. Postcontrast images of the lumbar spine are consistent with discitis-osteomyelitis at the L2-3 level. No epidural phlegmon or abscess. 2. Asymmetric enlargement of the left psoas muscle centered at the L2-3 level with diffuse intramuscular enhancement. Fluid signal within the left psoas muscle without definite peripherally enhancing collection suggests myositis with phlegmon.  I have spent more than 35 minutes for this patient encounter including review of prior medical records, coordination of care  with greater than 50% of time being face to face/counseling and discussing diagnostics/treatment plan with the patient/family.  Electronically signed by:   Rosiland Oz, MD Infectious Disease Physician [facility] Pager: [phone]

## 2020-12-07 NOTE — Plan of Care (Signed)
Nutrition Education Note  RD consulted for nutrition education regarding uncontrolled diabetes.   Lab Results  Component Value Date   HGBA1C 14.3 (H) 12/05/2020   RD provided "Carbohydrate Counting for People with Diabetes" handout from the Academy of Nutrition and Dietetics. Discussed different food groups and their effects on blood sugar, emphasizing carbohydrate-containing foods. Provided list of carbohydrates and recommended serving sizes of common foods.  Spoke with wife as pt was asleep. Wife able to recall carbohydrate containing foods and took handout with gratitude. 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  Discussed importance of controlled and consistent carbohydrate intake throughout the day. Provided examples of ways to balance meals/snacks and encouraged intake of high-fiber, whole grain complex carbohydrates. Teach back method used.  Expect fair compliance.  Body mass index is 31.47 kg/m. Pt meets criteria for Obesity Class 1 based on current BMI.  Current diet order is Carb Modified, patient is consuming approximately 25-50% of meals at this time. Labs and medications reviewed.   No further nutrition interventions warranted at this time. RD contact information provided. If additional nutrition issues arise, please re-consult RD.  Vertell Limber, RD, LDN Registered Dietitian After Hours/Weekend Pager # in [place]

## 2020-12-07 NOTE — Progress Notes (Signed)
PROGRESS NOTE    Damiel Barthold  UXL:[id number] DOB: Apr 03, 1976 DOA: 12/04/2020 PCP: Patient, No Pcp Per (Inactive)   Brief Narrative:  Nima Kemppainen is a 45 y.o. male, with PMH of type 2 diabetes, substance abuse, tobacco abuse, who presented to the ER on 12/04/2020 with back pain for over 3 weeks. Patient states he has had ongoing back pain for the past 3 weeks.  The pain came on all of a sudden, denies any trauma, injury or fall.  Pain is worse with movement, but he has been ambulating okay. [facility]: Imaging obtained on admission: MRI and CT of abdomen pelvis and spine demonstrating findings concerning with L2 discitis and/or osteomyelitis and questionable psoas abscess. Neurosurgery was consulted - recommending IR guided biopsy and hold abx unless blood cultures return positive preliminarily.  Assessment & Plan:   Active Problems:   Discitis of lumbar region   Psoas abscess, left (HCC)   Type 2 diabetes mellitus with complication, without long-term current use of insulin (HCC)   Substance abuse (HCC)   Pseudohyponatremia  Acute discitis and/or osteomyelitis  Acute bacteremia with MSSA organism, POA Left psoas/deep tissue abscess less likely - In the setting of drug abuse - monitor narcotic use here closely - Imaging above demonstrated L2-L3 discitis/osteomyelitis - Neurosurgery contacted by the ER, recommend IR biopsy versus abscess drainage - IR indicates there is no fluid collection to drain - Cefazolin per ID based on blood cultures (presume spinal infection is the same) - Pain moderately controlled with morphine and oxycodone IR - transition off IV narcotics given history as quickly as tolerated -TTE shows no valvular involvement but incidentally noted grade 1 diastolic dysfunction; defer to ID if TEE is required for clearance  Uncontrolled type 2 diabetes with hyperglycemia - Glucose on admission 348 - A1c 14.3 - Continue sliding scale insulin, hypoglycemic protocol  Illicit  substance abuse - Urine drug screen positive for cocaine - Recommend cessation  Grade 1 diastolic dysfunction heart failure, EF 55 to 60%  -Incidentally noted on echo while evaluating for possible bacteremia sources  Tobacco abuse -Lengthy discussion on cessation - Nicotine patch prescribed  DVT prophylaxis: SCDs, early ambulation Code Status: Full Family Communication: Mother, father and girlfriend at bedside, lengthy discussion about patient's request for increasing narcotics which we discussed was not appropriate, discussed increased ambulation, disposition plans and possible need for further work-up given bacteremia diagnosis as above.  Status is: Inpatient  Dispo: The patient is from: Home              Anticipated d/c is to: To be determined              Anticipated d/c date is: 72+ hours pending clinical work-up              Patient currently not medically stable for discharge  Consultants:   Neurosurgery, interventional radiology  Procedures:   None indicated  Antimicrobials:  Cefazolin 5/22--> ongoing  Subjective: No acute issues or events overnight, pain moderately well controlled today  Objective: Vitals:   12/06/20 1222 12/06/20 1847 12/06/20 2339 12/07/20 0536  BP: 100/77 (!) 127/91 (!) 142/88 120/82  Pulse: 98 85 (!) 104 86  Resp: 16 16 18 15   Temp: 100 F (37.8 C) 98.9 F (37.2 C) 99.7 F (37.6 C) 99.3 F (37.4 C)  TempSrc: Oral Oral Oral Oral  SpO2: 99% 100% 99% 100%  Weight:      Height:        Intake/Output Summary (Last 24  hours) at 12/07/2020 0805 Last data filed at 12/07/2020 9767 Gross per 24 hour  Intake 820 ml  Output 1100 ml  Net -280 ml   Filed Weights   12/04/20 0800  Weight: 88.5 kg    Examination:  General: Resting comfortably in bed no acute distress, ambulated patient around the room with walker, he appears stiff but in no acute distress without overt pain HEENT:  Normocephalic atraumatic.  Sclerae nonicteric,  noninjected.  Extraocular movements intact bilaterally. Neck:  Without mass or deformity.  Trachea is midline. Lungs:  Clear to auscultate bilaterally without rhonchi, wheeze, or rales. Heart:  Regular rate and rhythm.  Without murmurs, rubs, or gallops. Abdomen:  Soft, nontender, nondistended.  Without guarding or rebound. Extremities: Without cyanosis, clubbing, edema, or obvious deformity. Vascular:  Dorsalis pedis and posterior tibial pulses palpable bilaterally. Skin:  Warm and dry, no erythema, no ulcerations.  Data Reviewed: I have personally reviewed following labs and imaging studies  CBC: Recent Labs  Lab 12/04/20 0915 12/05/20 0353 12/06/20 0553 12/07/20 0244  WBC 10.9* 8.5 8.9 7.9  NEUTROABS 7.9*  --   --   --   HGB 13.2 12.7* 13.1 13.3  HCT 40.6 39.7 39.9 40.5  MCV 80.9 82.5 80.4 80.8  PLT 462* 437* 418* 364   Basic Metabolic Panel: Recent Labs  Lab 12/04/20 0915 12/05/20 0353 12/06/20 0553 12/07/20 0244  NA 133* 135 133* 133*  K 4.2 4.0 4.0 3.9  CL 101 101 96* 99  CO2 23 26 26 26   GLUCOSE 348* 285* 294* 285*  BUN 13 15 10 10   CREATININE 0.98 0.91 0.97 0.91  CALCIUM 9.4 8.8* 9.3 9.5  MG  --  2.0  --   --   PHOS  --  4.2  --   --    GFR: Estimated Creatinine Clearance: 108 mL/min (by C-G formula based on SCr of 0.91 mg/dL). Liver Function Tests: Recent Labs  Lab 12/04/20 0915  AST 14*  ALT 20  ALKPHOS 104  BILITOT 0.3  PROT 8.3*  ALBUMIN 3.0*   Recent Labs  Lab 12/04/20 0915  LIPASE 24   No results for input(s): AMMONIA in the last 168 hours. Coagulation Profile: No results for input(s): INR, PROTIME in the last 168 hours. Cardiac Enzymes: No results for input(s): CKTOTAL, CKMB, CKMBINDEX, TROPONINI in the last 168 hours. BNP (last 3 results) No results for input(s): PROBNP in the last 8760 hours. HbA1C: Recent Labs    12/05/20 0353  HGBA1C 14.3*   CBG: Recent Labs  Lab 12/06/20 0608 12/06/20 1105 12/06/20 1602 12/06/20 2058  12/07/20 0614  GLUCAP 279* 206* 335* 186* 291*   Lipid Profile: No results for input(s): CHOL, HDL, LDLCALC, TRIG, CHOLHDL, LDLDIRECT in the last 72 hours. Thyroid Function Tests: No results for input(s): TSH, T4TOTAL, FREET4, T3FREE, THYROIDAB in the last 72 hours. Anemia Panel: No results for input(s): VITAMINB12, FOLATE, FERRITIN, TIBC, IRON, RETICCTPCT in the last 72 hours. Sepsis Labs: No results for input(s): PROCALCITON, LATICACIDVEN in the last 168 hours.  Recent Results (from the past 240 hour(s))  Urine C&S     Status: None   Collection Time: 12/04/20  5:06 AM   Specimen: Urine, Clean Catch  Result Value Ref Range Status   Specimen Description URINE, CLEAN CATCH  Final   Special Requests NONE  Final   Culture   Final    NO GROWTH Performed at [facility], [address], [place], [address] [place]  Report Status 12/05/2020 FINAL  Final  Resp Panel by RT-PCR (Flu A&B, Covid) Nasopharyngeal Swab     Status: None   Collection Time: 12/04/20  6:42 PM   Specimen: Nasopharyngeal Swab; Nasopharyngeal(NP) swabs in vial transport medium  Result Value Ref Range Status   SARS Coronavirus 2 by RT PCR NEGATIVE NEGATIVE Final    Comment: (NOTE) SARS-CoV-2 target nucleic acids are NOT DETECTED.  The SARS-CoV-2 RNA is generally detectable in upper respiratory specimens during the acute phase of infection. The lowest concentration of SARS-CoV-2 viral copies this assay can detect is 138 copies/mL. A negative result does not preclude SARS-Cov-2 infection and should not be used as the sole basis for treatment or other patient management decisions. A negative result may occur with  improper specimen collection/handling, submission of specimen other than nasopharyngeal swab, presence of viral mutation(s) within the areas targeted by this assay, and inadequate number of viral copies(<138 copies/mL). A negative result must be combined with clinical observations, patient history,  and epidemiological information. The expected result is Negative.  Fact Sheet for Patients:  BloggerCourse.comhttps://www.fda.gov/media/152166/download  Fact Sheet for Healthcare Providers:  SeriousBroker.ithttps://www.fda.gov/media/152162/download  This test is no t yet approved or cleared by the [place] FDA and  has been authorized for detection and/or diagnosis of SARS-CoV-2 by FDA under an Emergency Use Authorization (EUA). This EUA will remain  in effect (meaning this test can be used) for the duration of the COVID-19 declaration under Section 564(b)(1) of the Act, 21 U.S.C.section 360bbb-3(b)(1), unless the authorization is terminated  or revoked sooner.       Influenza A by PCR NEGATIVE NEGATIVE Final   Influenza B by PCR NEGATIVE NEGATIVE Final    Comment: (NOTE) The Xpert Xpress SARS-CoV-2/FLU/RSV plus assay is intended as an aid in the diagnosis of influenza from Nasopharyngeal swab specimens and should not be used as a sole basis for treatment. Nasal washings and aspirates are unacceptable for Xpert Xpress SARS-CoV-2/FLU/RSV testing.  Fact Sheet for Patients: BloggerCourse.comhttps://www.fda.gov/media/152166/download  Fact Sheet for Healthcare Providers: SeriousBroker.ithttps://www.fda.gov/media/152162/download  This test is not yet approved or cleared by the [place] FDA and has been authorized for detection and/or diagnosis of SARS-CoV-2 by FDA under an Emergency Use Authorization (EUA). This EUA will remain in effect (meaning this test can be used) for the duration of the COVID-19 declaration under Section 564(b)(1) of the Act, 21 U.S.C. section 360bbb-3(b)(1), unless the authorization is terminated or revoked.  Performed at [facility], [address] [place], [place] [address]   Blood culture (routine x 2)     Status: Abnormal (Preliminary result)   Collection Time: 12/04/20  8:46 PM   Specimen: BLOOD LEFT HAND  Result Value Ref Range Status   Specimen Description BLOOD LEFT HAND  Final   Special  Requests   Final    BOTTLES DRAWN AEROBIC AND ANAEROBIC Blood Culture adequate volume   Culture  Setup Time   Final    GRAM POSITIVE COCCI AEROBIC BOTTLE ONLY CRITICAL VALUE NOTED.  VALUE IS CONSISTENT WITH PREVIOUSLY REPORTED AND CALLED VALUE. Performed at [facility], [address] [place], [place] [address]    Culture STAPHYLOCOCCUS AUREUS (A)  Final   Report Status PENDING  Incomplete  Blood culture (routine x 2)     Status: Abnormal (Preliminary result)   Collection Time: 12/04/20  8:59 PM   Specimen: BLOOD  Result Value Ref Range Status   Specimen Description BLOOD LEFT ANTECUBITAL  Final   Special Requests  Final    BOTTLES DRAWN AEROBIC AND ANAEROBIC Blood Culture adequate volume   Culture  Setup Time   Final    GRAM POSITIVE COCCI IN BOTH AEROBIC AND ANAEROBIC BOTTLES CRITICAL RESULT CALLED TO, READ BACK BY AND VERIFIED WITH: [name] PHARMD  12/05/20 EB    Culture (A)  Final    STAPHYLOCOCCUS AUREUS SUSCEPTIBILITIES TO FOLLOW Performed at [facility], [address] [place], [place] [address]    Report Status PENDING  Incomplete  Blood Culture ID Panel (Reflexed)     Status: Abnormal   Collection Time: 12/04/20  8:59 PM  Result Value Ref Range Status   Enterococcus faecalis NOT DETECTED NOT DETECTED Final   Enterococcus Faecium NOT DETECTED NOT DETECTED Final   Listeria monocytogenes NOT DETECTED NOT DETECTED Final   Staphylococcus species DETECTED (A) NOT DETECTED Final    Comment: CRITICAL RESULT CALLED TO, READ BACK BY AND VERIFIED WITH: ANDREW MEYER PHARMD  12/05/20 EB    Staphylococcus aureus (BCID) DETECTED (A) NOT DETECTED Final    Comment: CRITICAL RESULT CALLED TO, READ BACK BY AND VERIFIED WITH: ANDREW MEYER PHARMD  12/05/20 EB    Staphylococcus epidermidis NOT DETECTED NOT DETECTED Final   Staphylococcus lugdunensis NOT DETECTED NOT DETECTED Final   Streptococcus species NOT DETECTED NOT DETECTED Final    Streptococcus agalactiae NOT DETECTED NOT DETECTED Final   Streptococcus pneumoniae NOT DETECTED NOT DETECTED Final   Streptococcus pyogenes NOT DETECTED NOT DETECTED Final   A.calcoaceticus-baumannii NOT DETECTED NOT DETECTED Final   Bacteroides fragilis NOT DETECTED NOT DETECTED Final   Enterobacterales NOT DETECTED NOT DETECTED Final   Enterobacter cloacae complex NOT DETECTED NOT DETECTED Final   Escherichia coli NOT DETECTED NOT DETECTED Final   Klebsiella aerogenes NOT DETECTED NOT DETECTED Final   Klebsiella oxytoca NOT DETECTED NOT DETECTED Final   Klebsiella pneumoniae NOT DETECTED NOT DETECTED Final   Proteus species NOT DETECTED NOT DETECTED Final   Salmonella species NOT DETECTED NOT DETECTED Final   Serratia marcescens NOT DETECTED NOT DETECTED Final   Haemophilus influenzae NOT DETECTED NOT DETECTED Final   Neisseria meningitidis NOT DETECTED NOT DETECTED Final   Pseudomonas aeruginosa NOT DETECTED NOT DETECTED Final   Stenotrophomonas maltophilia NOT DETECTED NOT DETECTED Final   Candida albicans NOT DETECTED NOT DETECTED Final   Candida auris NOT DETECTED NOT DETECTED Final   Candida glabrata NOT DETECTED NOT DETECTED Final   Candida krusei NOT DETECTED NOT DETECTED Final   Candida parapsilosis NOT DETECTED NOT DETECTED Final   Candida tropicalis NOT DETECTED NOT DETECTED Final   Cryptococcus neoformans/gattii NOT DETECTED NOT DETECTED Final   Meth resistant mecA/C and MREJ NOT DETECTED NOT DETECTED Final    Comment: Performed at [facility], [address] [place], [place] [address]         Radiology Studies: ECHOCARDIOGRAM COMPLETE  Result Date: 12/06/2020    ECHOCARDIOGRAM REPORT   Patient Name:   THEODORE VIRGIN Date of Exam: 12/06/2020 Medical Rec #:  [id number]    Height:       66.0 in Accession #:    [id number]   Weight:       195.0 lb Date of Birth:  03-04-1976    BSA:          1.978 m Patient Age:    45 years     BP:           125/85 mmHg Patient  Gender: M  HR:           98 bpm. Exam Location:  Inpatient Procedure: 2D Echo, Cardiac Doppler and Color Doppler Indications:    Bacteremia R78.81  History:        Patient has no prior history of Echocardiogram examinations.  Sonographer:    Elmarie Shiley Dance Referring Phys: [id number] Joshus W COMER IMPRESSIONS  1. Left ventricular ejection fraction, by estimation, is 55 to 60%. The left ventricle has normal function. The left ventricle has no regional wall motion abnormalities. Left ventricular diastolic parameters are consistent with Grade I diastolic dysfunction (impaired relaxation).  2. Right ventricular systolic function is normal. The right ventricular size is normal. Tricuspid regurgitation signal is inadequate for assessing PA pressure.  3. The mitral valve is normal in structure. No evidence of mitral valve regurgitation. No evidence of mitral stenosis.  4. The aortic valve is tricuspid. Aortic valve regurgitation is not visualized. No aortic stenosis is present.  5. The inferior vena cava is normal in size with greater than 50% respiratory variability, suggesting right atrial pressure of 3 mmHg.  6. No valvular vegetation noted. FINDINGS  Left Ventricle: Left ventricular ejection fraction, by estimation, is 55 to 60%. The left ventricle has normal function. The left ventricle has no regional wall motion abnormalities. The left ventricular internal cavity size was normal in size. There is  no left ventricular hypertrophy. Left ventricular diastolic parameters are consistent with Grade I diastolic dysfunction (impaired relaxation). Right Ventricle: The right ventricular size is normal. No increase in right ventricular wall thickness. Right ventricular systolic function is normal. Tricuspid regurgitation signal is inadequate for assessing PA pressure. Left Atrium: Left atrial size was normal in size. Right Atrium: Right atrial size was normal in size. Pericardium: Trivial pericardial effusion is present.  Mitral Valve: The mitral valve is normal in structure. No evidence of mitral valve regurgitation. No evidence of mitral valve stenosis. Tricuspid Valve: The tricuspid valve is normal in structure. Tricuspid valve regurgitation is not demonstrated. Aortic Valve: The aortic valve is tricuspid. Aortic valve regurgitation is not visualized. No aortic stenosis is present. Pulmonic Valve: The pulmonic valve was normal in structure. Pulmonic valve regurgitation is trivial. Aorta: The aortic root is normal in size and structure. Venous: The inferior vena cava is normal in size with greater than 50% respiratory variability, suggesting right atrial pressure of 3 mmHg. IAS/Shunts: No atrial level shunt detected by color flow Doppler.  LEFT VENTRICLE PLAX 2D LVIDd:         3.90 cm  Diastology LVIDs:         2.50 cm  LV e' medial:    5.77 cm/s LV PW:         0.90 cm  LV E/e' medial:  8.7 LV IVS:        1.10 cm  LV e' lateral:   9.79 cm/s LVOT diam:     2.00 cm  LV E/e' lateral: 5.1 LV SV:         39 LV SV Index:   20 LVOT Area:     3.14 cm  RIGHT VENTRICLE             IVC RV Basal diam:  2.10 cm     IVC diam: 1.20 cm RV S prime:     16.10 cm/s TAPSE (M-mode): 1.7 cm LEFT ATRIUM             Index       RIGHT ATRIUM  Index LA diam:        2.70 cm 1.36 cm/m  RA Area:     6.15 cm LA Vol (A2C):   21.5 ml 10.87 ml/m RA Volume:   8.28 ml  4.19 ml/m LA Vol (A4C):   16.5 ml 8.34 ml/m LA Biplane Vol: 20.2 ml 10.21 ml/m  AORTIC VALVE LVOT Vmax:   89.70 cm/s LVOT Vmean:  57.200 cm/s LVOT VTI:    0.124 m  AORTA Ao Root diam: 3.30 cm Ao Asc diam:  2.50 cm MITRAL VALVE MV Area (PHT): 4.49 cm    SHUNTS MV Decel Time: 169 msec    Systemic VTI:  0.12 m MV E velocity: 50.00 cm/s  Systemic Diam: 2.00 cm MV A velocity: 55.30 cm/s MV E/A ratio:  0.90 Marca Ancona MD Electronically signed by Marca Ancona MD Signature Date/Time: 12/06/2020/7:57:10 PM    Final    Scheduled Meds: . insulin aspart  0-15 Units Subcutaneous TID WC  .  insulin aspart  0-5 Units Subcutaneous QHS  . multivitamin with minerals  1 tablet Oral Daily  . nicotine  14 mg Transdermal Daily   Continuous Infusions: .  ceFAZolin (ANCEF) IV 2 g (12/07/20 0528)     LOS: 3 days    Time spent:  Azucena Fallen, DO [facility]  If 7PM-7AM, please contact night-coverage [email or web address]  12/07/2020, 8:05 AM

## 2020-12-08 DIAGNOSIS — R1032 Left lower quadrant pain: Secondary | ICD-10-CM

## 2020-12-08 LAB — CULTURE, BLOOD (ROUTINE X 2)
Special Requests: ADEQUATE
Special Requests: ADEQUATE

## 2020-12-08 LAB — GLUCOSE, CAPILLARY
Glucose-Capillary: 297 mg/dL — ABNORMAL HIGH (ref 70–99)
Glucose-Capillary: 281 mg/dL — ABNORMAL HIGH (ref 70–99)
Glucose-Capillary: 176 mg/dL — ABNORMAL HIGH (ref 70–99)
Glucose-Capillary: 220 mg/dL — ABNORMAL HIGH (ref 70–99)

## 2020-12-08 LAB — CBC
HCT: 41.5 % (ref 39.0–52.0)
Hemoglobin: 13.5 g/dL (ref 13.0–17.0)
MCH: 26.4 pg (ref 26.0–34.0)
MCHC: 32.5 g/dL (ref 30.0–36.0)
MCV: 81.2 fL (ref 80.0–100.0)
Platelets: 325 10*3/uL (ref 150–400)
RBC: 5.11 MIL/uL (ref 4.22–5.81)
RDW: 12.1 % (ref 11.5–15.5)
WBC: 7.6 10*3/uL (ref 4.0–10.5)
nRBC: 0 % (ref 0.0–0.2)

## 2020-12-08 LAB — BASIC METABOLIC PANEL
Anion gap: 10 (ref 5–15)
BUN: 10 mg/dL (ref 6–20)
CO2: 25 mmol/L (ref 22–32)
Calcium: 9.2 mg/dL (ref 8.9–10.3)
Chloride: 98 mmol/L (ref 98–111)
Creatinine, Ser: 0.92 mg/dL (ref 0.61–1.24)
GFR, Estimated: >60 mL/min (ref >60)
Glucose, Bld: 279 mg/dL — ABNORMAL HIGH (ref 70–99)
Potassium: 4 mmol/L (ref 3.5–5.1)
Sodium: 133 mmol/L — ABNORMAL LOW (ref 135–145)

## 2020-12-08 LAB — SEDIMENTATION RATE: Sed Rate: 58 mm/hr — ABNORMAL HIGH (ref 0–16)

## 2020-12-08 LAB — HEPATITIS C ANTIBODY: HCV Ab: NONREACTIVE

## 2020-12-08 LAB — C-REACTIVE PROTEIN: CRP: 10.6 mg/dL — ABNORMAL HIGH (ref <1.0)

## 2020-12-08 MED ORDER — OXYCODONE HCL 5 MG PO TABS
10.0000 mg | ORAL_TABLET | ORAL | Status: DC | PRN
  Administered 2020-12-08 – 2020-12-20 (×35): 10 mg via ORAL
  Filled 2020-12-08 (×36): qty 2

## 2020-12-08 MED ORDER — METHOCARBAMOL 500 MG PO TABS
1000.0000 mg | ORAL_TABLET | Freq: Four times a day (QID) | ORAL | Status: DC | PRN
  Administered 2020-12-08 – 2020-12-10 (×5): 1000 mg via ORAL
  Filled 2020-12-08 (×5): qty 2

## 2020-12-08 MED ORDER — INSULIN ASPART PROT & ASPART (70-30 MIX) 100 UNIT/ML ~~LOC~~ SUSP
10.0000 [IU] | Freq: Two times a day (BID) | SUBCUTANEOUS | Status: DC
  Administered 2020-12-08 – 2020-12-09 (×3): 10 [IU] via SUBCUTANEOUS
  Filled 2020-12-08: qty 10

## 2020-12-08 MED ORDER — INSULIN DETEMIR 100 UNIT/ML ~~LOC~~ SOLN: 12.0000 [IU] | Freq: Every day | SUBCUTANEOUS | Status: DC

## 2020-12-08 MED ORDER — ENOXAPARIN SODIUM 40 MG/0.4ML IJ SOSY
40.0000 mg | PREFILLED_SYRINGE | INTRAMUSCULAR | Status: DC
  Administered 2020-12-08 – 2020-12-10 (×2): 40 mg via SUBCUTANEOUS
  Filled 2020-12-08 (×4): qty 0.4

## 2020-12-08 NOTE — Progress Notes (Signed)
Inpatient Diabetes Program Recommendations  AACE/ADA: New Consensus Statement on Inpatient Glycemic Control   Target Ranges:  Prepandial:   less than 140 mg/dL      Peak postprandial:   less than 180 mg/dL (1-2 hours)      Critically ill patients:  140 - 180 mg/dL   Results for Jeffrey Webb, Jeffrey Webb (MRN [id number]) as of 12/08/2020 10:51  Ref. Range 12/07/2020 06:14 12/07/2020 10:51 12/07/2020 16:05 12/07/2020 21:40 12/08/2020 06:08  Glucose-Capillary Latest Ref Range: 70 - 99 mg/dL 567 (H)  Novolog 8 units 209 (H)  Novolog 5 units 218 (H)  Novolog 5 units 242 (H)  Novolog 2 units 297 (H)  Novolog 8 units   Review of Glycemic Control  Diabetes history: DM2 Outpatient Diabetes medications: Metformin 500 mg BID (not taking) Current orders for Inpatient glycemic control: Novolog 0-15 units TID with meals, Novolog 0-5 units QHS  Inpatient Diabetes Program Recommendations:    Insulin: Please consider ordering 70/30 12 units BID (dose will provide a total of 16.8 units for basal and 7.2 units for meal coverage per day).  HbgA1C:  A1C 14.3% on 12/05/20 indicating an average glucose of 364 mg/dl over the past 2-3 months. Patient will need to be discharged on insulin but is uninsured so will need affordable insulin such as Novolin 70/30.  Thanks, Orlando Penner, RN, MSN, CDE Diabetes Coordinator Inpatient Diabetes Program [phone] (Team Pager from 8am to 5pm)

## 2020-12-08 NOTE — Progress Notes (Signed)
Pharmacy Antibiotic Note  Jeffrey Webb is a 45 y.o. male  with L2-L3  Discitis/osteomyelitis and staph (no resistance detected on BCID) bacteremia. Pharmacy has been consulted for ancef dosing. ID is following.  -Blood cultures staph aureus (BCID - MSSA), sensitivities still pending on culture. TTE negative for vegetations.  -CrCl >100, WBC within normal limits, Afebrile.   Plan: Continue Ancef 2gm IV q8h Will follow renal function and clinical progress Follow-up repeat blood cultures for clearance Follow-up length of therapy - will need long-term with vertebral infection  *Recommend increase insulin coverage for CBG control (see DM coordinator recommendations)  Height: 5\' 6"  (167.6 cm) Weight: 88.5 kg (195 lb) IBW/kg (Calculated) : 63.8  Temp (24hrs), Avg:98.7 F (37.1 C), Min:98.4 F (36.9 C), Max:99.3 F (37.4 C)  Recent Labs  Lab 12/04/20 0915 12/05/20 0353 12/06/20 0553 12/07/20 0244  WBC 10.9* 8.5 8.9 7.9  CREATININE 0.98 0.91 0.97 0.91    Estimated Creatinine Clearance: 108 mL/min (by C-G formula based on SCr of 0.91 mg/dL).    No Known Allergies  Antimicrobials this admission: 5/22 ancef  Dose adjustments this admission:   Microbiology results: 5/22 blood x2- MSSA   Thank you for allowing pharmacy to be a part of this patient's care.  6/22, PharmD, BCPS, BCCCP Clinical Pharmacist Please refer to [facility] for [facility] Pharmacy numbers 12/08/2020, 8:54 AM

## 2020-12-08 NOTE — Evaluation (Signed)
Physical Therapy Evaluation Patient Details Name: Jeffrey Webb MRN: [id number] DOB: 1976/04/22 Today's Date: 12/08/2020   History of Present Illness  45 y.o. male, Presented to the ER on 12/04/2020 with back pain for over 3 weeks. MRI and CT of abdomen pelvis and spine demonstrating findings concerning with L2 discitis and/or osteomyelitis and questionable psoas abscess. PMH of type 2 diabetes, substance abuse, tobacco abuse  Clinical Impression  PTA pt living with girlfriend in single story home with 6 steps to enter. Pt utilizes wheelchair for mobility, has cousins carry him in and out of house. Pt's girlfriend assists with bathing and dressing and performs all iADLs. Pt is currently very limited by LBP. Pt is min guard for bed mobility, and transfers, requires min guard- min A for safety with antalgic gait with RW. PT recommending HHPT at discharge to progress strength and endurance. PT will continue to follow acutely.     Follow Up Recommendations Home health PT;Supervision for mobility/OOB    Equipment Recommendations  Rolling walker with 5" wheels    Recommendations for Other Services OT consult     Precautions / Restrictions Precautions Precautions: Fall Restrictions Weight Bearing Restrictions: No      Mobility  Bed Mobility Overal bed mobility: Needs Assistance Bed Mobility: Supine to Sit     Supine to sit: Min guard     General bed mobility comments: min guard for safety    Transfers Overall transfer level: Needs assistance Equipment used: Rolling walker (2 wheeled) Transfers: Sit to/from Stand Sit to Stand: Min guard         General transfer comment: min guard for safety  Ambulation/Gait Ambulation/Gait assistance: Min guard;Min assist Gait Distance (Feet): 20 Feet Assistive device: Rolling walker (2 wheeled) Gait Pattern/deviations: Antalgic;Step-to pattern;Decreased stance time - left;Decreased weight shift to left Gait velocity: extremely slow Gait  velocity interpretation: <1.31 ft/sec, indicative of household ambulator General Gait Details: min guard for slow, antalgic gait, leads with R foot and decreased weight shift to L vc for proximity to RW        Balance Overall balance assessment: Needs assistance Sitting-balance support: Feet supported Sitting balance-[name]: Fair     Standing balance support: Bilateral upper extremity supported Standing balance-[name]: Fair                               Pertinent Vitals/Pain Pain Assessment: 0-10 Pain Score: 7  Pain Location: low back pain, L hip Pain Descriptors / Indicators: Throbbing;Aching Pain Intervention(s): Limited activity within patient's tolerance;Monitored during session;Repositioned    Home Living Family/patient expects to be discharged to:: Private residence Living Arrangements: Spouse/significant other Available Help at Discharge: Family;Available 24 hours/day Type of Home: House Home Access: Stairs to enter   [employer] of Steps: 6 Home Layout: One level Home Equipment: Wheelchair - manual      Prior Function Level of Independence: Needs assistance   Gait / Transfers Assistance Needed: uses wheelchair, cousins carry him in and out of house  ADL's / Homemaking Assistance Needed: girlfriend assists with bathing, and does all iADLs        Hand Dominance   Dominant Hand: Right    Extremity/Trunk Assessment   Upper Extremity Assessment Upper Extremity Assessment: Overall WFL for tasks assessed    Lower Extremity Assessment Lower Extremity Assessment: RLE deficits/detail;LLE deficits/detail RLE Deficits / Details: ROM limited by pain RLE: Unable to fully assess due to pain RLE Coordination: decreased fine  motor LLE Deficits / Details: ROM limited by pain LLE: Unable to fully assess due to pain LLE Sensation: decreased light touch    Cervical / Trunk Assessment Cervical / Trunk Assessment: Other exceptions  (back pain)  Communication   Communication: No difficulties  Cognition Arousal/Alertness: Awake/alert Behavior During Therapy: WFL for tasks assessed/performed Overall Cognitive Status: Within Functional Limits for tasks assessed                                 General Comments: poor safety insight      General Comments General comments (skin integrity, edema, etc.): VSS on RA        Assessment/Plan    PT Assessment Patient needs continued PT services  PT Problem List Decreased strength;Decreased range of motion;Decreased activity tolerance;Decreased balance;Decreased mobility;Decreased safety awareness;Pain       PT Treatment Interventions DME instruction;Gait training;Stair training;Functional mobility training;Therapeutic activities;Therapeutic exercise;Balance training;Cognitive remediation    PT Goals (Current goals can be found in the Care Plan section)  Acute Rehab PT Goals Patient Stated Goal: have less pain PT Goal Formulation: With patient Time For Goal Achievement: 12/22/20 Potential to Achieve Goals: Fair    Frequency Min 3X/week    AM-PAC PT "6 Clicks" Mobility  Outcome Measure Help needed turning from your back to your side while in a flat bed without using bedrails?: None Help needed moving from lying on your back to sitting on the side of a flat bed without using bedrails?: None Help needed moving to and from a bed to a chair (including a wheelchair)?: A Little Help needed standing up from a chair using your arms (e.g., wheelchair or bedside chair)?: A Little Help needed to walk in hospital room?: A Little Help needed climbing 3-5 steps with a railing? : A Lot 6 Click Score: 19    End of Session Equipment Utilized During Treatment: Gait belt Activity Tolerance: Patient limited by pain Patient left: in chair;with call bell/phone within reach Nurse Communication: Mobility status;Patient requests pain meds PT Visit Diagnosis: Other  abnormalities of gait and mobility (R26.89);Muscle weakness (generalized) (M62.81);Difficulty in walking, not elsewhere classified (R26.2);Pain Pain - Right/Left: Left Pain - part of body: Hip (back)    Time: 8676-7209 PT Time Calculation (min) (ACUTE ONLY): 25 min   Charges:   PT Evaluation $PT Eval Moderate Complexity: 1 Mod PT Treatments $Gait Training: 8-22 mins        Arrow Emmerich B. Beverely Risen PT, DPT Acute Rehabilitation Services Pager [phone] Office [phone]   [name] 12/08/2020, 10:32 AM

## 2020-12-08 NOTE — Progress Notes (Addendum)
PROGRESS NOTE  Jeffrey Webb RWE:[id number] DOB: [birth year] DOA: 12/04/2020 PCP: Patient, No Pcp Per (Inactive)   LOS: 4 days   Brief Narrative / Interim history: 45 year old male with DM2, substance abuse with cocaine but no IVDU per patient, tobacco use who comes into the hospital with worsening back pain over several weeks.  He denies any trauma, injury or falls.  An MRI in the ED showed findings concerning for L2 discitis/osteomyelitis and questionable psoas abscess.  Neurosurgery, ID consulted, underwent IR guided biopsy  Subjective / 24h Interval events: Complains of back pain, no chest pain, no shortness of breath.  Assessment & Plan: Principal Problem MSSA bacteremia with acute discitis/osteomyelitis -ID consulted, following.  Currently on cefazolin will likely need several weeks of antibiotics.  He has a history of drug use so not a good candidate for PICC line.  IR was consulted for biopsy versus abscess drainage but IR indicated that there is no fluid collection to drain.  There was concern about left psoas/deep tissue abscess but this is less likely. -TTE showed no valvular involvement, no need for TEE since treatment guidelines for discitis will cover time required for presumed endocarditis  Active Problems Type 2 diabetes mellitus, uncontrolled, with hyperglycemia-A1c 14.3.  Continue sliding scale, appreciate diabetes coordinator.  Will place on 70/30 due to concern for cost upon discharge  Illicit substance use-UDS positive for cocaine, patient denies any intravenous drug use as he "hates needles"  Chronic diastolic CHF-2D echo showed grade 1 diastolic dysfunction, EF 55-60%.  Appears euvolemic  Tobacco use-continue nicotine patch  Back pain-due to #1, have discontinued IV morphine and increased oral oxygen  Scheduled Meds: . insulin aspart  0-15 Units Subcutaneous TID WC  . insulin aspart  0-5 Units Subcutaneous QHS  . insulin aspart protamine- aspart  10 Units  Subcutaneous BID WC  . multivitamin with minerals  1 tablet Oral Daily  . nicotine  14 mg Transdermal Daily  . polyethylene glycol  17 g Oral BID   Continuous Infusions: .  ceFAZolin (ANCEF) IV 2 g (12/08/20 0546)   PRN Meds:.acetaminophen **OR** acetaminophen, albuterol, methocarbamol, ondansetron **OR** ondansetron (ZOFRAN) IV, oxyCODONE, senna-docusate  Diet Orders (From admission, onward)    Start     Ordered   12/04/20 2249  Diet Carb Modified Fluid consistency: Thin; Room service appropriate? Yes  Diet effective now       Question Answer Comment  Diet-HS Snack? Nothing   Calorie Level Medium 1600-2000   Fluid consistency: Thin   Room service appropriate? Yes      12/04/20 2249          DVT prophylaxis: SCDs Start: 12/04/20 2249     Code Status: Full Code  Family Communication: No family at bedside  Status is: Inpatient  Remains inpatient appropriate because:Inpatient level of care appropriate due to severity of illness   Dispo: The patient is from: Home              Anticipated d/c is to: Home              Patient currently is not medically stable to d/c.   Difficult to place patient Yes  Level of care: Med-Surg  Consultants:  ID  Procedures:  2D echo  Microbiology  MSSA bacteremia  Antimicrobials: Cefazolin   Objective: Vitals:   12/07/20 1144 12/07/20 1801 12/08/20 0043 12/08/20 0459  BP: (!) 139/41 127/86 120/86 (!) 128/91  Pulse: (!) 115 88 100 (!) 106  Resp: 18 18 18  18  Temp: 98.4 F (36.9 C) 98.4 F (36.9 C) 99.3 F (37.4 C) 98.8 F (37.1 C)  TempSrc: Oral Oral Oral Oral  SpO2: 100% 100% 98% 97%  Weight:      Height:        Intake/Output Summary (Last 24 hours) at 12/08/2020 1121 Last data filed at 12/08/2020 0506 Gross per 24 hour  Intake --  Output 500 ml  Net -500 ml   Filed Weights   12/04/20 0800  Weight: 88.5 kg    Examination:  Constitutional: NAD Eyes: no scleral icterus ENMT: Mucous membranes are moist.   Neck: normal, supple Respiratory: clear to auscultation bilaterally, no wheezing, no crackles. Normal respiratory effort.  Cardiovascular: Regular rate and rhythm, no murmurs / rubs / gallops. No LE edema. Abdomen: non distended, no tenderness. Bowel sounds positive.  Musculoskeletal: no clubbing / cyanosis.  Skin: no rashes Neurologic: CN 2-12 grossly intact. Strength 5/5 in all 4.  Psychiatric: Normal judgment and insight. Alert and oriented x 3. Normal mood.   Data Reviewed: I have independently reviewed following labs and imaging studies  CBC: Recent Labs  Lab 12/04/20 0915 12/05/20 0353 12/06/20 0553 12/07/20 0244 12/08/20 0908  WBC 10.9* 8.5 8.9 7.9 7.6  NEUTROABS 7.9*  --   --   --   --   HGB 13.2 12.7* 13.1 13.3 13.5  HCT 40.6 39.7 39.9 40.5 41.5  MCV 80.9 82.5 80.4 80.8 81.2  PLT 462* 437* 418* 364 325   Basic Metabolic Panel: Recent Labs  Lab 12/04/20 0915 12/05/20 0353 12/06/20 0553 12/07/20 0244 12/08/20 0908  NA 133* 135 133* 133* 133*  K 4.2 4.0 4.0 3.9 4.0  CL 101 101 96* 99 98  CO2 23 26 26 26 25   GLUCOSE 348* 285* 294* 285* 279*  BUN 13 15 10 10 10   CREATININE 0.98 0.91 0.97 0.91 0.92  CALCIUM 9.4 8.8* 9.3 9.5 9.2  MG  --  2.0  --   --   --   PHOS  --  4.2  --   --   --    Liver Function Tests: Recent Labs  Lab 12/04/20 0915  AST 14*  ALT 20  ALKPHOS 104  BILITOT 0.3  PROT 8.3*  ALBUMIN 3.0*   Coagulation Profile: No results for input(s): INR, PROTIME in the last 168 hours. HbA1C: No results for input(s): HGBA1C in the last 72 hours. CBG: Recent Labs  Lab 12/07/20 1051 12/07/20 1605 12/07/20 2140 12/08/20 0608 12/08/20 1101  GLUCAP 209* 218* 242* 297* 281*    Recent Results (from the past 240 hour(s))  Urine C&S     Status: None   Collection Time: 12/04/20  5:06 AM   Specimen: Urine, Clean Catch  Result Value Ref Range Status   Specimen Description URINE, CLEAN CATCH  Final   Special Requests NONE  Final   Culture    Final    NO GROWTH Performed at [facility], [address], [place], [address] [place]    Report Status 12/05/2020 FINAL  Final  Resp Panel by RT-PCR (Flu A&B, Covid) Nasopharyngeal Swab     Status: None   Collection Time: 12/04/20  6:42 PM   Specimen: Nasopharyngeal Swab; Nasopharyngeal(NP) swabs in vial transport medium  Result Value Ref Range Status   SARS Coronavirus 2 by RT PCR NEGATIVE NEGATIVE Final    Comment: (NOTE) SARS-CoV-2 target nucleic acids are NOT DETECTED.  The SARS-CoV-2 RNA is generally detectable in upper respiratory specimens during  the acute phase of infection. The lowest concentration of SARS-CoV-2 viral copies this assay can detect is 138 copies/mL. A negative result does not preclude SARS-Cov-2 infection and should not be used as the sole basis for treatment or other patient management decisions. A negative result may occur with  improper specimen collection/handling, submission of specimen other than nasopharyngeal swab, presence of viral mutation(s) within the areas targeted by this assay, and inadequate number of viral copies(<138 copies/mL). A negative result must be combined with clinical observations, patient history, and epidemiological information. The expected result is Negative.  Fact Sheet for Patients:  BloggerCourse.com  Fact Sheet for Healthcare Providers:  SeriousBroker.it  This test is no t yet approved or cleared by the [place] FDA and  has been authorized for detection and/or diagnosis of SARS-CoV-2 by FDA under an Emergency Use Authorization (EUA). This EUA will remain  in effect (meaning this test can be used) for the duration of the COVID-19 declaration under Section 564(b)(1) of the Act, 21 U.S.C.section 360bbb-3(b)(1), unless the authorization is terminated  or revoked sooner.       Influenza A by PCR NEGATIVE NEGATIVE Final   Influenza B by PCR NEGATIVE NEGATIVE  Final    Comment: (NOTE) The Xpert Xpress SARS-CoV-2/FLU/RSV plus assay is intended as an aid in the diagnosis of influenza from Nasopharyngeal swab specimens and should not be used as a sole basis for treatment. Nasal washings and aspirates are unacceptable for Xpert Xpress SARS-CoV-2/FLU/RSV testing.  Fact Sheet for Patients: BloggerCourse.com  Fact Sheet for Healthcare Providers: SeriousBroker.it  This test is not yet approved or cleared by the [place] FDA and has been authorized for detection and/or diagnosis of SARS-CoV-2 by FDA under an Emergency Use Authorization (EUA). This EUA will remain in effect (meaning this test can be used) for the duration of the COVID-19 declaration under Section 564(b)(1) of the Act, 21 U.S.C. section 360bbb-3(b)(1), unless the authorization is terminated or revoked.  Performed at [facility], [address], [place], [place] [address]   Blood culture (routine x 2)     Status: Abnormal   Collection Time: 12/04/20  8:46 PM   Specimen: BLOOD LEFT HAND  Result Value Ref Range Status   Specimen Description BLOOD LEFT HAND  Final   Special Requests   Final    BOTTLES DRAWN AEROBIC AND ANAEROBIC Blood Culture adequate volume   Culture  Setup Time   Final    GRAM POSITIVE COCCI IN BOTH AEROBIC AND ANAEROBIC BOTTLES CRITICAL VALUE NOTED.  VALUE IS CONSISTENT WITH PREVIOUSLY REPORTED AND CALLED VALUE.    Culture (A)  Final    STAPHYLOCOCCUS AUREUS SUSCEPTIBILITIES PERFORMED ON PREVIOUS CULTURE WITHIN THE LAST 5 DAYS. Performed at [facility], [address] [place], [place] [address]    Report Status 12/08/2020 FINAL  Final  Blood culture (routine x 2)     Status: Abnormal   Collection Time: 12/04/20  8:59 PM   Specimen: BLOOD  Result Value Ref Range Status   Specimen Description BLOOD LEFT ANTECUBITAL  Final   Special Requests   Final    BOTTLES DRAWN AEROBIC AND ANAEROBIC  Blood Culture adequate volume   Culture  Setup Time   Final    GRAM POSITIVE COCCI IN BOTH AEROBIC AND ANAEROBIC BOTTLES CRITICAL RESULT CALLED TO, READ BACK BY AND VERIFIED WITH: [name] PHARMD @1600  12/05/20 EB Performed at [facility], [address], [place], [place]  Culture STAPHYLOCOCCUS AUREUS (A)  Final   Report Status 12/08/2020 FINAL  Final   Organism ID, Bacteria STAPHYLOCOCCUS AUREUS  Final      Susceptibility   Staphylococcus aureus - MIC*    CIPROFLOXACIN <=0.5 SENSITIVE Sensitive     ERYTHROMYCIN >=8 RESISTANT Resistant     GENTAMICIN <=0.5 SENSITIVE Sensitive     OXACILLIN <=0.25 SENSITIVE Sensitive     TETRACYCLINE <=1 SENSITIVE Sensitive     VANCOMYCIN 1 SENSITIVE Sensitive     TRIMETH/SULFA <=10 SENSITIVE Sensitive     CLINDAMYCIN RESISTANT Resistant     RIFAMPIN <=0.5 SENSITIVE Sensitive     Inducible Clindamycin POSITIVE Resistant     * STAPHYLOCOCCUS AUREUS  Blood Culture ID Panel (Reflexed)     Status: Abnormal   Collection Time: 12/04/20  8:59 PM  Result Value Ref Range Status   Enterococcus faecalis NOT DETECTED NOT DETECTED Final   Enterococcus Faecium NOT DETECTED NOT DETECTED Final   Listeria monocytogenes NOT DETECTED NOT DETECTED Final   Staphylococcus species DETECTED (A) NOT DETECTED Final    Comment: CRITICAL RESULT CALLED TO, READ BACK BY AND VERIFIED WITH: ANDREW MEYER PHARMD  12/05/20 EB    Staphylococcus aureus (BCID) DETECTED (A) NOT DETECTED Final    Comment: CRITICAL RESULT CALLED TO, READ BACK BY AND VERIFIED WITH: [name] PHARMD  12/05/20 EB    Staphylococcus epidermidis NOT DETECTED NOT DETECTED Final   Staphylococcus lugdunensis NOT DETECTED NOT DETECTED Final   Streptococcus species NOT DETECTED NOT DETECTED Final   Streptococcus agalactiae NOT DETECTED NOT DETECTED Final   Streptococcus pneumoniae NOT DETECTED NOT DETECTED Final   Streptococcus pyogenes NOT DETECTED NOT DETECTED Final    A.calcoaceticus-baumannii NOT DETECTED NOT DETECTED Final   Bacteroides fragilis NOT DETECTED NOT DETECTED Final   Enterobacterales NOT DETECTED NOT DETECTED Final   Enterobacter cloacae complex NOT DETECTED NOT DETECTED Final   Escherichia coli NOT DETECTED NOT DETECTED Final   Klebsiella aerogenes NOT DETECTED NOT DETECTED Final   Klebsiella oxytoca NOT DETECTED NOT DETECTED Final   Klebsiella pneumoniae NOT DETECTED NOT DETECTED Final   Proteus species NOT DETECTED NOT DETECTED Final   Salmonella species NOT DETECTED NOT DETECTED Final   Serratia marcescens NOT DETECTED NOT DETECTED Final   Haemophilus influenzae NOT DETECTED NOT DETECTED Final   Neisseria meningitidis NOT DETECTED NOT DETECTED Final   Pseudomonas aeruginosa NOT DETECTED NOT DETECTED Final   Stenotrophomonas maltophilia NOT DETECTED NOT DETECTED Final   Candida albicans NOT DETECTED NOT DETECTED Final   Candida auris NOT DETECTED NOT DETECTED Final   Candida glabrata NOT DETECTED NOT DETECTED Final   Candida krusei NOT DETECTED NOT DETECTED Final   Candida parapsilosis NOT DETECTED NOT DETECTED Final   Candida tropicalis NOT DETECTED NOT DETECTED Final   Cryptococcus neoformans/gattii NOT DETECTED NOT DETECTED Final   Meth resistant mecA/C and MREJ NOT DETECTED NOT DETECTED Final    Comment: Performed at [facility], [address] [place], [place] [address]  Culture, blood (routine x 2)     Status: None (Preliminary result)   Collection Time: 12/06/20  5:45 AM   Specimen: BLOOD RIGHT HAND  Result Value Ref Range Status   Specimen Description BLOOD RIGHT HAND  Final   Special Requests   Final    BOTTLES DRAWN AEROBIC AND ANAEROBIC Blood Culture adequate volume   Culture   Final    NO GROWTH 2 DAYS Performed at [facility], [address]  [place], [place] [id number]    Report Status PENDING  Incomplete  Culture, blood (routine x 2)     Status: None (Preliminary result)   Collection Time: 12/06/20   5:53 AM   Specimen: BLOOD LEFT HAND  Result Value Ref Range Status   Specimen Description BLOOD LEFT HAND  Final   Special Requests   Final    BOTTLES DRAWN AEROBIC AND ANAEROBIC Blood Culture adequate volume   Culture   Final    NO GROWTH 2 DAYS Performed at [facility], [address] [place], [place] [id number]    Report Status PENDING  Incomplete     Radiology Studies: No results found.  Pamella Pertostin [place], MD, PhD [facility]  Between 7 am - 7 pm I am available, please contact me via Amion (for emergencies) or Securechat (non urgent messages)  Between 7 pm - 7 am I am not available, please contact night coverage MD/APP via Amion

## 2020-12-08 NOTE — TOC Initial Note (Addendum)
Transition of Care [facility]) - Initial/Assessment Note    Patient Details  Name: Hendryx Ricke MRN: [id number] Date of Birth: 22-Jul-1975  Transition of Care [facility]) CM/SW Contact:    Kingsley Plan, RN Phone Number: 12/08/2020, 11:17 AM  Clinical Narrative:                  Patient from home with girlfriend Lakaesa 336 [phone].  Patient's cell [phone]  Address: [address] , [place] [address]   PT recommended HHPT and walker.   NCM unable to find home health agency that covers patient's address to accept referral due to staffing. Patient aware and states once he completes IV ABX he will be able to "move around better". Patient states his girlfriend will be able to assit also.   Patient will need walker . Will order closer to discharge.   Patient does not have a PCP, he is interested in [facility]. NCM called and scheduled appointment for January 20, 2021 at 0830 am. Placed on AVS   Patent examiner.  At discharge will assist with scripts with [facility] and [facility]  Expected Discharge Plan: Home/Self Care Barriers to Discharge: Continued Medical Work up   Patient Goals and CMS Choice Patient states their goals for this hospitalization and ongoing recovery are:: to return to home CMS Medicare.gov Compare Post Acute Care list provided to:: Patient Choice offered to / list presented to : Patient  Expected Discharge Plan and Services Expected Discharge Plan: Home/Self Care In-house Referral: Financial Counselor Discharge Planning Services: CM Consult,Indigent Health [facility] Program,Medication Assistance Post Acute Care Choice: Durable Medical Equipment Living arrangements for the past 2 months: Single Family Home                 DME Arranged: Walker rolling DME Agency: [employer]                  Prior Living Arrangements/Services Living arrangements for the past 2 months: Single Family Home Lives with:: Significant  Other Patient language and need for interpreter reviewed:: Yes Do you feel safe going back to the place where you live?: Yes      Need for Family Participation in Patient Care: Yes (Comment) Care giver support system in place?: Yes (comment)   Criminal Activity/Legal Involvement Pertinent to Current Situation/Hospitalization: No - Comment as needed  Activities of Daily Living Home Assistive Devices/Equipment: Wheelchair ADL Screening (condition at time of admission) Patient's cognitive ability adequate to safely complete daily activities?: Yes Is the patient deaf or have difficulty hearing?: No Does the patient have difficulty seeing, even when wearing glasses/contacts?: No Does the patient have difficulty concentrating, remembering, or making decisions?: No Patient able to express need for assistance with ADLs?: Yes Does the patient have difficulty dressing or bathing?: Yes Independently performs ADLs?: No Does the patient have difficulty walking or climbing stairs?: Yes Weakness of Legs: Both Weakness of Arms/Hands: None  Permission Sought/Granted   Permission granted to share information with : No              Emotional Assessment Appearance:: Appears stated age Attitude/Demeanor/Rapport: Engaged Affect (typically observed): Accepting Orientation: : Oriented to Self,Oriented to Place,Oriented to  Time,Oriented to Situation Alcohol / Substance Use: Not Applicable Psych Involvement: No (comment)  Admission diagnosis:  Discitis of lumbar region [M46.46] Back pain [M54.9] Left lower quadrant abdominal pain [R10.32] Patient Active Problem List   Diagnosis Date Noted  . Discitis of lumbar region 12/04/2020  .  Psoas abscess, left (HCC) 12/04/2020  . Type 2 diabetes mellitus with complication, without long-term current use of insulin (HCC) 12/04/2020  . Substance abuse (HCC) 12/04/2020  . Pseudohyponatremia 12/04/2020   PCP:  Patient, No Pcp Per (Inactive) Pharmacy:    [facility] [address] [place] - [address] [place] [address] Phone: [phone] Fax: [phone]  [facility] [address] [place] [address] Phone: [phone] Fax: [phone]     Social Determinants of Health (SDOH) Interventions    Readmission Risk Interventions No flowsheet data found.

## 2020-12-09 LAB — BASIC METABOLIC PANEL
Anion gap: 11 (ref 5–15)
BUN: 11 mg/dL (ref 6–20)
CO2: 27 mmol/L (ref 22–32)
Calcium: 9.1 mg/dL (ref 8.9–10.3)
Chloride: 98 mmol/L (ref 98–111)
Creatinine, Ser: 0.93 mg/dL (ref 0.61–1.24)
GFR, Estimated: >60 mL/min (ref >60)
Glucose, Bld: 286 mg/dL — ABNORMAL HIGH (ref 70–99)
Potassium: 3.6 mmol/L (ref 3.5–5.1)
Sodium: 136 mmol/L (ref 135–145)

## 2020-12-09 LAB — CBC
HCT: 41 % (ref 39.0–52.0)
Hemoglobin: 13.2 g/dL (ref 13.0–17.0)
MCH: 26.2 pg (ref 26.0–34.0)
MCHC: 32.2 g/dL (ref 30.0–36.0)
MCV: 81.3 fL (ref 80.0–100.0)
Platelets: 317 10*3/uL (ref 150–400)
RBC: 5.04 MIL/uL (ref 4.22–5.81)
RDW: 12.2 % (ref 11.5–15.5)
WBC: 5.3 10*3/uL (ref 4.0–10.5)
nRBC: 0 % (ref 0.0–0.2)

## 2020-12-09 LAB — GLUCOSE, CAPILLARY
Glucose-Capillary: 301 mg/dL — ABNORMAL HIGH (ref 70–99)
Glucose-Capillary: 262 mg/dL — ABNORMAL HIGH (ref 70–99)
Glucose-Capillary: 226 mg/dL — ABNORMAL HIGH (ref 70–99)
Glucose-Capillary: 298 mg/dL — ABNORMAL HIGH (ref 70–99)

## 2020-12-09 MED ORDER — INSULIN ASPART PROT & ASPART (70-30 MIX) 100 UNIT/ML ~~LOC~~ SUSP
14.0000 [IU] | Freq: Two times a day (BID) | SUBCUTANEOUS | Status: DC
  Administered 2020-12-09 – 2020-12-13 (×8): 14 [IU] via SUBCUTANEOUS
  Filled 2020-12-09: qty 10

## 2020-12-09 MED ORDER — PREGABALIN 25 MG PO CAPS
75.0000 mg | ORAL_CAPSULE | Freq: Three times a day (TID) | ORAL | Status: DC
  Administered 2020-12-09 – 2020-12-10 (×4): 75 mg via ORAL
  Filled 2020-12-09 (×4): qty 3

## 2020-12-09 NOTE — Progress Notes (Signed)
Inpatient Diabetes Program Recommendations  AACE/ADA: New Consensus Statement on Inpatient Glycemic Control   Target Ranges:  Prepandial:   less than 140 mg/dL      Peak postprandial:   less than 180 mg/dL (1-2 hours)      Critically ill patients:  140 - 180 mg/dL   Results for LAMERE, LIGHTNER (MRN [id number]) as of 12/09/2020 10:06  Ref. Range 12/08/2020 06:08 12/08/2020 11:01 12/08/2020 15:59 12/08/2020 21:30 12/09/2020 06:11  Glucose-Capillary Latest Ref Range: 70 - 99 mg/dL 563 (H) 149 (H) 702 (H) 220 (H) 301 (H)   Review of Glycemic Control  Diabetes history:DM2 Outpatient Diabetes medications:Metformin 500 mg BID (not taking) Current orders for Inpatient glycemic control: 70/30 10 units BID, Novolog 0-15 units TID with meals, Novolog 0-5 units QHS  Inpatient Diabetes Program Recommendations:  Insulin: Please consider increasing 70/30 to 14 units BID (dose will provide a total of 19.6 units for basal and 8.4 units for meal coverage per day).  HbgA1C: A1C 14.3% on 5/22/22indicating an average glucose of 364 mg/dl over the past 2-3 months. Patient will need to be discharged on insulin but is uninsured so will need affordable insulin such as Novolin 70/30.  Thanks, Orlando Penner, RN, MSN, CDE Diabetes Coordinator Inpatient Diabetes Program [phone] (Team Pager from 8am to 5pm)

## 2020-12-09 NOTE — Progress Notes (Signed)
[facility] PROGRESS NOTE  Jeffrey Webb VQQ:[id number] DOB: 08-02-1975 DOA: 12/04/2020 PCP: Patient, No Pcp Per (Inactive)  Status: Remains inpatient appropriate because:Hemodynamically unstable, Unsafe d/c plan, IV treatments appropriate due to intensity of illness or inability to take PO and Inpatient level of care appropriate due to severity of illness   Dispo: The patient is from: Home              Anticipated d/c is to: Home              Patient currently is not medically stable to d/c.   Difficult to place patient No   Level of care: Med-Surg  Code Status: Full Family Communication:  DVT prophylaxis: Lovenox COVID vaccination status: Unknown    HPI: 45 year old male with DM2, substance abuse with cocaine but no IVDU per patient, tobacco use who comes into the hospital with worsening back pain over several weeks.  He denies any trauma, injury or falls.  An MRI in the ED showed findings concerning for L2 discitis/osteomyelitis and questionable psoas abscess.  Neurosurgery, ID consulted, underwent IR guided biopsy  Subjective: Alert.  No specific complaints other than ongoing back pain.  Objective: Vitals:   12/09/20 0058 12/09/20 0449  BP: 126/86 (!) 129/95  Pulse: 87 92  Resp: 18 18  Temp: 98.9 F (37.2 C) 98.7 F (37.1 C)  SpO2: 98% 98%    Intake/Output Summary (Last 24 hours) at 12/09/2020 0836 Last data filed at 12/09/2020 0452 Gross per 24 hour  Intake --  Output 100 ml  Net -100 ml   Filed Weights   12/04/20 0800  Weight: 88.5 kg    Exam:  Constitutional: NAD, calm, uncomfortable.  Back pain Respiratory: clear to auscultation bilaterally, no wheezing, no crackles. Normal respiratory effort. No accessory muscle use.  Room air Cardiovascular: Regular rate and rhythm, no murmurs / rubs / gallops. No extremity edema. 2+ pedal pulses.  Abdomen: no tenderness, no masses palpated. Bowel sounds positive. LBM 5/16 Musculoskeletal: No joint deformity  upper and lower extremities. Good ROM, no contractures. Normal muscle tone.  Currently tender lower lumbar spine Neurologic: CN 2-12 grossly intact. Sensation intact,  Strength 5/5 x all 4 extremities.  Psychiatric: Normal judgment and insight. Alert and oriented x 3. Normal mood.    Assessment/Plan: Acute problems: MSSA bacteremia with acute discitis/osteomyelitis  -ID recommends 6 weeks of IV antibiotics from date of first negative blood cultures; of note blood cultures on 5/21 remained positive.  Follow-up blood cultures pending obtained 5/23 -Will also need need repeat MRI lumbar spine completion of IV antibiotics to make sure left psoas muscle phlegmon has resolved -Following ESR/CRP weekly -Currently on cefazolin will likely need several weeks of antibiotics.  -He has a history of drug use so not a good candidate for PICC line although he adamantly denied to this writer using IV drugs because "I am scared of needles".   -IR was consulted for biopsy vs abscess drainage but IR indicated that there is no fluid collection to drain.  There was concern about left psoas/deep tissue abscess but this is less likely. -TTE showed no valvular involvement, no need for TEE since treatment guidelines for discitis will cover time required for presumed endocarditis -Continue oxycodone for expected back pain.  We will add low-dose Lyrica as adjunct.;  Renal function is normal so he could certainly add scheduled NSAID as well.  Type 2 diabetes mellitus, uncontrolled, with hyperglycemia -A1c 14.3.   -Continue sliding scale, appreciate diabetes coordinator.   -  Increase 70/30 to 14 units   Illicit substance use -UDS positive for cocaine, patient denies any intravenous drug use as he "hates needles"  Chronic diastolic CHF -2D echo showed grade 1 diastolic dysfunction, EF 99-77%.  Appears euvolemic  Tobacco use -continue nicotine patch  Back pain due to #1, have discontinued IV morphine and  increased oral oxygen    Data Reviewed: Basic Metabolic Panel: Recent Labs  Lab 12/05/20 0353 12/06/20 0553 12/07/20 0244 12/08/20 0908 12/09/20 0719  NA 135 133* 133* 133* 136  K 4.0 4.0 3.9 4.0 3.6  CL 101 96* 99 98 98  CO2 _0 GLUCOSE 285* 294* 285* 279* 286*  BUN _1 CREATININE 0.91 0.97 0.91 0.92 0.93  CALCIUM 8.8* 9.3 9.5 9.2 9.1  MG 2.0  --   --   --   --   PHOS 4.2  --   --   --   --    Liver Function Tests: Recent Labs  Lab 12/04/20 0915  AST 14*  ALT 20  ALKPHOS 104  BILITOT 0.3  PROT 8.3*  ALBUMIN 3.0*   Recent Labs  Lab 12/04/20 0915  LIPASE 24     CBG: Recent Labs  Lab 12/08/20 0608 12/08/20 1101 12/08/20 1559 12/08/20 2130 12/09/20 0611  GLUCAP 297* 281* 176* 220* 301*      Active Problems:   Discitis of lumbar region   Psoas abscess, left ([place])   Type 2 diabetes mellitus with complication, without long-term current use of insulin ([place])   Substance abuse ([place])   Pseudohyponatremia   Consultants:  ID  Procedures:  2D echocardiogram  Antibiotics:  Cefazolin   Time spent: 35 minutes    Erin Hearing ANP  [facility] 7 am - 330 pm/M-F for direct patient care and secure chat Please refer to Amion for contact info 5  days

## 2020-12-09 NOTE — Progress Notes (Addendum)
[facility] Infectious Diseases Follow Up Note  Patient Identification: Patient Name: Jeffrey Webb MRN: [id number] [place] Date: 12/04/2020  7:35 AM Age: 45 y.o.Today's Date: 12/09/2020   Reason for Visit: Discitis and osteomyelitis  Active Problems:   Discitis of lumbar region   Psoas abscess, left ([place])   Type 2 diabetes mellitus with complication, without long-term current use of insulin (HCC)   Substance abuse (HCC)   Pseudohyponatremia  Antibiotics: Cefazolin 5/22-current  Lines/Tubes: PIV's  Interval Events: Afebrile, no leukocytosis, hemodynamically stable.  Continues to have lower back pain   Assessment MSSA bacteremia: TTE with no vegetations. Repeat blood cultures 5/23 no growth in 4 days Discitis and osteomyelitis L2-L3-no intervention from neurosurgery and IR Left psoas muscle myositis with phlegmon IVDU HCV antibody nonreactive   Recommendations Cefazolin as is Will need at least 6 weeks of IV antibiotics from date of negative blood cultures on 5/23 assuming it continues to remain negative. End date 01/17/21 Repeat MRI lumbar around end date of completion of IV antibiotics ( around 7/1 to 7/2) to make sure left psoas muscle phlegmon has resolved/or sooner if any changes in clinical status Monitor CBC and BMP; Weekly ESR and CRP He clearly expressed to me he would like to complete his 6 weeks of IV antibiotics. In case he changes his mind and decides to leave, would do a dose of Oritavancin to be followed by PO Linezolid for the remaining duration of 6 weeks. His CBC will need to be monitored in that case and will need a follow up with ID. Let me know.  If he is still in the hospital till his end date, call us back after MRI L spine is done.  I will sign off for now   Rest of the management as per the primary team. Thank you for the consult. Please page with pertinent questions or  concerns.  ______________________________________________________________________ Subjective patient seen and examined at the bedside.  Continues to have lower back pain.  He was able to sit up in the chair from his bed.  No new numbness weakness or tingling in the lower extremities.  Denies nausea vomiting diarrhea and rashes  Vitals BP (!) 129/95 (BP Location: Right Arm)   Pulse 92   Temp 98.7 F (37.1 C) (Oral)   Resp 18   Ht _0  (1.676 m)   Wt 88.5 kg   SpO2 98%   BMI 31.47 kg/m     Physical Exam Constitutional:   Not in acute distress, appears comfortable    Comments:   Cardiovascular:     Rate and Rhythm: Normal rate and regular rhythm.     Heart sounds: No murmur heard.   Pulmonary:     Effort: Pulmonary effort is normal.     Comments: Not in respiratory distress  Abdominal:     Palpations: Abdomen is soft.     Tenderness: Nontender and nondistended  Musculoskeletal:        General: No swelling or tenderness.  Lower lumbar tenderness  Skin:    Comments: No lesions or rashes  Neurological:     General: No focal deficit present.   Psychiatric:        Mood and Affect: Mood normal.   Pertinent Microbiology Results for orders placed or performed during the hospital encounter of 12/04/20  Resp Panel by RT-PCR (Flu A&B, Covid) Nasopharyngeal Swab     Status: None   Collection Time: 12/04/20  6:42 PM   Specimen: Nasopharyngeal Swab; Nasopharyngeal(NP) swabs in  vial transport medium  Result Value Ref Range Status   SARS Coronavirus 2 by RT PCR NEGATIVE NEGATIVE Final    Comment: (NOTE) SARS-CoV-2 target nucleic acids are NOT DETECTED.  The SARS-CoV-2 RNA is generally detectable in upper respiratory specimens during the acute phase of infection. The lowest concentration of SARS-CoV-2 viral copies this assay can detect is 138 copies/mL. A negative result does not preclude SARS-Cov-2 infection and should not be used as the sole basis for treatment or other  patient management decisions. A negative result may occur with  improper specimen collection/handling, submission of specimen other than nasopharyngeal swab, presence of viral mutation(s) within the areas targeted by this assay, and inadequate number of viral copies(<138 copies/mL). A negative result must be combined with clinical observations, patient history, and epidemiological information. The expected result is Negative.  Fact Sheet for Patients:  EntrepreneurPulse.com.au  Fact Sheet for Healthcare Providers:  IncredibleEmployment.be  This test is no t yet approved or cleared by the [place] FDA and  has been authorized for detection and/or diagnosis of SARS-CoV-2 by FDA under an Emergency Use Authorization (EUA). This EUA will remain  in effect (meaning this test can be used) for the duration of the COVID-19 declaration under Section 564(b)(1) of the Act, 21 U.S.C.section 360bbb-3(b)(1), unless the authorization is terminated  or revoked sooner.       Influenza A by PCR NEGATIVE NEGATIVE Final   Influenza B by PCR NEGATIVE NEGATIVE Final    Comment: (NOTE) The Xpert Xpress SARS-CoV-2/FLU/RSV plus assay is intended as an aid in the diagnosis of influenza from Nasopharyngeal swab specimens and should not be used as a sole basis for treatment. Nasal washings and aspirates are unacceptable for Xpert Xpress SARS-CoV-2/FLU/RSV testing.  Fact Sheet for Patients: EntrepreneurPulse.com.au  Fact Sheet for Healthcare Providers: IncredibleEmployment.be  This test is not yet approved or cleared by the [place] FDA and has been authorized for detection and/or diagnosis of SARS-CoV-2 by FDA under an Emergency Use Authorization (EUA). This EUA will remain in effect (meaning this test can be used) for the duration of the COVID-19 declaration under Section 564(b)(1) of the Act, 21 U.S.C. section  360bbb-3(b)(1), unless the authorization is terminated or revoked.  Performed at [facility], [place] [address], [place], [place] [address]   Blood culture (routine x 2)     Status: Abnormal   Collection Time: 12/04/20  8:46 PM   Specimen: BLOOD LEFT HAND  Result Value Ref Range Status   Specimen Description BLOOD LEFT HAND  Final   Special Requests   Final    BOTTLES DRAWN AEROBIC AND ANAEROBIC Blood Culture adequate volume   Culture  Setup Time   Final    GRAM POSITIVE COCCI IN BOTH AEROBIC AND ANAEROBIC BOTTLES CRITICAL VALUE NOTED.  VALUE IS CONSISTENT WITH PREVIOUSLY REPORTED AND CALLED VALUE.    Culture (A)  Final    STAPHYLOCOCCUS AUREUS SUSCEPTIBILITIES PERFORMED ON PREVIOUS CULTURE WITHIN THE LAST 5 DAYS. Performed at [facility], [place] [address], [place], [place] [address]    Report Status 12/08/2020 FINAL  Final  Blood culture (routine x 2)     Status: Abnormal   Collection Time: 12/04/20  8:59 PM   Specimen: BLOOD  Result Value Ref Range Status   Specimen Description BLOOD LEFT ANTECUBITAL  Final   Special Requests   Final    BOTTLES DRAWN AEROBIC AND ANAEROBIC Blood Culture adequate volume   Culture  Setup Time   Final  GRAM POSITIVE COCCI IN BOTH AEROBIC AND ANAEROBIC BOTTLES CRITICAL RESULT CALLED TO, READ BACK BY AND VERIFIED WITH: Hildred Laser PHARMD _0  12/05/20 EB Performed at [facility], [place] [address] [place], [place] [address]    Culture STAPHYLOCOCCUS AUREUS (A)  Final   Report Status 12/08/2020 FINAL  Final   Organism ID, Bacteria STAPHYLOCOCCUS AUREUS  Final      Susceptibility   Staphylococcus aureus - MIC*    CIPROFLOXACIN <=0.5 SENSITIVE Sensitive     ERYTHROMYCIN >=8 RESISTANT Resistant     GENTAMICIN <=0.5 SENSITIVE Sensitive     OXACILLIN <=0.25 SENSITIVE Sensitive     TETRACYCLINE <=1 SENSITIVE Sensitive     VANCOMYCIN 1 SENSITIVE Sensitive     TRIMETH/SULFA <=10 SENSITIVE Sensitive     CLINDAMYCIN RESISTANT  Resistant     RIFAMPIN <=0.5 SENSITIVE Sensitive     Inducible Clindamycin POSITIVE Resistant     * STAPHYLOCOCCUS AUREUS  Blood Culture ID Panel (Reflexed)     Status: Abnormal   Collection Time: 12/04/20  8:59 PM  Result Value Ref Range Status   Enterococcus faecalis NOT DETECTED NOT DETECTED Final   Enterococcus Faecium NOT DETECTED NOT DETECTED Final   Listeria monocytogenes NOT DETECTED NOT DETECTED Final   Staphylococcus species DETECTED (A) NOT DETECTED Final    Comment: CRITICAL RESULT CALLED TO, READ BACK BY AND VERIFIED WITH: ANDREW MEYER PHARMD _1  12/05/20 EB    Staphylococcus aureus (BCID) DETECTED (A) NOT DETECTED Final    Comment: CRITICAL RESULT CALLED TO, READ BACK BY AND VERIFIED WITH: ANDREW MEYER PHARMD _2  12/05/20 EB    Staphylococcus epidermidis NOT DETECTED NOT DETECTED Final   Staphylococcus lugdunensis NOT DETECTED NOT DETECTED Final   Streptococcus species NOT DETECTED NOT DETECTED Final   Streptococcus agalactiae NOT DETECTED NOT DETECTED Final   Streptococcus pneumoniae NOT DETECTED NOT DETECTED Final   Streptococcus pyogenes NOT DETECTED NOT DETECTED Final   A.calcoaceticus-baumannii NOT DETECTED NOT DETECTED Final   Bacteroides fragilis NOT DETECTED NOT DETECTED Final   Enterobacterales NOT DETECTED NOT DETECTED Final   Enterobacter cloacae complex NOT DETECTED NOT DETECTED Final   Escherichia coli NOT DETECTED NOT DETECTED Final   Klebsiella aerogenes NOT DETECTED NOT DETECTED Final   Klebsiella oxytoca NOT DETECTED NOT DETECTED Final   Klebsiella pneumoniae NOT DETECTED NOT DETECTED Final   Proteus species NOT DETECTED NOT DETECTED Final   Salmonella species NOT DETECTED NOT DETECTED Final   Serratia marcescens NOT DETECTED NOT DETECTED Final   Haemophilus influenzae NOT DETECTED NOT DETECTED Final   Neisseria meningitidis NOT DETECTED NOT DETECTED Final   Pseudomonas aeruginosa NOT DETECTED NOT DETECTED Final   Stenotrophomonas maltophilia NOT  DETECTED NOT DETECTED Final   Candida albicans NOT DETECTED NOT DETECTED Final   Candida auris NOT DETECTED NOT DETECTED Final   Candida glabrata NOT DETECTED NOT DETECTED Final   Candida krusei NOT DETECTED NOT DETECTED Final   Candida parapsilosis NOT DETECTED NOT DETECTED Final   Candida tropicalis NOT DETECTED NOT DETECTED Final   Cryptococcus neoformans/gattii NOT DETECTED NOT DETECTED Final   Meth resistant mecA/C and MREJ NOT DETECTED NOT DETECTED Final    Comment: Performed at [facility], [place]. [address] [place], [place] [address]  Culture, blood (routine x 2)     Status: None (Preliminary result)   Collection Time: 12/06/20  5:45 AM   Specimen: BLOOD RIGHT HAND  Result Value Ref Range Status   Specimen Description BLOOD RIGHT HAND  Final  Special Requests   Final    BOTTLES DRAWN AEROBIC AND ANAEROBIC Blood Culture adequate volume   Culture   Final    NO GROWTH 3 DAYS Performed at [facility], [place] [address] [place], [place] [address]    Report Status PENDING  Incomplete  Culture, blood (routine x 2)     Status: None (Preliminary result)   Collection Time: 12/06/20  5:53 AM   Specimen: BLOOD LEFT HAND  Result Value Ref Range Status   Specimen Description BLOOD LEFT HAND  Final   Special Requests   Final    BOTTLES DRAWN AEROBIC AND ANAEROBIC Blood Culture adequate volume   Culture   Final    NO GROWTH 3 DAYS Performed at [facility], [place] [address] [place], [place] [address]    Report Status PENDING  Incomplete    Pertinent Lab. CBC Latest Ref Rng & Units 12/09/2020 12/08/2020 12/07/2020  WBC 4.0 - 10.5 K/uL 5.3 7.6 7.9  Hemoglobin 13.0 - 17.0 g/dL 13.2 13.5 13.3  Hematocrit 39.0 - 52.0 % 41.0 41.5 40.5  Platelets 150 - 400 K/uL 317 325 364   CMP Latest Ref Rng & Units 12/09/2020 12/08/2020 12/07/2020  Glucose 70 - 99 mg/dL 286(H) 279(H) 285(H)  BUN 6 - 20 mg/dL _0 Creatinine 0.61 - 1.24 mg/dL 0.93 0.92 0.91  Sodium 135 - 145 mmol/L  136 133(L) 133(L)  Potassium 3.5 - 5.1 mmol/L 3.6 4.0 3.9  Chloride 98 - 111 mmol/L 98 98 99  CO2 22 - 32 mmol/L _1 Calcium 8.9 - 10.3 mg/dL 9.1 9.2 9.5  Total Protein 6.5 - 8.1 g/dL - - -  Total Bilirubin 0.3 - 1.2 mg/dL - - -  Alkaline Phos 38 - 126 U/L - - -  AST 15 - 41 U/L - - -  ALT 0 - 44 U/L - - -     Pertinent Imaging today Plain films and CT images have been personally visualized and interpreted; radiology reports have been reviewed. Decision making incorporated into the Impression / Recommendations.  I have spent more than 35 minutes for this patient encounter including review of prior medical records, coordination of care  with greater than 50% of time being face to face/counseling and discussing diagnostics/treatment plan with the patient/family.  Electronically signed by:   Rosiland Oz, MD Infectious Disease Physician [facility] for Infectious Disease Pager: [phone]

## 2020-12-09 NOTE — Progress Notes (Signed)
Physical Therapy Treatment Patient Details Name: Jeffrey Webb MRN: [id number] DOB: 1975-10-23 Today's Date: 12/09/2020    History of Present Illness 45 y.o. male, Presented to the ER on 12/04/2020 with back pain for over 3 weeks. MRI and CT of abdomen pelvis and spine demonstrating findings concerning with L2 discitis and/or osteomyelitis and questionable psoas abscess. PMH of type 2 diabetes, substance abuse, tobacco abuse    PT Comments    Pt sitting up in recliner on entry looking much more alert today. Pt reports he thinks that the Lyrica is making a big difference in his pain level. Pt min guard for transfers and contact guard A for ambulating without AD 150 feet in hallway. Pt and his mother are both happy with his progress. D/c plans remain appropriate at this time. PT will continue to follow acutely.    Follow Up Recommendations  Home health PT;Supervision for mobility/OOB     Equipment Recommendations  Rolling walker with 5" wheels    Recommendations for Other Services OT consult     Precautions / Restrictions Precautions Precautions: Fall Restrictions Weight Bearing Restrictions: No    Mobility  Bed Mobility               General bed mobility comments: sitting up in recliner on entry    Transfers Overall transfer level: Needs assistance Equipment used: None Transfers: Sit to/from Stand Sit to Stand: Min guard         General transfer comment: min guard for safety  Ambulation/Gait Ambulation/Gait assistance: Min assist Gait Distance (Feet): 150 Feet Assistive device: None Gait Pattern/deviations: Antalgic;Decreased stance time - left;Decreased weight shift to left;Step-through pattern Gait velocity: slow Gait velocity interpretation: <1.8 ft/sec, indicate of risk for recurrent falls General Gait Details: contact guard assist for slow, antalgic gait, without AD. continues to have decreased weight shift to L       Balance Overall balance  assessment: Needs assistance Sitting-balance support: Feet supported Sitting balance-[name]: Fair     Standing balance support: Bilateral upper extremity supported Standing balance-[name]: Fair                              Cognition Arousal/Alertness: Awake/alert Behavior During Therapy: WFL for tasks assessed/performed Overall Cognitive Status: Within Functional Limits for tasks assessed                                 General Comments: poor safety insight         General Comments General comments (skin integrity, edema, etc.): pt reports he is feeling better today, he thinks that the Lyrica that he is taking has helped alot. VSS on RA, mother present during session      Pertinent Vitals/Pain Pain Assessment: Faces Faces Pain Scale: Hurts little more Pain Location: low back pain, L hip Pain Descriptors / Indicators: Throbbing;Aching Pain Intervention(s): Limited activity within patient's tolerance;Monitored during session;Repositioned           PT Goals (current goals can now be found in the care plan section) Acute Rehab PT Goals Patient Stated Goal: have less pain PT Goal Formulation: With patient Time For Goal Achievement: 12/22/20 Potential to Achieve Goals: Fair Progress towards PT goals: Progressing toward goals    Frequency    Min 3X/week      PT Plan Current plan remains appropriate       AM-PAC  PT "6 Clicks" Mobility   Outcome Measure  Help needed turning from your back to your side while in a flat bed without using bedrails?: None Help needed moving from lying on your back to sitting on the side of a flat bed without using bedrails?: None Help needed moving to and from a bed to a chair (including a wheelchair)?: A Little Help needed standing up from a chair using your arms (e.g., wheelchair or bedside chair)?: A Little Help needed to walk in hospital room?: A Little Help needed climbing 3-5 steps with a  railing? : A Lot 6 Click Score: 19    End of Session Equipment Utilized During Treatment: Gait belt Activity Tolerance: Patient limited by pain Patient left: in chair;with call bell/phone within reach;with family/visitor present Nurse Communication: Mobility status PT Visit Diagnosis: Other abnormalities of gait and mobility (R26.89);Muscle weakness (generalized) (M62.81);Difficulty in walking, not elsewhere classified (R26.2);Pain Pain - Right/Left: Left Pain - part of body: Hip (back)     Time: 3664-4034 PT Time Calculation (min) (ACUTE ONLY): 19 min  Charges:  $Therapeutic Exercise: 8-22 mins                     Jeffrey Webb Jeffrey Webb PT, DPT Acute Rehabilitation Services Pager [phone] Office [phone]    Jeffrey Webb 12/09/2020, 2:20 PM

## 2020-12-10 LAB — GLUCOSE, CAPILLARY
Glucose-Capillary: 315 mg/dL — ABNORMAL HIGH (ref 70–99)
Glucose-Capillary: 110 mg/dL — ABNORMAL HIGH (ref 70–99)
Glucose-Capillary: 250 mg/dL — ABNORMAL HIGH (ref 70–99)
Glucose-Capillary: 179 mg/dL — ABNORMAL HIGH (ref 70–99)

## 2020-12-10 LAB — BASIC METABOLIC PANEL
Anion gap: 9 (ref 5–15)
BUN: 8 mg/dL (ref 6–20)
CO2: 27 mmol/L (ref 22–32)
Calcium: 9.1 mg/dL (ref 8.9–10.3)
Chloride: 100 mmol/L (ref 98–111)
Creatinine, Ser: 0.86 mg/dL (ref 0.61–1.24)
GFR, Estimated: >60 mL/min (ref >60)
Glucose, Bld: 290 mg/dL — ABNORMAL HIGH (ref 70–99)
Potassium: 3.5 mmol/L (ref 3.5–5.1)
Sodium: 136 mmol/L (ref 135–145)

## 2020-12-10 LAB — CBC
HCT: 39.7 % (ref 39.0–52.0)
Hemoglobin: 12.8 g/dL — ABNORMAL LOW (ref 13.0–17.0)
MCH: 26.1 pg (ref 26.0–34.0)
MCHC: 32.2 g/dL (ref 30.0–36.0)
MCV: 81 fL (ref 80.0–100.0)
Platelets: 305 10*3/uL (ref 150–400)
RBC: 4.9 MIL/uL (ref 4.22–5.81)
RDW: 12.1 % (ref 11.5–15.5)
WBC: 5.8 10*3/uL (ref 4.0–10.5)
nRBC: 0 % (ref 0.0–0.2)

## 2020-12-10 MED ORDER — PREGABALIN 100 MG PO CAPS
100.0000 mg | ORAL_CAPSULE | Freq: Three times a day (TID) | ORAL | Status: DC
  Administered 2020-12-10 – 2020-12-23 (×39): 100 mg via ORAL
  Filled 2020-12-10 (×2): qty 1
  Filled 2020-12-10: qty 4
  Filled 2020-12-10 (×18): qty 1
  Filled 2020-12-10: qty 4
  Filled 2020-12-10 (×20): qty 1

## 2020-12-10 MED ORDER — HYDROCERIN EX CREA
TOPICAL_CREAM | Freq: Two times a day (BID) | CUTANEOUS | Status: DC
  Filled 2020-12-10: qty 113

## 2020-12-10 MED ORDER — BACLOFEN 10 MG PO TABS
5.0000 mg | ORAL_TABLET | Freq: Three times a day (TID) | ORAL | Status: DC
  Administered 2020-12-10 – 2020-12-16 (×19): 5 mg via ORAL
  Filled 2020-12-10 (×19): qty 1

## 2020-12-10 MED ORDER — IBUPROFEN 400 MG PO TABS
600.0000 mg | ORAL_TABLET | Freq: Four times a day (QID) | ORAL | Status: DC
  Administered 2020-12-10 – 2020-12-14 (×20): 600 mg via ORAL
  Filled 2020-12-10 (×20): qty 1

## 2020-12-10 MED ORDER — PANTOPRAZOLE SODIUM 40 MG PO TBEC
40.0000 mg | DELAYED_RELEASE_TABLET | Freq: Every day | ORAL | Status: DC
  Administered 2020-12-10 – 2020-12-23 (×14): 40 mg via ORAL
  Filled 2020-12-10 (×15): qty 1

## 2020-12-10 NOTE — Progress Notes (Signed)
[facility] PROGRESS NOTE  Jeffrey Webb AVW:[id number] DOB: 10/31/1975 DOA: 12/04/2020 PCP: Patient, No Pcp Per (Inactive)  Status: Remains inpatient appropriate because:Hemodynamically unstable, Unsafe d/c plan, IV treatments appropriate due to intensity of illness or inability to take PO and Inpatient level of care appropriate due to severity of illness   Dispo: The patient is from: Home              Anticipated d/c is to: Home              Patient currently is not medically stable to d/c.   Difficult to place patient No   Level of care: Med-Surg  Code Status: Full Family Communication:  DVT prophylaxis: Lovenox COVID vaccination status: Unknown    HPI: 45 year old male with DM2, substance abuse with cocaine but no IVDU per patient, tobacco use who comes into the hospital with worsening back pain over several weeks.  He denies any trauma, injury or falls.  An MRI in the ED showed findings concerning for L2 discitis/osteomyelitis and questionable psoas abscess.  Neurosurgery, ID consulted, underwent IR guided biopsy  Subjective: Awake.  Continues to report significant back pain which has slightly improved with the addition of Lyrica.  Also asking for some sort of cream to help with dry skin areas on feet.  Objective: Vitals:   12/10/20 0107 12/10/20 0532  BP: 121/79 125/85  Pulse: 87 83  Resp: 18 18  Temp: 98.9 F (37.2 C) 99.1 F (37.3 C)  SpO2: 99% 99%    Intake/Output Summary (Last 24 hours) at 12/10/2020 8295 Last data filed at 12/10/2020 0535 Gross per 24 hour  Intake --  Output 900 ml  Net -900 ml   Filed Weights   12/04/20 0800  Weight: 88.5 kg    Exam:  Constitutional: Awake, calm, mild distress as evidenced by ongoing difficult to control back pain Respiratory: Lungs are clear, normal respiratory effort, room Cardiovascular: Normal heart sounds, regular pulse, no peripheral edema Abdomen:  LBM 5/26, soft nontender nondistended with normoactive  bowel sounds Musculoskeletal: No joint deformity upper and lower extremities. Good ROM, no contractures. Normal muscle tone.  Currently tender lower lumbar spine Neurologic: CN 2-12 grossly intact. Sensation intact,  Strength 5/5 x all 4 extremities.  Psychiatric: Alert and oriented x3.  Pleasant affect.  Assessment/Plan: Acute problems: MSSA bacteremia with acute discitis/osteomyelitis  -ID recommends 6 weeks of IV antibiotics from date of first negative blood cultures; of note blood cultures on 5/21 remained positive.  Follow-up blood cultures pending obtained 5/23 -Will also need need repeat MRI lumbar spine completion of IV antibiotics to make sure left psoas muscle phlegmon has resolved -Following ESR/CRP weekly -Currently on cefazolin will likely need several weeks of antibiotics.  -He has a history of drug use so not a good candidate for PICC line although he adamantly denied to this writer using IV drugs because "I am scared of needles".   -IR was consulted for biopsy vs abscess drainage but IR indicated that there is no fluid collection to drain.  There was concern about left psoas/deep tissue abscess but this is less likely. -TTE showed no valvular involvement, no need for TEE since treatment guidelines for discitis will cover time required for presumed endocarditis -Continue oxycodone for expected back pain.  Increase Lyrica to grams 3 times daily; Robaxin to low-dose baclofen.  Add scheduled ibuprofen with Protonix  Type 2 diabetes mellitus, uncontrolled, with hyperglycemia -A1c 14.3.   -Continue sliding scale, appreciate diabetes coordinator.   -  Increase 70/30 to 14 units   Illicit substance use -UDS positive for cocaine, patient denies any intravenous drug use as he "hates needles"  Chronic diastolic CHF -2D echo showed grade 1 diastolic dysfunction, EF 37-34%.  Appears euvolemic  Tobacco use -continue nicotine patch  Back pain due to #1, have discontinued IV morphine  and increased oral oxygen    Data Reviewed: Basic Metabolic Panel: Recent Labs  Lab 12/05/20 0353 12/06/20 0553 12/07/20 0244 12/08/20 0908 12/09/20 0719  NA 135 133* 133* 133* 136  K 4.0 4.0 3.9 4.0 3.6  CL 101 96* 99 98 98  CO2 26 26 26 25 27   GLUCOSE 285* 294* 285* 279* 286*  BUN 15 10 10 10 11   CREATININE 0.91 0.97 0.91 0.92 0.93  CALCIUM 8.8* 9.3 9.5 9.2 9.1  MG 2.0  --   --   --   --   PHOS 4.2  --   --   --   --    Liver Function Tests: Recent Labs  Lab 12/04/20 0915  AST 14*  ALT 20  ALKPHOS 104  BILITOT 0.3  PROT 8.3*  ALBUMIN 3.0*   Recent Labs  Lab 12/04/20 0915  LIPASE 24     CBG: Recent Labs  Lab 12/09/20 0611 12/09/20 1104 12/09/20 1606 12/09/20 2132 12/10/20 0604  GLUCAP 301* 262* 226* 298* 315*      Active Problems:   Discitis of lumbar region   Psoas abscess, left ([place])   Type 2 diabetes mellitus with complication, without long-term current use of insulin ([place])   Substance abuse ([place])   Pseudohyponatremia   Consultants:  ID  Procedures:  2D echocardiogram  Antibiotics:  Cefazolin   Time spent: 35 minutes    Erin Hearing ANP  [facility] 7 am - 330 pm/M-F for direct patient care and secure chat Please refer to Amion for contact info 6  days

## 2020-12-11 LAB — GLUCOSE, CAPILLARY
Glucose-Capillary: 318 mg/dL — ABNORMAL HIGH (ref 70–99)
Glucose-Capillary: 227 mg/dL — ABNORMAL HIGH (ref 70–99)
Glucose-Capillary: 307 mg/dL — ABNORMAL HIGH (ref 70–99)
Glucose-Capillary: 151 mg/dL — ABNORMAL HIGH (ref 70–99)

## 2020-12-11 LAB — CULTURE, BLOOD (ROUTINE X 2)
Culture: NO GROWTH
Culture: NO GROWTH
Special Requests: ADEQUATE
Special Requests: ADEQUATE

## 2020-12-11 NOTE — Progress Notes (Signed)
PROGRESS NOTE  Jeffrey Webb GLO:[id number] DOB: [birth year] DOA: 12/04/2020 PCP: Patient, No Pcp Per (Inactive)   LOS: 7 days   Brief Narrative / Interim history: 45 year old male with DM2, substance abuse with cocaine but no IVDU per patient, tobacco use who comes into the hospital with worsening back pain over several weeks.  He denies any trauma, injury or falls.  An MRI in the ED showed findings concerning for L2 discitis/osteomyelitis and questionable psoas abscess.  Neurosurgery, ID consulted, underwent IR guided biopsy  Subjective / 24h Interval events: Sleeping better, overall feeling well  Assessment & Plan: Principal Problem MSSA bacteremia with acute discitis/osteomyelitis -ID consulted, following.  Currently on cefazolin will likely need several weeks of antibiotics.  He has a history of drug use so not a good candidate for PICC line.  IR was consulted for biopsy versus abscess drainage but IR indicated that there is no fluid collection to drain.  There was concern about left psoas/deep tissue abscess but this is less likely. -TTE showed no valvular involvement, no need for TEE since treatment guidelines for discitis will cover time required for presumed endocarditis -Continue cefazolin, appreciate ID follow-up  Active Problems Type 2 diabetes mellitus, uncontrolled, with hyperglycemia-A1c 14.3.  Continue sliding scale, appreciate diabetes coordinator.  Continue 7030, CBGs controlled  CBG (last 3)  Recent Labs    12/10/20 1611 12/10/20 2144 12/11/20 0612  GLUCAP 250* 179* 318*    Illicit substance use-UDS positive for cocaine, patient denies any intravenous drug use as he "hates needles".  Still not a candidate for PICC line  Chronic diastolic CHF-2D echo showed grade 1 diastolic dysfunction, EF 55-60%.  Appears euvolemic  Tobacco use-continue nicotine patch  Back pain-continue regimen as below  Scheduled Meds: . baclofen  5 mg Oral TID  . enoxaparin (LOVENOX)  injection  40 mg Subcutaneous Q24H  . hydrocerin   Topical BID  . ibuprofen  600 mg Oral QID  . insulin aspart  0-15 Units Subcutaneous TID WC  . insulin aspart  0-5 Units Subcutaneous QHS  . insulin aspart protamine- aspart  14 Units Subcutaneous BID WC  . multivitamin with minerals  1 tablet Oral Daily  . nicotine  14 mg Transdermal Daily  . pantoprazole  40 mg Oral Daily  . polyethylene glycol  17 g Oral BID  . pregabalin  100 mg Oral TID   Continuous Infusions: .  ceFAZolin (ANCEF) IV 2 g (12/11/20 0607)   PRN Meds:.acetaminophen **OR** acetaminophen, albuterol, ondansetron **OR** ondansetron (ZOFRAN) IV, oxyCODONE, senna-docusate  Diet Orders (From admission, onward)    Start     Ordered   12/04/20 2249  Diet Carb Modified Fluid consistency: Thin; Room service appropriate? Yes  Diet effective now       Question Answer Comment  Diet-HS Snack? Nothing   Calorie Level Medium 1600-2000   Fluid consistency: Thin   Room service appropriate? Yes      12/04/20 2249          DVT prophylaxis: enoxaparin (LOVENOX) injection 40 mg Start: 12/08/20 1445 SCDs Start: 12/04/20 2249     Code Status: Full Code  Family Communication: No family at bedside  Status is: Inpatient  Remains inpatient appropriate because:Inpatient level of care appropriate due to severity of illness   Dispo: The patient is from: Home              Anticipated d/c is to: Home              Patient  currently is not medically stable to d/c.   Difficult to place patient Yes  Level of care: Med-Surg  Consultants:  ID  Procedures:  2D echo  Microbiology  MSSA bacteremia  Antimicrobials: Cefazolin   Objective: Vitals:   12/10/20 1211 12/10/20 1706 12/10/20 2307 12/11/20 0447  BP: 128/74 (!) 114/95 129/74 129/81  Pulse: 97 75 82 96  Resp: 18 18 19 16   Temp: 97.8 F (36.6 C) 98.2 F (36.8 C) 98.8 F (37.1 C) 98.7 F (37.1 C)  TempSrc: Oral Oral Oral Oral  SpO2: 98% 100% 100% 100%  Weight:       Height:        Intake/Output Summary (Last 24 hours) at 12/11/2020 1033 Last data filed at 12/11/2020 0448 Gross per 24 hour  Intake --  Output 300 ml  Net -300 ml   Filed Weights   12/04/20 0800  Weight: 88.5 kg    Examination:  Constitutional: NAD Eyes: No icterus ENMT: mmm  Neck: normal, supple Respiratory: No wheezing, no crackles Cardiovascular: Regular, no murmurs, no edema Abdomen: Nondistended, bowel sounds positive Musculoskeletal: no clubbing / cyanosis.  Skin: No rashes Neurologic: Nonfocal, equal strength  Data Reviewed: I have independently reviewed following labs and imaging studies  CBC: Recent Labs  Lab 12/06/20 0553 12/07/20 0244 12/08/20 0908 12/09/20 0719 12/10/20 0831  WBC 8.9 7.9 7.6 5.3 5.8  HGB 13.1 13.3 13.5 13.2 12.8*  HCT 39.9 40.5 41.5 41.0 39.7  MCV 80.4 80.8 81.2 81.3 81.0  PLT 418* 364 325 317 305   Basic Metabolic Panel: Recent Labs  Lab 12/05/20 0353 12/06/20 0553 12/07/20 0244 12/08/20 0908 12/09/20 0719 12/10/20 0831  NA 135 133* 133* 133* 136 136  K 4.0 4.0 3.9 4.0 3.6 3.5  CL 101 96* 99 98 98 100  CO2 26 26 26 25 27 27   GLUCOSE 285* 294* 285* 279* 286* 290*  BUN 15 10 10 10 11 8   CREATININE 0.91 0.97 0.91 0.92 0.93 0.86  CALCIUM 8.8* 9.3 9.5 9.2 9.1 9.1  MG 2.0  --   --   --   --   --   PHOS 4.2  --   --   --   --   --    Liver Function Tests: No results for input(s): AST, ALT, ALKPHOS, BILITOT, PROT, ALBUMIN in the last 168 hours. Coagulation Profile: No results for input(s): INR, PROTIME in the last 168 hours. HbA1C: No results for input(s): HGBA1C in the last 72 hours. CBG: Recent Labs  Lab 12/10/20 0604 12/10/20 1125 12/10/20 1611 12/10/20 2144 12/11/20 0612  GLUCAP 315* 110* 250* 179* 318*    Recent Results (from the past 240 hour(s))  Urine C&S     Status: None   Collection Time: 12/04/20  5:06 AM   Specimen: Urine, Clean Catch  Result Value Ref Range Status   Specimen Description  URINE, CLEAN CATCH  Final   Special Requests NONE  Final   Culture   Final    NO GROWTH Performed at [facility], [address] [place], [facility] [address]    Report Status 12/05/2020 FINAL  Final  Resp Panel by RT-PCR (Flu A&B, Covid) Nasopharyngeal Swab     Status: None   Collection Time: 12/04/20  6:42 PM   Specimen: Nasopharyngeal Swab; Nasopharyngeal(NP) swabs in vial transport medium  Result Value Ref Range Status   SARS Coronavirus 2 by RT PCR NEGATIVE NEGATIVE Final    Comment: (NOTE) SARS-CoV-2 target nucleic  acids are NOT DETECTED.  The SARS-CoV-2 RNA is generally detectable in upper respiratory specimens during the acute phase of infection. The lowest concentration of SARS-CoV-2 viral copies this assay can detect is 138 copies/mL. A negative result does not preclude SARS-Cov-2 infection and should not be used as the sole basis for treatment or other patient management decisions. A negative result may occur with  improper specimen collection/handling, submission of specimen other than nasopharyngeal swab, presence of viral mutation(s) within the areas targeted by this assay, and inadequate number of viral copies(<138 copies/mL). A negative result must be combined with clinical observations, patient history, and epidemiological information. The expected result is Negative.  Fact Sheet for Patients:  BloggerCourse.com  Fact Sheet for Healthcare Providers:  SeriousBroker.it  This test is no t yet approved or cleared by the [place] FDA and  has been authorized for detection and/or diagnosis of SARS-CoV-2 by FDA under an Emergency Use Authorization (EUA). This EUA will remain  in effect (meaning this test can be used) for the duration of the COVID-19 declaration under Section 564(b)(1) of the Act, 21 U.S.C.section 360bbb-3(b)(1), unless the authorization is terminated  or revoked sooner.       Influenza A  by PCR NEGATIVE NEGATIVE Final   Influenza B by PCR NEGATIVE NEGATIVE Final    Comment: (NOTE) The Xpert Xpress SARS-CoV-2/FLU/RSV plus assay is intended as an aid in the diagnosis of influenza from Nasopharyngeal swab specimens and should not be used as a sole basis for treatment. Nasal washings and aspirates are unacceptable for Xpert Xpress SARS-CoV-2/FLU/RSV testing.  Fact Sheet for Patients: BloggerCourse.com  Fact Sheet for Healthcare Providers: SeriousBroker.it  This test is not yet approved or cleared by the [place] FDA and has been authorized for detection and/or diagnosis of SARS-CoV-2 by FDA under an Emergency Use Authorization (EUA). This EUA will remain in effect (meaning this test can be used) for the duration of the COVID-19 declaration under Section 564(b)(1) of the Act, 21 U.S.C. section 360bbb-3(b)(1), unless the authorization is terminated or revoked.  Performed at [facility], [address], [place], [place] [address]   Blood culture (routine x 2)     Status: Abnormal   Collection Time: 12/04/20  8:46 PM   Specimen: BLOOD LEFT HAND  Result Value Ref Range Status   Specimen Description BLOOD LEFT HAND  Final   Special Requests   Final    BOTTLES DRAWN AEROBIC AND ANAEROBIC Blood Culture adequate volume   Culture  Setup Time   Final    GRAM POSITIVE COCCI IN BOTH AEROBIC AND ANAEROBIC BOTTLES CRITICAL VALUE NOTED.  VALUE IS CONSISTENT WITH PREVIOUSLY REPORTED AND CALLED VALUE.    Culture (A)  Final    STAPHYLOCOCCUS AUREUS SUSCEPTIBILITIES PERFORMED ON PREVIOUS CULTURE WITHIN THE LAST 5 DAYS. Performed at [facility], [address], [place], [place] [address]    Report Status 12/08/2020 FINAL  Final  Blood culture (routine x 2)     Status: Abnormal   Collection Time: 12/04/20  8:59 PM   Specimen: BLOOD  Result Value Ref Range Status   Specimen Description BLOOD LEFT ANTECUBITAL   Final   Special Requests   Final    BOTTLES DRAWN AEROBIC AND ANAEROBIC Blood Culture adequate volume   Culture  Setup Time   Final    GRAM POSITIVE COCCI IN BOTH AEROBIC AND ANAEROBIC BOTTLES CRITICAL RESULT CALLED TO, READ BACK BY AND VERIFIED WITH: [name] PHARMD @1600  12/05/20 EB  Performed at [facility], [address] [place], [place] [address]    Culture STAPHYLOCOCCUS AUREUS (A)  Final   Report Status 12/08/2020 FINAL  Final   Organism ID, Bacteria STAPHYLOCOCCUS AUREUS  Final      Susceptibility   Staphylococcus aureus - MIC*    CIPROFLOXACIN <=0.5 SENSITIVE Sensitive     ERYTHROMYCIN >=8 RESISTANT Resistant     GENTAMICIN <=0.5 SENSITIVE Sensitive     OXACILLIN <=0.25 SENSITIVE Sensitive     TETRACYCLINE <=1 SENSITIVE Sensitive     VANCOMYCIN 1 SENSITIVE Sensitive     TRIMETH/SULFA <=10 SENSITIVE Sensitive     CLINDAMYCIN RESISTANT Resistant     RIFAMPIN <=0.5 SENSITIVE Sensitive     Inducible Clindamycin POSITIVE Resistant     * STAPHYLOCOCCUS AUREUS  Blood Culture ID Panel (Reflexed)     Status: Abnormal   Collection Time: 12/04/20  8:59 PM  Result Value Ref Range Status   Enterococcus faecalis NOT DETECTED NOT DETECTED Final   Enterococcus Faecium NOT DETECTED NOT DETECTED Final   Listeria monocytogenes NOT DETECTED NOT DETECTED Final   Staphylococcus species DETECTED (A) NOT DETECTED Final    Comment: CRITICAL RESULT CALLED TO, READ BACK BY AND VERIFIED WITH: ANDREW MEYER PHARMD @1600  12/05/20 EB    Staphylococcus aureus (BCID) DETECTED (A) NOT DETECTED Final    Comment: CRITICAL RESULT CALLED TO, READ BACK BY AND VERIFIED WITH: ANDREW MEYER PHARMD @1600  12/05/20 EB    Staphylococcus epidermidis NOT DETECTED NOT DETECTED Final   Staphylococcus lugdunensis NOT DETECTED NOT DETECTED Final   Streptococcus species NOT DETECTED NOT DETECTED Final   Streptococcus agalactiae NOT DETECTED NOT DETECTED Final   Streptococcus pneumoniae NOT DETECTED NOT  DETECTED Final   Streptococcus pyogenes NOT DETECTED NOT DETECTED Final   A.calcoaceticus-baumannii NOT DETECTED NOT DETECTED Final   Bacteroides fragilis NOT DETECTED NOT DETECTED Final   Enterobacterales NOT DETECTED NOT DETECTED Final   Enterobacter cloacae complex NOT DETECTED NOT DETECTED Final   Escherichia coli NOT DETECTED NOT DETECTED Final   Klebsiella aerogenes NOT DETECTED NOT DETECTED Final   Klebsiella oxytoca NOT DETECTED NOT DETECTED Final   Klebsiella pneumoniae NOT DETECTED NOT DETECTED Final   Proteus species NOT DETECTED NOT DETECTED Final   Salmonella species NOT DETECTED NOT DETECTED Final   Serratia marcescens NOT DETECTED NOT DETECTED Final   Haemophilus influenzae NOT DETECTED NOT DETECTED Final   Neisseria meningitidis NOT DETECTED NOT DETECTED Final   Pseudomonas aeruginosa NOT DETECTED NOT DETECTED Final   Stenotrophomonas maltophilia NOT DETECTED NOT DETECTED Final   Candida albicans NOT DETECTED NOT DETECTED Final   Candida auris NOT DETECTED NOT DETECTED Final   Candida glabrata NOT DETECTED NOT DETECTED Final   Candida krusei NOT DETECTED NOT DETECTED Final   Candida parapsilosis NOT DETECTED NOT DETECTED Final   Candida tropicalis NOT DETECTED NOT DETECTED Final   Cryptococcus neoformans/gattii NOT DETECTED NOT DETECTED Final   Meth resistant mecA/C and MREJ NOT DETECTED NOT DETECTED Final    Comment: Performed at [facility], [address] [place], [place] [address]  Culture, blood (routine x 2)     Status: None   Collection Time: 12/06/20  5:45 AM   Specimen: BLOOD RIGHT HAND  Result Value Ref Range Status   Specimen Description BLOOD RIGHT HAND  Final   Special Requests   Final    BOTTLES DRAWN AEROBIC AND ANAEROBIC Blood Culture adequate volume   Culture   Final  NO GROWTH 5 DAYS Performed at [facility], [address], [place], [place] [address]    Report Status 12/11/2020 FINAL  Final  Culture, blood (routine x 2)      Status: None   Collection Time: 12/06/20  5:53 AM   Specimen: BLOOD LEFT HAND  Result Value Ref Range Status   Specimen Description BLOOD LEFT HAND  Final   Special Requests   Final    BOTTLES DRAWN AEROBIC AND ANAEROBIC Blood Culture adequate volume   Culture   Final    NO GROWTH 5 DAYS Performed at [facility], [address], [place], [place] [address]    Report Status 12/11/2020 FINAL  Final     Radiology Studies: No results found.  Pamella Pert, MD, PhD [facility]  Between 7 am - 7 pm I am available, please contact me via Amion (for emergencies) or Securechat (non urgent messages)  Between 7 pm - 7 am I am not available, please contact night coverage MD/APP via Amion

## 2020-12-12 LAB — GLUCOSE, CAPILLARY
Glucose-Capillary: 310 mg/dL — ABNORMAL HIGH (ref 70–99)
Glucose-Capillary: 182 mg/dL — ABNORMAL HIGH (ref 70–99)
Glucose-Capillary: 240 mg/dL — ABNORMAL HIGH (ref 70–99)
Glucose-Capillary: 188 mg/dL — ABNORMAL HIGH (ref 70–99)

## 2020-12-12 NOTE — Progress Notes (Signed)
PROGRESS NOTE  Jeffrey Webb YTK:[id number] DOB: 02/08/1976 DOA: 12/04/2020 PCP: Patient, No Pcp Per (Inactive)   LOS: 8 days   Brief Narrative / Interim history: 45 year old male with DM2, substance abuse with cocaine but no IVDU per patient, tobacco use who comes into the hospital with worsening back pain over several weeks.  He denies any trauma, injury or falls.  An MRI in the ED showed findings concerning for L2 discitis/osteomyelitis and questionable psoas abscess.  Neurosurgery, ID consulted, underwent IR guided biopsy  Subjective / 24h Interval events: No issues today.   Assessment & Plan: Principal Problem MSSA bacteremia with acute discitis/osteomyelitis -ID consulted, following.  Currently on cefazolin will likely need several weeks of antibiotics.  He has a history of drug use so not a good candidate for PICC line.  IR was consulted for biopsy versus abscess drainage but IR indicated that there is no fluid collection to drain.  There was concern about left psoas/deep tissue abscess but this is less likely. -TTE showed no valvular involvement, no need for TEE since treatment guidelines for discitis will cover time required for presumed endocarditis -Continue cefazolin, appreciate ID follow-up  Active Problems Type 2 diabetes mellitus, uncontrolled, with hyperglycemia-A1c 14.3.  Continue sliding scale, appreciate diabetes coordinator.  Continue 7030, CBGs controlled  CBG (last 3)  Recent Labs    12/11/20 1604 12/11/20 2109 12/12/20 0614  GLUCAP 307* 151* 310*    Illicit substance use-UDS positive for cocaine, patient denies any intravenous drug use as he "hates needles".  Still not a candidate for PICC line  Chronic diastolic CHF-2D echo showed grade 1 diastolic dysfunction, EF 55-60%.  Appears euvolemic  Tobacco use-continue nicotine patch  Back pain-continue regimen as below  Scheduled Meds: . baclofen  5 mg Oral TID  . enoxaparin (LOVENOX) injection  40 mg  Subcutaneous Q24H  . hydrocerin   Topical BID  . ibuprofen  600 mg Oral QID  . insulin aspart  0-15 Units Subcutaneous TID WC  . insulin aspart  0-5 Units Subcutaneous QHS  . insulin aspart protamine- aspart  14 Units Subcutaneous BID WC  . multivitamin with minerals  1 tablet Oral Daily  . nicotine  14 mg Transdermal Daily  . pantoprazole  40 mg Oral Daily  . polyethylene glycol  17 g Oral BID  . pregabalin  100 mg Oral TID   Continuous Infusions: .  ceFAZolin (ANCEF) IV 2 g (12/12/20 0617)   PRN Meds:.acetaminophen **OR** acetaminophen, albuterol, ondansetron **OR** ondansetron (ZOFRAN) IV, oxyCODONE, senna-docusate  Diet Orders (From admission, onward)    Start     Ordered   12/04/20 2249  Diet Carb Modified Fluid consistency: Thin; Room service appropriate? Yes  Diet effective now       Question Answer Comment  Diet-HS Snack? Nothing   Calorie Level Medium 1600-2000   Fluid consistency: Thin   Room service appropriate? Yes      12/04/20 2249          DVT prophylaxis: enoxaparin (LOVENOX) injection 40 mg Start: 12/08/20 1445 SCDs Start: 12/04/20 2249     Code Status: Full Code  Family Communication: No family at bedside  Status is: Inpatient  Remains inpatient appropriate because:Inpatient level of care appropriate due to severity of illness   Dispo: The patient is from: Home              Anticipated d/c is to: Home              Patient currently  is not medically stable to d/c.   Difficult to place patient Yes  Level of care: Med-Surg  Consultants:  ID  Procedures:  2D echo  Microbiology  MSSA bacteremia  Antimicrobials: Cefazolin   Objective: Vitals:   12/11/20 1231 12/11/20 1811 12/11/20 2225 12/12/20 0440  BP: 125/85 131/87 130/82 132/84  Pulse: 91 92 91 81  Resp: 16 16 18 18   Temp: 98.6 F (37 C) 98.7 F (37.1 C) 98.3 F (36.8 C) 98 F (36.7 C)  TempSrc: Oral Oral Oral Oral  SpO2: 100% 100% 99% 100%  Weight:      Height:         Intake/Output Summary (Last 24 hours) at 12/12/2020 1020 Last data filed at 12/12/2020 0442 Gross per 24 hour  Intake 720 ml  Output 200 ml  Net 520 ml   Filed Weights   12/04/20 0800  Weight: 88.5 kg    Examination:  Constitutional: nad  Data Reviewed: I have independently reviewed following labs and imaging studies  CBC: Recent Labs  Lab 12/06/20 0553 12/07/20 0244 12/08/20 0908 12/09/20 0719 12/10/20 0831  WBC 8.9 7.9 7.6 5.3 5.8  HGB 13.1 13.3 13.5 13.2 12.8*  HCT 39.9 40.5 41.5 41.0 39.7  MCV 80.4 80.8 81.2 81.3 81.0  PLT 418* 364 325 317 305   Basic Metabolic Panel: Recent Labs  Lab 12/06/20 0553 12/07/20 0244 12/08/20 0908 12/09/20 0719 12/10/20 0831  NA 133* 133* 133* 136 136  K 4.0 3.9 4.0 3.6 3.5  CL 96* 99 98 98 100  CO2 26 26 25 27 27   GLUCOSE 294* 285* 279* 286* 290*  BUN 10 10 10 11 8   CREATININE 0.97 0.91 0.92 0.93 0.86  CALCIUM 9.3 9.5 9.2 9.1 9.1   Liver Function Tests: No results for input(s): AST, ALT, ALKPHOS, BILITOT, PROT, ALBUMIN in the last 168 hours. Coagulation Profile: No results for input(s): INR, PROTIME in the last 168 hours. HbA1C: No results for input(s): HGBA1C in the last 72 hours. CBG: Recent Labs  Lab 12/11/20 0612 12/11/20 1100 12/11/20 1604 12/11/20 2109 12/12/20 0614  GLUCAP 318* 227* 307* 151* 310*    Recent Results (from the past 240 hour(s))  Urine C&S     Status: None   Collection Time: 12/04/20  5:06 AM   Specimen: Urine, Clean Catch  Result Value Ref Range Status   Specimen Description URINE, CLEAN CATCH  Final   Special Requests NONE  Final   Culture   Final    NO GROWTH Performed at [facility], [address] [place], [facility] [address]    Report Status 12/05/2020 FINAL  Final  Resp Panel by RT-PCR (Flu A&B, Covid) Nasopharyngeal Swab     Status: None   Collection Time: 12/04/20  6:42 PM   Specimen: Nasopharyngeal Swab; Nasopharyngeal(NP) swabs in vial transport medium  Result  Value Ref Range Status   SARS Coronavirus 2 by RT PCR NEGATIVE NEGATIVE Final    Comment: (NOTE) SARS-CoV-2 target nucleic acids are NOT DETECTED.  The SARS-CoV-2 RNA is generally detectable in upper respiratory specimens during the acute phase of infection. The lowest concentration of SARS-CoV-2 viral copies this assay can detect is 138 copies/mL. A negative result does not preclude SARS-Cov-2 infection and should not be used as the sole basis for treatment or other patient management decisions. A negative result may occur with  improper specimen collection/handling, submission of specimen other than nasopharyngeal swab, presence of viral mutation(s) within the areas targeted by  this assay, and inadequate number of viral copies(<138 copies/mL). A negative result must be combined with clinical observations, patient history, and epidemiological information. The expected result is Negative.  Fact Sheet for Patients:  BloggerCourse.com  Fact Sheet for Healthcare Providers:  SeriousBroker.it  This test is no t yet approved or cleared by the [place] FDA and  has been authorized for detection and/or diagnosis of SARS-CoV-2 by FDA under an Emergency Use Authorization (EUA). This EUA will remain  in effect (meaning this test can be used) for the duration of the COVID-19 declaration under Section 564(b)(1) of the Act, 21 U.S.C.section 360bbb-3(b)(1), unless the authorization is terminated  or revoked sooner.       Influenza A by PCR NEGATIVE NEGATIVE Final   Influenza B by PCR NEGATIVE NEGATIVE Final    Comment: (NOTE) The Xpert Xpress SARS-CoV-2/FLU/RSV plus assay is intended as an aid in the diagnosis of influenza from Nasopharyngeal swab specimens and should not be used as a sole basis for treatment. Nasal washings and aspirates are unacceptable for Xpert Xpress SARS-CoV-2/FLU/RSV testing.  Fact Sheet for  Patients: BloggerCourse.com  Fact Sheet for Healthcare Providers: SeriousBroker.it  This test is not yet approved or cleared by the [place] FDA and has been authorized for detection and/or diagnosis of SARS-CoV-2 by FDA under an Emergency Use Authorization (EUA). This EUA will remain in effect (meaning this test can be used) for the duration of the COVID-19 declaration under Section 564(b)(1) of the Act, 21 U.S.C. section 360bbb-3(b)(1), unless the authorization is terminated or revoked.  Performed at [facility], [address] [place], [place] [address]   Blood culture (routine x 2)     Status: Abnormal   Collection Time: 12/04/20  8:46 PM   Specimen: BLOOD LEFT HAND  Result Value Ref Range Status   Specimen Description BLOOD LEFT HAND  Final   Special Requests   Final    BOTTLES DRAWN AEROBIC AND ANAEROBIC Blood Culture adequate volume   Culture  Setup Time   Final    GRAM POSITIVE COCCI IN BOTH AEROBIC AND ANAEROBIC BOTTLES CRITICAL VALUE NOTED.  VALUE IS CONSISTENT WITH PREVIOUSLY REPORTED AND CALLED VALUE.    Culture (A)  Final    STAPHYLOCOCCUS AUREUS SUSCEPTIBILITIES PERFORMED ON PREVIOUS CULTURE WITHIN THE LAST 5 DAYS. Performed at [facility], [address] [place], [place] [address]    Report Status 12/08/2020 FINAL  Final  Blood culture (routine x 2)     Status: Abnormal   Collection Time: 12/04/20  8:59 PM   Specimen: BLOOD  Result Value Ref Range Status   Specimen Description BLOOD LEFT ANTECUBITAL  Final   Special Requests   Final    BOTTLES DRAWN AEROBIC AND ANAEROBIC Blood Culture adequate volume   Culture  Setup Time   Final    GRAM POSITIVE COCCI IN BOTH AEROBIC AND ANAEROBIC BOTTLES CRITICAL RESULT CALLED TO, READ BACK BY AND VERIFIED WITH: [name] PHARMD @1600  12/05/20 EB Performed at [facility], [address], [place], [place]    Culture STAPHYLOCOCCUS  AUREUS (A)  Final   Report Status 12/08/2020 FINAL  Final   Organism ID, Bacteria STAPHYLOCOCCUS AUREUS  Final      Susceptibility   Staphylococcus aureus - MIC*    CIPROFLOXACIN <=0.5 SENSITIVE Sensitive     ERYTHROMYCIN >=8 RESISTANT Resistant     GENTAMICIN <=0.5 SENSITIVE Sensitive     OXACILLIN <=0.25 SENSITIVE Sensitive  TETRACYCLINE <=1 SENSITIVE Sensitive     VANCOMYCIN 1 SENSITIVE Sensitive     TRIMETH/SULFA <=10 SENSITIVE Sensitive     CLINDAMYCIN RESISTANT Resistant     RIFAMPIN <=0.5 SENSITIVE Sensitive     Inducible Clindamycin POSITIVE Resistant     * STAPHYLOCOCCUS AUREUS  Blood Culture ID Panel (Reflexed)     Status: Abnormal   Collection Time: 12/04/20  8:59 PM  Result Value Ref Range Status   Enterococcus faecalis NOT DETECTED NOT DETECTED Final   Enterococcus Faecium NOT DETECTED NOT DETECTED Final   Listeria monocytogenes NOT DETECTED NOT DETECTED Final   Staphylococcus species DETECTED (A) NOT DETECTED Final    Comment: CRITICAL RESULT CALLED TO, READ BACK BY AND VERIFIED WITH: ANDREW MEYER PHARMD @1600  12/05/20 EB    Staphylococcus aureus (BCID) DETECTED (A) NOT DETECTED Final    Comment: CRITICAL RESULT CALLED TO, READ BACK BY AND VERIFIED WITH: ANDREW MEYER PHARMD @1600  12/05/20 EB    Staphylococcus epidermidis NOT DETECTED NOT DETECTED Final   Staphylococcus lugdunensis NOT DETECTED NOT DETECTED Final   Streptococcus species NOT DETECTED NOT DETECTED Final   Streptococcus agalactiae NOT DETECTED NOT DETECTED Final   Streptococcus pneumoniae NOT DETECTED NOT DETECTED Final   Streptococcus pyogenes NOT DETECTED NOT DETECTED Final   A.calcoaceticus-baumannii NOT DETECTED NOT DETECTED Final   Bacteroides fragilis NOT DETECTED NOT DETECTED Final   Enterobacterales NOT DETECTED NOT DETECTED Final   Enterobacter cloacae complex NOT DETECTED NOT DETECTED Final   Escherichia coli NOT DETECTED NOT DETECTED Final   Klebsiella aerogenes NOT DETECTED NOT DETECTED  Final   Klebsiella oxytoca NOT DETECTED NOT DETECTED Final   Klebsiella pneumoniae NOT DETECTED NOT DETECTED Final   Proteus species NOT DETECTED NOT DETECTED Final   Salmonella species NOT DETECTED NOT DETECTED Final   Serratia marcescens NOT DETECTED NOT DETECTED Final   Haemophilus influenzae NOT DETECTED NOT DETECTED Final   Neisseria meningitidis NOT DETECTED NOT DETECTED Final   Pseudomonas aeruginosa NOT DETECTED NOT DETECTED Final   Stenotrophomonas maltophilia NOT DETECTED NOT DETECTED Final   Candida albicans NOT DETECTED NOT DETECTED Final   Candida auris NOT DETECTED NOT DETECTED Final   Candida glabrata NOT DETECTED NOT DETECTED Final   Candida krusei NOT DETECTED NOT DETECTED Final   Candida parapsilosis NOT DETECTED NOT DETECTED Final   Candida tropicalis NOT DETECTED NOT DETECTED Final   Cryptococcus neoformans/gattii NOT DETECTED NOT DETECTED Final   Meth resistant mecA/C and MREJ NOT DETECTED NOT DETECTED Final    Comment: Performed at [facility], [address] [place], [place] [address]  Culture, blood (routine x 2)     Status: None   Collection Time: 12/06/20  5:45 AM   Specimen: BLOOD RIGHT HAND  Result Value Ref Range Status   Specimen Description BLOOD RIGHT HAND  Final   Special Requests   Final    BOTTLES DRAWN AEROBIC AND ANAEROBIC Blood Culture adequate volume   Culture   Final    NO GROWTH 5 DAYS Performed at [facility], [address] [place], [place] [address]    Report Status 12/11/2020 FINAL  Final  Culture, blood (routine x 2)     Status: None   Collection Time: 12/06/20  5:53 AM   Specimen: BLOOD LEFT HAND  Result Value Ref Range Status   Specimen Description BLOOD LEFT HAND  Final   Special Requests   Final    BOTTLES DRAWN AEROBIC AND ANAEROBIC Blood Culture adequate volume  Culture   Final    NO GROWTH 5 DAYS Performed at [facility], [address] [place], [place] [address]    Report Status 12/11/2020 FINAL   Final     Radiology Studies: No results found.  Pamella Pert, MD, PhD [facility]  Between 7 am - 7 pm I am available, please contact me via Amion (for emergencies) or Securechat (non urgent messages)  Between 7 pm - 7 am I am not available, please contact night coverage MD/APP via Amion

## 2020-12-13 LAB — GLUCOSE, CAPILLARY
Glucose-Capillary: 205 mg/dL — ABNORMAL HIGH (ref 70–99)
Glucose-Capillary: 304 mg/dL — ABNORMAL HIGH (ref 70–99)
Glucose-Capillary: 269 mg/dL — ABNORMAL HIGH (ref 70–99)
Glucose-Capillary: 178 mg/dL — ABNORMAL HIGH (ref 70–99)

## 2020-12-13 MED ORDER — INSULIN ASPART PROT & ASPART (70-30 MIX) 100 UNIT/ML ~~LOC~~ SUSP
18.0000 [IU] | Freq: Two times a day (BID) | SUBCUTANEOUS | Status: DC
  Administered 2020-12-13 – 2020-12-15 (×4): 18 [IU] via SUBCUTANEOUS
  Filled 2020-12-13: qty 10

## 2020-12-13 NOTE — Progress Notes (Signed)
[facility] PROGRESS NOTE  Jeffrey Webb VWU:[id number] DOB: [birth year] DOA: 12/04/2020 PCP: Patient, No Pcp Per (Inactive)  Status: Remains inpatient appropriate because:Hemodynamically unstable, Unsafe d/c plan, IV treatments appropriate due to intensity of illness or inability to take PO and Inpatient level of care appropriate due to severity of illness   Dispo: The patient is from: Home              Anticipated d/c is to: Home              Patient currently is not medically stable to d/c.   Difficult to place patient No   Level of care: Med-Surg  Code Status: Full Family Communication:  DVT prophylaxis: Lovenox COVID vaccination status: Unknown    HPI: 45 year old male with DM2, substance abuse with cocaine but no IVDU per patient, tobacco use who comes into the hospital with worsening back pain over several weeks.  He denies any trauma, injury or falls.  An MRI in the ED showed findings concerning for L2 discitis/osteomyelitis and questionable psoas abscess.  Neurosurgery, ID consulted, underwent IR guided biopsy  Subjective: Awake.  Reporting adequate pain control.  Discussed with him that cannot totally relieve the pain but would like to keep pain minimized so he can perform normal ADLs  Objective: Vitals:   12/12/20 2249 12/13/20 0439  BP: 129/84 111/78  Pulse: 77 72  Resp: 16 16  Temp: 98.7 F (37.1 C) 98.6 F (37 C)  SpO2: 99% 100%    Intake/Output Summary (Last 24 hours) at 12/13/2020 0811 Last data filed at 12/13/2020 2956 Gross per 24 hour  Intake 580 ml  Output --  Net 580 ml   Filed Weights   12/04/20 0800  Weight: 88.5 kg    Exam:  Constitutional: Calm, appears comfortable, no acute distress Respiratory: Room air.  Anterior lungs are clear to auscultation. Cardiovascular: S1-S2 without rubs murmurs thrills or gallops.  No peripheral edema.  Regular pulse Abdomen:  LBM 5/29, soft nontender nondistended with normoactive bowel sounds.  Eating  well. Musculoskeletal: No joint deformity upper and lower extremities. Good ROM, no contractures. Normal muscle tone.  Currently tender lower lumbar spine Neurologic: CN 2-12 grossly intact. Sensation intact,  Strength 5/5 x all 4 extremities.  Psychiatric: Alert and oriented x3.  Pleasant affect  Assessment/Plan: Acute problems: MSSA bacteremia with acute discitis/osteomyelitis  -ID recommended 6 wks of IV antibiotics from date of first negative -blood cultures obtained 5/23 finally negative so will need to start 6 weeks timetable from that date (July 11) -Will also need need repeat MRI lumbar spine after antibiotics to make sure left psoas muscle phlegmon has resolved -Following ESR/CRP weekly  -IR was consulted for biopsy vs abscess drainage but IR indicated that there is no fluid collection to drain.   -TTE showed no valvular involvement,  -Continue oxycodone, Lyrica, baclofen and scheduled ibuprofen with PPI   Type 2 diabetes mellitus, uncontrolled, with hyperglycemia -A1c 14.3.   -Continue sliding scale, appreciate diabetes coordinator.   -Increase 70/30 to 18 units for persistent hyperglycemia  Illicit substance use -UDS positive for cocaine, patient denies any intravenous drug use as he "hates needles"  Chronic diastolic CHF -2D echo showed grade 1 diastolic dysfunction, EF 21-30%.  Appears euvolemic  Tobacco use -continue nicotine patch  Back pain due to #1, have discontinued IV morphine and increased oral oxygen    Data Reviewed: Basic Metabolic Panel: Recent Labs  Lab 12/07/20 0244 12/08/20 0908 12/09/20 0719 12/10/20 0831  NA  133* 133* 136 136  K 3.9 4.0 3.6 3.5  CL 99 98 98 100  CO2 26 25 27 27   GLUCOSE 285* 279* 286* 290*  BUN 10 10 11 8   CREATININE 0.91 0.92 0.93 0.86  CALCIUM 9.5 9.2 9.1 9.1   Liver Function Tests: No results for input(s): AST, ALT, ALKPHOS, BILITOT, PROT, ALBUMIN in the last 168 hours. No results for input(s): LIPASE, AMYLASE  in the last 168 hours.   CBG: Recent Labs  Lab 12/12/20 0614 12/12/20 1114 12/12/20 1607 12/12/20 2059 12/13/20 0609  GLUCAP 310* 182* 240* 188* 205*      Active Problems:   Discitis of lumbar region   Psoas abscess, left (HCC)   Type 2 diabetes mellitus with complication, without long-term current use of insulin ([place])   Substance abuse ([place])   Pseudohyponatremia   Consultants:  ID  Procedures:  2D echocardiogram  Antibiotics:  Cefazolin   Time spent: 35 minutes    Erin Hearing ANP  [facility] 7 am - 330 pm/M-F for direct patient care and secure chat Please refer to Amion for contact info 9  days

## 2020-12-13 NOTE — Progress Notes (Signed)
Physical Therapy Treatment Patient Details Name: Jeffrey Webb MRN: [id number] DOB: 01/09/1976 Today's Date: 12/13/2020    History of Present Illness 45 y.o. male, Presented to the ER on 12/04/2020 with back pain for over 3 weeks. MRI and CT of abdomen pelvis and spine demonstrating findings concerning with L2 discitis and/or osteomyelitis and questionable psoas abscess. PMH of type 2 diabetes, substance abuse, tobacco abuse    PT Comments    Pt is making good progress towards his goals. Pt able to ambulate in hallway with min guard assist and minimal increase in pain. Pt reports feeling better after ambulation and asks if he can do push ups. Advised against getting down on floor however trialed wall push ups and pt reports that felt good to use his upper body. Per notes pt will be here on IV antibiotics until July 11. Given pt progress PT will reduce frequency to 1x/week. Pt will not need any additional PT services or equipment at this time.     Follow Up Recommendations  No PT follow up;Supervision - Intermittent     Equipment Recommendations    none   Recommendations for Other Services OT consult     Precautions / Restrictions Precautions Precautions: Fall Restrictions Weight Bearing Restrictions: No    Mobility  Bed Mobility               General bed mobility comments: sitting up in chair on entry    Transfers Overall transfer level: Needs assistance Equipment used: None Transfers: Sit to/from Stand Sit to Stand: Min guard         General transfer comment: min guard for safety  Ambulation/Gait Ambulation/Gait assistance: Min guard Gait Distance (Feet): 300 Feet Assistive device: None Gait Pattern/deviations: Antalgic;Decreased stance time - left;Decreased weight shift to left;Step-through pattern Gait velocity: slow Gait velocity interpretation: 1.31 - 2.62 ft/sec, indicative of limited community ambulator General Gait Details: min guard for safety,  continues to be slightly antalgic, however safety greatly improved         Balance Overall balance assessment: Needs assistance Sitting-balance support: Feet supported Sitting balance-[name]: Fair     Standing balance support: Bilateral upper extremity supported Standing balance-[name]: Fair                              Cognition Arousal/Alertness: Awake/alert Behavior During Therapy: WFL for tasks assessed/performed Overall Cognitive Status: Within Functional Limits for tasks assessed                                        Exercises Other Exercises Other Exercises: wall push ups x 10    General Comments General comments (skin integrity, edema, etc.): pt family in room on entry, gone when returned from ambulation      Pertinent Vitals/Pain Pain Assessment: Faces Faces Pain Scale: Hurts little more Pain Location: low back pain, L hip Pain Descriptors / Indicators: Throbbing;Aching Pain Intervention(s): Monitored during session;Premedicated before session;Repositioned           PT Goals (current goals can now be found in the care plan section) Acute Rehab PT Goals Patient Stated Goal: have less pain PT Goal Formulation: With patient Time For Goal Achievement: 12/22/20 Potential to Achieve Goals: Fair Progress towards PT goals: Progressing toward goals    Frequency    Min 1X/week  PT Plan Frequency needs to be updated;Discharge plan needs to be updated;Equipment recommendations need to be updated       AM-PAC PT "6 Clicks" Mobility   Outcome Measure  Help needed turning from your back to your side while in a flat bed without using bedrails?: None Help needed moving from lying on your back to sitting on the side of a flat bed without using bedrails?: None Help needed moving to and from a bed to a chair (including a wheelchair)?: None Help needed standing up from a chair using your arms (e.g., wheelchair or  bedside chair)?: None Help needed to walk in hospital room?: A Little Help needed climbing 3-5 steps with a railing? : A Lot 6 Click Score: 21    End of Session Equipment Utilized During Treatment: Gait belt Activity Tolerance: Patient tolerated treatment well Patient left: in chair;with call bell/phone within reach Nurse Communication: Mobility status PT Visit Diagnosis: Other abnormalities of gait and mobility (R26.89);Muscle weakness (generalized) (M62.81);Difficulty in walking, not elsewhere classified (R26.2);Pain Pain - Right/Left: Left Pain - part of body: Hip (back)     Time: 1540-0867 PT Time Calculation (min) (ACUTE ONLY): 18 min  Charges:  $Therapeutic Exercise: 8-22 mins                     Jeffrey Webb B. Jeffrey Webb PT, DPT Acute Rehabilitation Services Pager [phone] Office [phone]    Jeffrey Webb 12/13/2020, 5:21 PM

## 2020-12-14 LAB — GLUCOSE, CAPILLARY
Glucose-Capillary: 197 mg/dL — ABNORMAL HIGH (ref 70–99)
Glucose-Capillary: 209 mg/dL — ABNORMAL HIGH (ref 70–99)
Glucose-Capillary: 250 mg/dL — ABNORMAL HIGH (ref 70–99)
Glucose-Capillary: 284 mg/dL — ABNORMAL HIGH (ref 70–99)

## 2020-12-14 NOTE — Progress Notes (Signed)
PROGRESS NOTE  Jeffrey Webb ZOX:[id number] DOB: [birth year] DOA: 12/04/2020 PCP: Patient, No Pcp Per (Inactive)   LOS: 10 days   Brief Narrative / Interim history: 45 year old male with DM2, substance abuse with cocaine but no IVDU per patient, tobacco use who comes into the hospital with worsening back pain over several weeks.  He denies any trauma, injury or falls.  An MRI in the ED showed findings concerning for L2 discitis/osteomyelitis and questionable psoas abscess.  Neurosurgery, ID consulted, underwent IR guided biopsy  Subjective / 24h Interval events: No complaints, back pain better  Assessment & Plan: Principal Problem MSSA bacteremia with acute discitis/osteomyelitis -ID consulted, following.  Currently on cefazolin will likely need several weeks of antibiotics.  He has a history of drug use so not a good candidate for PICC line.  IR was consulted for biopsy versus abscess drainage but IR indicated that there is no fluid collection to drain.  There was concern about left psoas/deep tissue abscess but this is less likely. -TTE showed no valvular involvement, no need for TEE since treatment guidelines for discitis will cover time required for presumed endocarditis -Continue cefazolin, appreciate ID follow-up  Active Problems Type 2 diabetes mellitus, uncontrolled, with hyperglycemia-A1c 14.3.  Continue sliding scale, appreciate diabetes coordinator.  Continue 7030, CBGs controlled  CBG (last 3)  Recent Labs    12/13/20 1601 12/13/20 2143 12/14/20 0557  GLUCAP 269* 178* 197*    Illicit substance use-UDS positive for cocaine, patient denies any intravenous drug use as he "hates needles".  Still not a candidate for PICC line  Chronic diastolic CHF-2D echo showed grade 1 diastolic dysfunction, EF 55-60%.  Appears euvolemic  Tobacco use-continue nicotine patch  Back pain-continue regimen as below  Scheduled Meds: . baclofen  5 mg Oral TID  . enoxaparin (LOVENOX) injection   40 mg Subcutaneous Q24H  . hydrocerin   Topical BID  . ibuprofen  600 mg Oral QID  . insulin aspart  0-15 Units Subcutaneous TID WC  . insulin aspart  0-5 Units Subcutaneous QHS  . insulin aspart protamine- aspart  18 Units Subcutaneous BID WC  . multivitamin with minerals  1 tablet Oral Daily  . nicotine  14 mg Transdermal Daily  . pantoprazole  40 mg Oral Daily  . polyethylene glycol  17 g Oral BID  . pregabalin  100 mg Oral TID   Continuous Infusions: .  ceFAZolin (ANCEF) IV 2 g (12/14/20 0534)   PRN Meds:.acetaminophen **OR** acetaminophen, albuterol, ondansetron **OR** ondansetron (ZOFRAN) IV, oxyCODONE, senna-docusate  Diet Orders (From admission, onward)    Start     Ordered   12/04/20 2249  Diet Carb Modified Fluid consistency: Thin; Room service appropriate? Yes  Diet effective now       Question Answer Comment  Diet-HS Snack? Nothing   Calorie Level Medium 1600-2000   Fluid consistency: Thin   Room service appropriate? Yes      12/04/20 2249          DVT prophylaxis: enoxaparin (LOVENOX) injection 40 mg Start: 12/08/20 1445 SCDs Start: 12/04/20 2249     Code Status: Full Code  Family Communication: No family at bedside  Status is: Inpatient  Remains inpatient appropriate because:Inpatient level of care appropriate due to severity of illness   Dispo: The patient is from: Home              Anticipated d/c is to: Home              Patient  currently is not medically stable to d/c.   Difficult to place patient Yes  Level of care: Med-Surg  Consultants:  ID  Procedures:  2D echo  Microbiology  MSSA bacteremia  Antimicrobials: Cefazolin   Objective: Vitals:   12/13/20 1218 12/13/20 1643 12/14/20 0106 12/14/20 0539  BP: 116/82 112/78 121/77 116/80  Pulse: 79 72 78 68  Resp: 18 18 18 19   Temp: 98.8 F (37.1 C) 99 F (37.2 C) 98.7 F (37.1 C) 98.3 F (36.8 C)  TempSrc: Oral Oral Oral Oral  SpO2: 100% 100% 99% 99%  Weight:      Height:         Intake/Output Summary (Last 24 hours) at 12/14/2020 1041 Last data filed at 12/13/2020 2030 Gross per 24 hour  Intake 700 ml  Output 300 ml  Net 400 ml   Filed Weights   12/04/20 0800  Weight: 88.5 kg    Examination:  Constitutional: He is in no distress, in bed  Data Reviewed: I have independently reviewed following labs and imaging studies  CBC: Recent Labs  Lab 12/08/20 0908 12/09/20 0719 12/10/20 0831  WBC 7.6 5.3 5.8  HGB 13.5 13.2 12.8*  HCT 41.5 41.0 39.7  MCV 81.2 81.3 81.0  PLT 325 317 305   Basic Metabolic Panel: Recent Labs  Lab 12/08/20 0908 12/09/20 0719 12/10/20 0831  NA 133* 136 136  K 4.0 3.6 3.5  CL 98 98 100  CO2 25 27 27   GLUCOSE 279* 286* 290*  BUN 10 11 8   CREATININE 0.92 0.93 0.86  CALCIUM 9.2 9.1 9.1   Liver Function Tests: No results for input(s): AST, ALT, ALKPHOS, BILITOT, PROT, ALBUMIN in the last 168 hours. Coagulation Profile: No results for input(s): INR, PROTIME in the last 168 hours. HbA1C: No results for input(s): HGBA1C in the last 72 hours. CBG: Recent Labs  Lab 12/13/20 0609 12/13/20 1127 12/13/20 1601 12/13/20 2143 12/14/20 0557  GLUCAP 205* 304* 269* 178* 197*    Recent Results (from the past 240 hour(s))  Resp Panel by RT-PCR (Flu A&B, Covid) Nasopharyngeal Swab     Status: None   Collection Time: 12/04/20  6:42 PM   Specimen: Nasopharyngeal Swab; Nasopharyngeal(NP) swabs in vial transport medium  Result Value Ref Range Status   SARS Coronavirus 2 by RT PCR NEGATIVE NEGATIVE Final    Comment: (NOTE) SARS-CoV-2 target nucleic acids are NOT DETECTED.  The SARS-CoV-2 RNA is generally detectable in upper respiratory specimens during the acute phase of infection. The lowest concentration of SARS-CoV-2 viral copies this assay can detect is 138 copies/mL. A negative result does not preclude SARS-Cov-2 infection and should not be used as the sole basis for treatment or other patient management decisions. A  negative result may occur with  improper specimen collection/handling, submission of specimen other than nasopharyngeal swab, presence of viral mutation(s) within the areas targeted by this assay, and inadequate number of viral copies(<138 copies/mL). A negative result must be combined with clinical observations, patient history, and epidemiological information. The expected result is Negative.  Fact Sheet for Patients:  12/15/20  Fact Sheet for Healthcare Providers:  12/16/20  This test is no t yet approved or cleared by the 12/06/20 FDA and  has been authorized for detection and/or diagnosis of SARS-CoV-2 by FDA under an Emergency Use Authorization (EUA). This EUA will remain  in effect (meaning this test can be used) for the duration of the COVID-19 declaration under Section 564(b)(1) of the Act, 21 U.S.C.section  360bbb-3(b)(1), unless the authorization is terminated  or revoked sooner.       Influenza A by PCR NEGATIVE NEGATIVE Final   Influenza B by PCR NEGATIVE NEGATIVE Final    Comment: (NOTE) The Xpert Xpress SARS-CoV-2/FLU/RSV plus assay is intended as an aid in the diagnosis of influenza from Nasopharyngeal swab specimens and should not be used as a sole basis for treatment. Nasal washings and aspirates are unacceptable for Xpert Xpress SARS-CoV-2/FLU/RSV testing.  Fact Sheet for Patients: BloggerCourse.com  Fact Sheet for Healthcare Providers: SeriousBroker.it  This test is not yet approved or cleared by the [place] FDA and has been authorized for detection and/or diagnosis of SARS-CoV-2 by FDA under an Emergency Use Authorization (EUA). This EUA will remain in effect (meaning this test can be used) for the duration of the COVID-19 declaration under Section 564(b)(1) of the Act, 21 U.S.C. section 360bbb-3(b)(1), unless the authorization  is terminated or revoked.  Performed at [facility], [address], [place], [place] [address]   Blood culture (routine x 2)     Status: Abnormal   Collection Time: 12/04/20  8:46 PM   Specimen: BLOOD LEFT HAND  Result Value Ref Range Status   Specimen Description BLOOD LEFT HAND  Final   Special Requests   Final    BOTTLES DRAWN AEROBIC AND ANAEROBIC Blood Culture adequate volume   Culture  Setup Time   Final    GRAM POSITIVE COCCI IN BOTH AEROBIC AND ANAEROBIC BOTTLES CRITICAL VALUE NOTED.  VALUE IS CONSISTENT WITH PREVIOUSLY REPORTED AND CALLED VALUE.    Culture (A)  Final    STAPHYLOCOCCUS AUREUS SUSCEPTIBILITIES PERFORMED ON PREVIOUS CULTURE WITHIN THE LAST 5 DAYS. Performed at [facility], [address] [place], [place] [address]    Report Status 12/08/2020 FINAL  Final  Blood culture (routine x 2)     Status: Abnormal   Collection Time: 12/04/20  8:59 PM   Specimen: BLOOD  Result Value Ref Range Status   Specimen Description BLOOD LEFT ANTECUBITAL  Final   Special Requests   Final    BOTTLES DRAWN AEROBIC AND ANAEROBIC Blood Culture adequate volume   Culture  Setup Time   Final    GRAM POSITIVE COCCI IN BOTH AEROBIC AND ANAEROBIC BOTTLES CRITICAL RESULT CALLED TO, READ BACK BY AND VERIFIED WITH: [name] PHARMD @1600  12/05/20 EB Performed at [facility], [address], [place], [place]    Culture STAPHYLOCOCCUS AUREUS (A)  Final   Report Status 12/08/2020 FINAL  Final   Organism ID, Bacteria STAPHYLOCOCCUS AUREUS  Final      Susceptibility   Staphylococcus aureus - MIC*    CIPROFLOXACIN <=0.5 SENSITIVE Sensitive     ERYTHROMYCIN >=8 RESISTANT Resistant     GENTAMICIN <=0.5 SENSITIVE Sensitive     OXACILLIN <=0.25 SENSITIVE Sensitive     TETRACYCLINE <=1 SENSITIVE Sensitive     VANCOMYCIN 1 SENSITIVE Sensitive     TRIMETH/SULFA <=10 SENSITIVE Sensitive     CLINDAMYCIN RESISTANT Resistant     RIFAMPIN <=0.5 SENSITIVE  Sensitive     Inducible Clindamycin POSITIVE Resistant     * STAPHYLOCOCCUS AUREUS  Blood Culture ID Panel (Reflexed)     Status: Abnormal   Collection Time: 12/04/20  8:59 PM  Result Value Ref Range Status   Enterococcus faecalis NOT DETECTED NOT DETECTED Final   Enterococcus Faecium NOT DETECTED NOT DETECTED Final   Listeria monocytogenes NOT DETECTED NOT DETECTED Final  Staphylococcus species DETECTED (A) NOT DETECTED Final    Comment: CRITICAL RESULT CALLED TO, READ BACK BY AND VERIFIED WITH: ANDREW MEYER PHARMD @1600  12/05/20 EB    Staphylococcus aureus (BCID) DETECTED (A) NOT DETECTED Final    Comment: CRITICAL RESULT CALLED TO, READ BACK BY AND VERIFIED WITH: ANDREW MEYER PHARMD @1600  12/05/20 EB    Staphylococcus epidermidis NOT DETECTED NOT DETECTED Final   Staphylococcus lugdunensis NOT DETECTED NOT DETECTED Final   Streptococcus species NOT DETECTED NOT DETECTED Final   Streptococcus agalactiae NOT DETECTED NOT DETECTED Final   Streptococcus pneumoniae NOT DETECTED NOT DETECTED Final   Streptococcus pyogenes NOT DETECTED NOT DETECTED Final   A.calcoaceticus-baumannii NOT DETECTED NOT DETECTED Final   Bacteroides fragilis NOT DETECTED NOT DETECTED Final   Enterobacterales NOT DETECTED NOT DETECTED Final   Enterobacter cloacae complex NOT DETECTED NOT DETECTED Final   Escherichia coli NOT DETECTED NOT DETECTED Final   Klebsiella aerogenes NOT DETECTED NOT DETECTED Final   Klebsiella oxytoca NOT DETECTED NOT DETECTED Final   Klebsiella pneumoniae NOT DETECTED NOT DETECTED Final   Proteus species NOT DETECTED NOT DETECTED Final   Salmonella species NOT DETECTED NOT DETECTED Final   Serratia marcescens NOT DETECTED NOT DETECTED Final   Haemophilus influenzae NOT DETECTED NOT DETECTED Final   Neisseria meningitidis NOT DETECTED NOT DETECTED Final   Pseudomonas aeruginosa NOT DETECTED NOT DETECTED Final   Stenotrophomonas maltophilia NOT DETECTED NOT DETECTED Final   Candida  albicans NOT DETECTED NOT DETECTED Final   Candida auris NOT DETECTED NOT DETECTED Final   Candida glabrata NOT DETECTED NOT DETECTED Final   Candida krusei NOT DETECTED NOT DETECTED Final   Candida parapsilosis NOT DETECTED NOT DETECTED Final   Candida tropicalis NOT DETECTED NOT DETECTED Final   Cryptococcus neoformans/gattii NOT DETECTED NOT DETECTED Final   Meth resistant mecA/C and MREJ NOT DETECTED NOT DETECTED Final    Comment: Performed at [facility], [address], [place], [address] [place]  Culture, blood (routine x 2)     Status: None   Collection Time: 12/06/20  5:45 AM   Specimen: BLOOD RIGHT HAND  Result Value Ref Range Status   Specimen Description BLOOD RIGHT HAND  Final   Special Requests   Final    BOTTLES DRAWN AEROBIC AND ANAEROBIC Blood Culture adequate volume   Culture   Final    NO GROWTH 5 DAYS Performed at [facility], [address], [place], [address] [place]    Report Status 12/11/2020 FINAL  Final  Culture, blood (routine x 2)     Status: None   Collection Time: 12/06/20  5:53 AM   Specimen: BLOOD LEFT HAND  Result Value Ref Range Status   Specimen Description BLOOD LEFT HAND  Final   Special Requests   Final    BOTTLES DRAWN AEROBIC AND ANAEROBIC Blood Culture adequate volume   Culture   Final    NO GROWTH 5 DAYS Performed at [facility], [address], [place], [address] [place]    Report Status 12/11/2020 FINAL  Final     Radiology Studies: No results found.  [id number], MD, PhD [facility]  Between 7 am - 7 pm I am available, please contact me via Amion (for emergencies) or Securechat (non urgent messages)  Between 7 pm - 7 am I am not available, please contact night coverage MD/APP via Amion

## 2020-12-14 NOTE — Progress Notes (Signed)
Physical Therapy Treatment Patient Details Name: Jeffrey Webb MRN: [id number] DOB: 1976-07-08 Today's Date: 12/14/2020    History of Present Illness 45 y.o. male, Presented to the ER on 12/04/2020 with back pain for over 3 weeks. MRI and CT of abdomen pelvis and spine demonstrating findings concerning with L2 discitis and/or osteomyelitis and questionable psoas abscess. PMH of type 2 diabetes, substance abuse, tobacco abuse    PT Comments    Pt up ambulating in hallway, continued to walk with him to improve gait distance. Out in hallway gave pt HEP to work on between PT visit to keep strength. Pt appreciative. Pt expresses frustration that he is unable to walk over sky way without supervision from staff as he would like to progress his mobility while waiting for IV antibiotic course to be over. PT acknowledged difficulty with being in hospital for extended time. Will work on orders for going outside during next PT session.     Follow Up Recommendations  No PT follow up;Supervision - Intermittent        Recommendations for Other Services OT consult     Precautions / Restrictions Precautions Precautions: Fall Restrictions Weight Bearing Restrictions: No    Mobility  Bed Mobility               General bed mobility comments: met ambulating in hallway    Transfers                    Ambulation/Gait Ambulation/Gait assistance: Min guard Gait Distance (Feet): 600 Feet Assistive device: None Gait Pattern/deviations: Antalgic;Decreased stance time - left;Decreased weight shift to left;Step-through pattern Gait velocity: slow Gait velocity interpretation: 1.31 - 2.62 ft/sec, indicative of limited community ambulator General Gait Details: min guard for safety, continues to be slightly antalgic, however safety greatly improved          Balance Overall balance assessment: Needs assistance Sitting-balance support: Feet supported Sitting balance-[name]: Fair      Standing balance support: Bilateral upper extremity supported Standing balance-[name]: Fair                              Cognition Arousal/Alertness: Awake/alert Behavior During Therapy: WFL for tasks assessed/performed Overall Cognitive Status: Within Functional Limits for tasks assessed                                        Exercises Other Exercises Other Exercises: wall push ups (pectorial )x 10 Other Exercises: wall push up (tricep) x10 Other Exercises: squats x 20    General Comments General comments (skin integrity, edema, etc.): pt with frustration about not being able to walk off unit      Pertinent Vitals/Pain Pain Assessment: Faces Faces Pain Scale: Hurts a little bit Pain Location: low back pain, L hip Pain Descriptors / Indicators: Throbbing;Aching Pain Intervention(s): Limited activity within patient's tolerance;Monitored during session;Repositioned           PT Goals (current goals can now be found in the care plan section) Acute Rehab PT Goals Patient Stated Goal: have less pain PT Goal Formulation: With patient Time For Goal Achievement: 12/22/20 Potential to Achieve Goals: Fair Progress towards PT goals: Progressing toward goals    Frequency    Min 1X/week      PT Plan Frequency needs to be updated;Discharge plan needs to be updated;Equipment  recommendations need to be updated       AM-PAC PT "6 Clicks" Mobility   Outcome Measure  Help needed turning from your back to your side while in a flat bed without using bedrails?: None Help needed moving from lying on your back to sitting on the side of a flat bed without using bedrails?: None Help needed moving to and from a bed to a chair (including a wheelchair)?: None Help needed standing up from a chair using your arms (e.g., wheelchair or bedside chair)?: None Help needed to walk in hospital room?: None Help needed climbing 3-5 steps with a railing? : A  Lot 6 Click Score: 22    End of Session Equipment Utilized During Treatment: Gait belt Activity Tolerance: Patient tolerated treatment well Patient left: in chair;with call bell/phone within reach Nurse Communication: Mobility status PT Visit Diagnosis: Other abnormalities of gait and mobility (R26.89);Muscle weakness (generalized) (M62.81);Difficulty in walking, not elsewhere classified (R26.2);Pain Pain - Right/Left: Left Pain - part of body: Hip (back)     Time: 7282-0601 PT Time Calculation (min) (ACUTE ONLY): 17 min  Charges:  $Therapeutic Exercise: 8-22 mins                     Jeffrey Webb B. Jeffrey Webb PT, DPT Acute Rehabilitation Services Pager [phone] Office [phone]    [place] 12/14/2020, 12:54 PM

## 2020-12-15 ENCOUNTER — Telehealth: Payer: Self-pay

## 2020-12-15 LAB — COMPREHENSIVE METABOLIC PANEL
ALT: 59 U/L — ABNORMAL HIGH (ref 0–44)
AST: 89 U/L — ABNORMAL HIGH (ref 15–41)
Albumin: 2.7 g/dL — ABNORMAL LOW (ref 3.5–5.0)
Alkaline Phosphatase: 121 U/L (ref 38–126)
Anion gap: 12 (ref 5–15)
BUN: 14 mg/dL (ref 6–20)
CO2: 24 mmol/L (ref 22–32)
Calcium: 8.9 mg/dL (ref 8.9–10.3)
Chloride: 100 mmol/L (ref 98–111)
Creatinine, Ser: 0.8 mg/dL (ref 0.61–1.24)
GFR, Estimated: >60 mL/min (ref >60)
Glucose, Bld: 298 mg/dL — ABNORMAL HIGH (ref 70–99)
Potassium: 4.1 mmol/L (ref 3.5–5.1)
Sodium: 136 mmol/L (ref 135–145)
Total Bilirubin: 0.2 mg/dL — ABNORMAL LOW (ref 0.3–1.2)
Total Protein: 7 g/dL (ref 6.5–8.1)

## 2020-12-15 LAB — CBC
HCT: 38.4 % — ABNORMAL LOW (ref 39.0–52.0)
Hemoglobin: 12.2 g/dL — ABNORMAL LOW (ref 13.0–17.0)
MCH: 26.4 pg (ref 26.0–34.0)
MCHC: 31.8 g/dL (ref 30.0–36.0)
MCV: 83.1 fL (ref 80.0–100.0)
Platelets: 302 10*3/uL (ref 150–400)
RBC: 4.62 MIL/uL (ref 4.22–5.81)
RDW: 12.9 % (ref 11.5–15.5)
WBC: 6.3 10*3/uL (ref 4.0–10.5)
nRBC: 0 % (ref 0.0–0.2)

## 2020-12-15 LAB — GLUCOSE, CAPILLARY
Glucose-Capillary: 247 mg/dL — ABNORMAL HIGH (ref 70–99)
Glucose-Capillary: 199 mg/dL — ABNORMAL HIGH (ref 70–99)
Glucose-Capillary: 221 mg/dL — ABNORMAL HIGH (ref 70–99)
Glucose-Capillary: 153 mg/dL — ABNORMAL HIGH (ref 70–99)

## 2020-12-15 MED ORDER — INSULIN ASPART PROT & ASPART (70-30 MIX) 100 UNIT/ML ~~LOC~~ SUSP
22.0000 [IU] | Freq: Two times a day (BID) | SUBCUTANEOUS | Status: DC
  Administered 2020-12-15 – 2020-12-17 (×4): 22 [IU] via SUBCUTANEOUS

## 2020-12-15 NOTE — TOC Progression Note (Signed)
Transition of Care [facility]) - Progression Note    Patient Details  Name: Jeffrey Webb MRN: [id number] Date of Birth: 11-07-1975  Transition of Care [facility]) CM/SW [place], RN Phone Number: 12/15/2020, 2:30 PM  Clinical Narrative:    CM met with the patient at the bedside in regards to transitions of care to home.  The patient is receiving Cefazolin IV until 01/17/2021 for MSSA bacteremia.  The patient has history of cocaine use and will remain in the hospital until IV antibiotics are complete.  CM and MSW will continue to follow the patient for discharge planning.   Expected Discharge Plan: Home/Self Care Barriers to Discharge: Continued Medical Work up (Patient will need IV antibiotics in the hospital prior to discharge to home)  Expected Discharge Plan and Services Expected Discharge Plan: Home/Self Care In-house Referral: Clinical Social Work,Financial Counselor Discharge Planning Services: CM Consult,Indigent Health Clinic,Medication Assistance,Follow-up appt scheduled Post Acute Care Choice: Durable Medical Equipment Living arrangements for the past 2 months: Single Family Home ([facility] with girlfriend at [address], [place], [place])                 DME Arranged: [place] rolling DME Agency: [employer]                   Social Determinants of Health ([place]) Interventions    Readmission Risk Interventions No flowsheet data found.

## 2020-12-15 NOTE — Progress Notes (Signed)
Inpatient Diabetes Program Recommendations  AACE/ADA: New Consensus Statement on Inpatient Glycemic Control (2015)  Target Ranges:  Prepandial:   less than 140 mg/dL      Peak postprandial:   less than 180 mg/dL (1-2 hours)      Critically ill patients:  140 - 180 mg/dL   Lab Results  Component Value Date   GLUCAP 247 (H) 12/15/2020   HGBA1C 14.3 (H) 12/05/2020    Review of Glycemic Control Results for Jeffrey Webb, Jeffrey Webb (MRN [id number]) as of 12/15/2020 10:27  Ref. Range 12/14/2020 05:57 12/14/2020 11:40 12/14/2020 16:07 12/14/2020 21:33 12/15/2020 06:03  Glucose-Capillary Latest Ref Range: 70 - 99 mg/dL 299 (H) 242 (H) 683 (H) 284 (H) 247 (H)   Diabetes history:  DM2 Current orders for Inpatient glycemic control:  70/30 18 units BID Novolog 0-15 units TID and 0-5 QHS   Inpatient Diabetes Program Recommendations:    Please consider 70/30 22 units BID (30.8 units of basal QD and 6.6 units meal coverage BID).  Will continue to follow while inpatient.  Thank you, Dulce Sellar, RN, BSN Diabetes Coordinator Inpatient Diabetes Program [phone] (team pager from 8a-5p)

## 2020-12-15 NOTE — Telephone Encounter (Signed)
Copied from CRM [id number]. Topic: Appointment Scheduling - Scheduling Inquiry for Clinic >> Dec 09, 2020 10:09 AM Daphine Deutscher D wrote: Reason for CCRN:  Pt's mom Arabella called saying her son was in the hospital and will need financial assistance when he gets out of the hospital . He has no ins and will be needing PT.  The hospital gave him CHW's number and told him to call to see if they can help.  CB#  [phone]   Call placed to patient, line rang busy. If patient calls back please advise patient or his mom that in order to apply for financial counseling he needs to complete the hospital follow up appointment scheduled with [place]. At that appointment when he checks out we will provide a financial application packet to him and schedule for him to meet with Mikle Bosworth our financial advisor. If he is approved for coverage all services that he receives at his initial appointment, other than labs, can be covered retroactively through his assistance letter, IF approved. If patient has any further questions a message can be sent to financial counselor for further follow up questions.

## 2020-12-15 NOTE — Progress Notes (Signed)
[facility] PROGRESS NOTE  Jeffrey Webb OEU:[id number] DOB: [birth year] DOA: 12/04/2020 PCP: Patient, No Pcp Per (Inactive)  Status: Remains inpatient appropriate because:Hemodynamically unstable, Unsafe d/c plan, IV treatments appropriate due to intensity of illness or inability to take PO and Inpatient level of care appropriate due to severity of illness   Dispo: The patient is from: Home              Anticipated d/c is to: Home              Patient currently is not medically stable to d/c.   Difficult to place patient No   Level of care: Med-Surg  Code Status: Full Family Communication:  DVT prophylaxis: Lovenox has refused dosing COVID vaccination status: Unknown    HPI: 45 year old male with DM2, substance abuse with cocaine but no IVDU per patient, tobacco use who comes into the hospital with worsening back pain over several weeks.  He denies any trauma, injury or falls.  An MRI in the ED showed findings concerning for L2 discitis/osteomyelitis and questionable psoas abscess.  Neurosurgery, ID consulted, underwent IR guided biopsy  Subjective: Without specific complaints.  Talking on phone with significant other.  Made aware that I stopped Advil due to concerns over causing liver irritation.  Denies history of hepatitis.  Objective: Vitals:   12/15/20 0017 12/15/20 0451  BP: 121/68 118/74  Pulse: 73 67  Resp: 18 18  Temp: 98.6 F (37 C) 98.6 F (37 C)  SpO2: 100% 100%    Intake/Output Summary (Last 24 hours) at 12/15/2020 0839 Last data filed at 12/15/2020 0300 Gross per 24 hour  Intake 1160 ml  Output --  Net 1160 ml   Filed Weights   12/04/20 0800  Weight: 88.5 kg    Exam:  Constitutional: Alert, calm, no acute distress Respiratory: No lung sounds bilaterally, stable on room air. Cardiovascular: S1-S2, regular nontachycardic pulse, no peripheral edema. Abdomen:  LBM 6/1, soft nontender nondistended with normoactive bowel sounds.  Eating  well Musculoskeletal: No joint deformity upper and lower extremities. Good ROM, no contractures. Normal muscle tone.  Currently tender lower lumbar spine Neurologic: CN 2-12 grossly intact. Sensation intact,  Strength 5/5 x all 4 extremities.  Psychiatric: Alert and oriented x3.  Pleasant affect.  Assessment/Plan: Acute problems: MSSA bacteremia with acute discitis/osteomyelitis  -ID recommended 6 wks of IV antibiotics from date of first negative -blood cultures obtained 5/23 finally negative so will need to start 6 weeks timetable from that date (July 11) -Will also need need repeat MRI lumbar spine after antibiotics to make sure left psoas muscle phlegmon has resolved -Following ESR/CRP weekly  -IR was consulted for biopsy vs abscess drainage but IR indicated that there is no fluid collection to drain.   -TTE showed no valvular involvement,  -Continue oxycodone, Lyrica, baclofen and scheduled ibuprofen with PPI   Nonobstructive transaminitis -Suspect related to medications -NSAIDs/Advil discontinued -Follow labs  Type 2 diabetes mellitus, uncontrolled, with hyperglycemia -A1c 14.3.   -Continue sliding scale, appreciate diabetes coordinator.   -6/1 Increase 70/30 to 22 units for persistent hyperglycemia  Illicit substance use -UDS positive for cocaine, patient denies any intravenous drug use as he "hates needles"  Chronic diastolic CHF -2D echo showed grade 1 diastolic dysfunction, EF 15-40%.  Appears euvolemic  Tobacco use -continue nicotine patch  Back pain due to #1, have discontinued IV morphine and increased oral oxygen    Data Reviewed: Basic Metabolic Panel: Recent Labs  Lab 12/08/20 0908 12/09/20 0719  12/10/20 0831 12/15/20 0709  NA 133* 136 136 136  K 4.0 3.6 3.5 4.1  CL 98 98 100 100  CO2 25 27 27 24   GLUCOSE 279* 286* 290* 298*  BUN 10 11 8 14   CREATININE 0.92 0.93 0.86 0.80  CALCIUM 9.2 9.1 9.1 8.9   Liver Function Tests: Recent Labs  Lab  12/15/20 0709  AST 89*  ALT 59*  ALKPHOS 121  BILITOT 0.2*  PROT 7.0  ALBUMIN 2.7*   No results for input(s): LIPASE, AMYLASE in the last 168 hours.   CBG: Recent Labs  Lab 12/14/20 0557 12/14/20 1140 12/14/20 1607 12/14/20 2133 12/15/20 0603  GLUCAP 197* 209* 250* 284* 247*      Active Problems:   Discitis of lumbar region   Psoas abscess, left (HCC)   Type 2 diabetes mellitus with complication, without long-term current use of insulin ([place])   Substance abuse ([place])   Pseudohyponatremia   Consultants:  ID  Procedures:  2D echocardiogram  Antibiotics:  Cefazolin   Time spent: 35 minutes    Erin Hearing ANP  [facility] 7 am - 330 pm/M-F for direct patient care and secure chat Please refer to Amion for contact info 11  days

## 2020-12-16 ENCOUNTER — Inpatient Hospital Stay (HOSPITAL_COMMUNITY): Payer: Self-pay

## 2020-12-16 LAB — COMPREHENSIVE METABOLIC PANEL
ALT: 75 U/L — ABNORMAL HIGH (ref 0–44)
AST: 131 U/L — ABNORMAL HIGH (ref 15–41)
Albumin: 2.8 g/dL — ABNORMAL LOW (ref 3.5–5.0)
Alkaline Phosphatase: 125 U/L (ref 38–126)
Anion gap: 10 (ref 5–15)
BUN: 12 mg/dL (ref 6–20)
CO2: 27 mmol/L (ref 22–32)
Calcium: 9 mg/dL (ref 8.9–10.3)
Chloride: 100 mmol/L (ref 98–111)
Creatinine, Ser: 0.95 mg/dL (ref 0.61–1.24)
GFR, Estimated: >60 mL/min (ref >60)
Glucose, Bld: 225 mg/dL — ABNORMAL HIGH (ref 70–99)
Potassium: 4.1 mmol/L (ref 3.5–5.1)
Sodium: 137 mmol/L (ref 135–145)
Total Bilirubin: 0.2 mg/dL — ABNORMAL LOW (ref 0.3–1.2)
Total Protein: 7.3 g/dL (ref 6.5–8.1)

## 2020-12-16 LAB — HEPATITIS PANEL, ACUTE
HCV Ab: NONREACTIVE
Hep A IgM: NONREACTIVE
Hep B C IgM: NONREACTIVE
Hepatitis B Surface Ag: NONREACTIVE

## 2020-12-16 LAB — GLUCOSE, CAPILLARY
Glucose-Capillary: 238 mg/dL — ABNORMAL HIGH (ref 70–99)
Glucose-Capillary: 133 mg/dL — ABNORMAL HIGH (ref 70–99)
Glucose-Capillary: 218 mg/dL — ABNORMAL HIGH (ref 70–99)
Glucose-Capillary: 190 mg/dL — ABNORMAL HIGH (ref 70–99)

## 2020-12-16 MED ORDER — LIDOCAINE 4 % EX CREA
TOPICAL_CREAM | Freq: Four times a day (QID) | CUTANEOUS | Status: DC | PRN
  Filled 2020-12-16: qty 5

## 2020-12-16 MED ORDER — ZOLPIDEM TARTRATE 5 MG PO TABS
5.0000 mg | ORAL_TABLET | Freq: Every day | ORAL | Status: DC
  Administered 2020-12-16 – 2020-12-19 (×4): 5 mg via ORAL
  Filled 2020-12-16 (×4): qty 1

## 2020-12-16 MED ORDER — APIXABAN 2.5 MG PO TABS
2.5000 mg | ORAL_TABLET | Freq: Two times a day (BID) | ORAL | Status: DC
  Administered 2020-12-16 – 2020-12-23 (×15): 2.5 mg via ORAL
  Filled 2020-12-16 (×15): qty 1

## 2020-12-16 MED ORDER — BACLOFEN 10 MG PO TABS
10.0000 mg | ORAL_TABLET | Freq: Three times a day (TID) | ORAL | Status: DC
  Administered 2020-12-16 – 2020-12-17 (×3): 10 mg via ORAL
  Filled 2020-12-16 (×3): qty 1

## 2020-12-16 NOTE — Progress Notes (Signed)
OT Treatment Note Foam added to shoe to offload painful area of R foot during ambulation Pt reports significant decrease in pain. Pt Given extra padding and educated on use and placement of padding in shoe/croc. Will follow up tomorrow. Discussed with NP.   12/16/20 1532  OT Visit Information  Last OT Received On 12/16/20  Assistance Needed +1  History of Present Illness 45 y.o. male, Presented to the ER on 12/04/2020 with back pain for over 3 weeks. MRI and CT of abdomen pelvis and spine demonstrating findings concerning with L2 discitis and/or osteomyelitis and questionable psoas abscess. PMH of type 2 diabetes, substance abuse, tobacco abuse  Precautions  Precautions None  Required Braces or Orthoses Other Brace (adapted shoe/croc)  Pain Assessment  Pain Assessment Faces  Faces Pain Scale 2  Pain Location  (R foot)  Pain Descriptors / Indicators Discomfort  Pain Intervention(s) Limited activity within patient's tolerance;Repositioned  Cognition  Arousal/Alertness Awake/alert  Behavior During Therapy WFL for tasks assessed/performed  Overall Cognitive Status Within Functional Limits for tasks assessed  OT Assessment  OT Recommendation/Assessment Patient needs continued OT Services  OT Visit Diagnosis Other abnormalities of gait and mobility (R26.89);Pain  OT Problem List Pain  OT Plan  OT Frequency (ACUTE ONLY) Min 1X/week  OT Treatment/Interventions (ACUTE ONLY) Therapeutic activities;Patient/family education  AM-PAC OT "6 Clicks" Daily Activity Outcome Measure (Version 2)  Help from another person eating meals? 4  Help from another person taking care of personal grooming? 4  Help from another person toileting, which includes using toliet, bedpan, or urinal? 4  Help from another person bathing (including washing, rinsing, drying)? 4  Help from another person to put on and taking off regular upper body clothing? 4  Help from another person to put on and taking off regular lower  body clothing? 4  6 Click Score 24  OT Recommendation  Follow Up Recommendations No OT follow up  OT Equipment None recommended by OT  Individuals Consulted  Consulted and Agree with Results and Recommendations Patient  Acute Rehab OT Goals  Patient Stated Goal to reduce pain in foot  OT Goal Formulation With patient  Time For Goal Achievement 12/23/20  Potential to Achieve Goals Good  OT Time Calculation  OT Start Time (ACUTE ONLY) 1207  OT Stop Time (ACUTE ONLY) 1227  OT Time Calculation (min) 20 min  OT General Charges  $OT Visit 1 Visit  OT Treatments  $Therapeutic Activity 8-22 mins  Luisa Dago, OT/L   Acute OT Clinical Specialist Acute Rehabilitation Services Pager [phone] Office [phone]

## 2020-12-16 NOTE — Progress Notes (Signed)
Occupational Therapy Evaluation Patient Details Name: Jeffrey Webb MRN: [id number] DOB: 30-Jan-1976 Today's Date: 12/16/2020    History of Present Illness 45 y.o. male, Presented to the ER on 12/04/2020 with back pain for over 3 weeks. MRI and CT of abdomen pelvis and spine demonstrating findings concerning with L2 discitis and/or osteomyelitis and questionable psoas abscess. PMH of type 2 diabetes, substance abuse, tobacco abuse   Clinical Impression   Received splint order. Pt has painful area, on plantar surface of R foot, causing pain with ambulation. NP further assessing area on foot. Pt independent with ADL and walking independently in room. Educated on importance of completing R heel cord stretches. Will return to adapt shoe.    Follow Up Recommendations  No OT follow up    Equipment Recommendations  None recommended by OT    Recommendations for Other Services       Precautions / Restrictions Precautions Precautions: None      Mobility Bed Mobility Overal bed mobility: Independent                  Transfers Overall transfer level: Modified independent               General transfer comment: walking around independently in room    Balance                                           ADL either performed or assessed with clinical judgement   ADL Overall ADL's : Independent                                             Vision         Perception     Praxis      Pertinent Vitals/Pain Pain Assessment: Faces Faces Pain Scale: Hurts a little bit Pain Location:  (R foot - plantar/arch) Pain Descriptors / Indicators: Discomfort Pain Intervention(s): Limited activity within patient's tolerance     Hand Dominance     Extremity/Trunk Assessment             Communication     Cognition Arousal/Alertness: Awake/alert Behavior During Therapy: WFL for tasks assessed/performed Overall Cognitive Status: Within  Functional Limits for tasks assessed                                     General Comments       Exercises Other Exercises Other Exercises: sustained heel cord stretches x 10 seconds; x10   Shoulder Instructions      Home Living Family/patient expects to be discharged to:: Private residence Living Arrangements: Spouse/significant other Available Help at Discharge: Family;Available 24 hours/day Type of Home: House Home Access: Stairs to enter     Home Layout: One level     Bathroom Shower/Tub: Chief Strategy Officer: Standard Bathroom Accessibility: Yes              Prior Functioning/Environment Level of Independence: Independent (until a few weeks ago due ot back pain)                 OT Problem List: Pain      OT Treatment/Interventions: Therapeutic activities;Patient/family education  OT Goals(Current goals can be found in the care plan section) Acute Rehab OT Goals Patient Stated Goal: to reduce pain in foot OT Goal Formulation: With patient Time For Goal Achievement: 12/23/20 Potential to Achieve Goals: Good  OT Frequency: Min 1X/week   Barriers to D/C:            Co-evaluation              AM-PAC OT "6 Clicks" Daily Activity     Outcome Measure Help from another person eating meals?: None Help from another person taking care of personal grooming?: None Help from another person toileting, which includes using toliet, bedpan, or urinal?: None Help from another person bathing (including washing, rinsing, drying)?: None Help from another person to put on and taking off regular upper body clothing?: None Help from another person to put on and taking off regular lower body clothing?: None 6 Click Score: 24   End of Session    Activity Tolerance: Patient tolerated treatment well Patient left: Other (comment);in bed (sitting EOB)  OT Visit Diagnosis: Other abnormalities of gait and mobility (R26.89);Pain                 Time: 6834-1962 OT Time Calculation (min): 17 min Charges:  OT General Charges $OT Visit: 1 Visit OT Evaluation $OT Eval Low Complexity: 1 Low  Kamarah Bilotta, OT/L   Acute OT Clinical Specialist Acute Rehabilitation Services Pager [phone] Office [phone]   [facility] 12/16/2020, 3:29 PM

## 2020-12-16 NOTE — Progress Notes (Signed)
[facility] PROGRESS NOTE  Jeffrey Webb UMP:[id number] DOB: August 27, 1975 DOA: 12/04/2020 PCP: Patient, No Pcp Per (Inactive)  Status: Remains inpatient appropriate because:Hemodynamically unstable, Unsafe d/c plan, IV treatments appropriate due to intensity of illness or inability to take PO and Inpatient level of care appropriate due to severity of illness   Dispo: The patient is from: Home              Anticipated d/c is to: Home              Patient currently is not medically stable to d/c.   Difficult to place patient No   Level of care: Med-Surg  Code Status: Full Family Communication:  DVT prophylaxis: Refused Lovenox 2/2 fear of needles-changed to apixaban COVID vaccination status: Unknown    HPI: 45 year old male with DM2, substance abuse with cocaine but no IVDU per patient, tobacco use who comes into the hospital with worsening back pain over several weeks.  He denies any trauma, injury or falls.  An MRI in the ED showed findings concerning for L2 discitis/osteomyelitis and questionable psoas abscess.  Neurosurgery, ID consulted, underwent IR guided biopsy  Subjective: Reports continued low back spasms. Tells me bottom of right foot very painful with weight bearing. On exam looks like a skin abscess. Pt sts had previously drained spontaneously.  Objective: Vitals:   12/15/20 2240 12/16/20 0449  BP: 115/71 120/87  Pulse: 78 77  Resp: 16 18  Temp: 98.9 F (37.2 C) 99 F (37.2 C)  SpO2: 99% 100%    Intake/Output Summary (Last 24 hours) at 12/16/2020 0816 Last data filed at 12/16/2020 0602 Gross per 24 hour  Intake 1480 ml  Output --  Net 1480 ml   Filed Weights   12/04/20 0800  Weight: 88.5 kg    Exam:  Constitutional: Calm NAD, uncomfortable 2/2 foot and back pain Respiratory: Lungs CTA, RA Cardiovascular: S1S2, regular pulse, normotensive, no LE edema Abdomen:  LBM 6/1, soft, NT, ND, eating well Musculoskeletal: No joint deformity upper and lower  extremities. Good ROM, no contractures. Normal muscle tone.  Currently tender lower lumbar spine Neurologic: CN 2-12 grossly intact. Sensation intact,  Strength 5/5 x all 4 extremities.  Psychiatric: A & O x 3  Assessment/Plan: Acute problems: MSSA bacteremia with acute discitis/osteomyelitis  -ID recommended 6 wks of IV antibiotics from date of first negative -blood cultures obtained 5/23 finally negative so will need to start 6 weeks timetable from that date (July 11) -Will also need need repeat MRI lumbar spine after antibiotics to make sure left psoas muscle phlegmon has resolved -Following ESR/CRP weekly  -IR was consulted for biopsy vs abscess drainage but IR indicated that there is no fluid collection to drain.   -TTE showed no valvular involvement,  -Continue oxycodone and Lyrica. Increase baclofen dose and add 4% Lidocaine cream for back pain.Marland Kitchen NSAIDs dc'd 2/2 transaminitis  Right foot pain -appearance c/w resolving plantar abscess but quite tender -continue anbxs as above -XR foot w/o osteo -OT assisted with offloading foot to decrease pain with mobility  Nonobstructive transaminitis -Suspect related to medications -NSAIDs/Advil discontinued -Follow labs -Hepatitis pnl negative  Type 2 diabetes mellitus, uncontrolled, with hyperglycemia -A1c 14.3.   -Continue sliding scale, appreciate diabetes coordinator.   -6/1 Increase 70/30 to 22 units for persistent hyperglycemia  Illicit substance use -UDS positive for cocaine, patient denies any intravenous drug use as he "hates needles"  Chronic diastolic CHF -2D echo showed grade 1 diastolic dysfunction, EF 40-08%.  Appears  euvolemic  Tobacco use -continue nicotine patch  Back pain due to #1, have discontinued IV morphine and increased oral oxygen    Data Reviewed: Basic Metabolic Panel: Recent Labs  Lab 12/10/20 0831 12/15/20 0709 12/16/20 0703  NA 136 136 137  K 3.5 4.1 4.1  CL 100 100 100  CO2 27 24 27    GLUCOSE 290* 298* 225*  BUN 8 14 12   CREATININE 0.86 0.80 0.95  CALCIUM 9.1 8.9 9.0   Liver Function Tests: Recent Labs  Lab 12/15/20 0709 12/16/20 0703  AST 89* 131*  ALT 59* 75*  ALKPHOS 121 125  BILITOT 0.2* 0.2*  PROT 7.0 7.3  ALBUMIN 2.7* 2.8*   No results for input(s): LIPASE, AMYLASE in the last 168 hours.   CBG: Recent Labs  Lab 12/15/20 0603 12/15/20 1232 12/15/20 1605 12/15/20 2118 12/16/20 0621  GLUCAP 247* 199* 221* 153* 238*      Active Problems:   Discitis of lumbar region   Psoas abscess, left (HCC)   Type 2 diabetes mellitus with complication, without long-term current use of insulin ([place])   Substance abuse ([place])   Pseudohyponatremia   Consultants:  ID  Procedures:  2D echocardiogram  Antibiotics:  Cefazolin   Time spent: 35 minutes    Erin Hearing ANP  [facility] 7 am - 330 pm/M-F for direct patient care and secure chat Please refer to Amion for contact info 12  days

## 2020-12-17 ENCOUNTER — Inpatient Hospital Stay (HOSPITAL_COMMUNITY): Payer: Self-pay

## 2020-12-17 DIAGNOSIS — L02619 Cutaneous abscess of unspecified foot: Secondary | ICD-10-CM

## 2020-12-17 DIAGNOSIS — L089 Local infection of the skin and subcutaneous tissue, unspecified: Secondary | ICD-10-CM

## 2020-12-17 DIAGNOSIS — M722 Plantar fascial fibromatosis: Secondary | ICD-10-CM

## 2020-12-17 LAB — GLUCOSE, CAPILLARY
Glucose-Capillary: 239 mg/dL — ABNORMAL HIGH (ref 70–99)
Glucose-Capillary: 162 mg/dL — ABNORMAL HIGH (ref 70–99)
Glucose-Capillary: 164 mg/dL — ABNORMAL HIGH (ref 70–99)
Glucose-Capillary: 233 mg/dL — ABNORMAL HIGH (ref 70–99)

## 2020-12-17 MED ORDER — LIDOCAINE 5 % EX OINT
TOPICAL_OINTMENT | Freq: Four times a day (QID) | CUTANEOUS | Status: DC | PRN
  Filled 2020-12-17: qty 35.44

## 2020-12-17 MED ORDER — INSULIN ASPART 100 UNIT/ML IJ SOLN
4.0000 [IU] | Freq: Three times a day (TID) | INTRAMUSCULAR | Status: DC
  Administered 2020-12-17 – 2020-12-23 (×17): 4 [IU] via SUBCUTANEOUS

## 2020-12-17 MED ORDER — BACLOFEN 10 MG PO TABS
10.0000 mg | ORAL_TABLET | Freq: Four times a day (QID) | ORAL | Status: DC
  Administered 2020-12-17 – 2020-12-23 (×25): 10 mg via ORAL
  Filled 2020-12-17 (×26): qty 1

## 2020-12-17 MED ORDER — INSULIN ASPART PROT & ASPART (70-30 MIX) 100 UNIT/ML ~~LOC~~ SUSP
26.0000 [IU] | Freq: Two times a day (BID) | SUBCUTANEOUS | Status: DC
  Administered 2020-12-17 – 2020-12-21 (×9): 26 [IU] via SUBCUTANEOUS

## 2020-12-17 NOTE — Consult Note (Signed)
Reason for Consult:Right foot abscess Referring Physician: Marlin Canary Time called: 1322 Time at bedside: 1330   Jeffrey Webb is an 45 y.o. male.  HPI: Jeffrey Webb was admitted a couple of weeks ago with discitis. About a week prior to that he had a boil on the bottom of his right foot. He underwent I&D on admission and it seemed to be doing better. Here in the last several days the foot has starting hurting again and he feels like the fluid is back. It's making it difficult for him to ambulate. He denies known fevers, chills, sweats, N/V.  History reviewed. No pertinent past medical history.  History reviewed. No pertinent surgical history.  No family history on file.  Social History:  reports that he has been smoking. He has never used smokeless tobacco. He reports previous alcohol use. He reports previous drug use.  Allergies: No Known Allergies  Medications: I have reviewed the patient's current medications.  Results for orders placed or performed during the hospital encounter of 12/04/20 (from the past 48 hour(s))  Glucose, capillary     Status: Abnormal   Collection Time: 12/15/20  4:05 PM  Result Value Ref Range   Glucose-Capillary 221 (H) 70 - 99 mg/dL    Comment: Glucose reference range applies only to samples taken after fasting for at least 8 hours.   Comment 1 Notify RN   Glucose, capillary     Status: Abnormal   Collection Time: 12/15/20  9:18 PM  Result Value Ref Range   Glucose-Capillary 153 (H) 70 - 99 mg/dL    Comment: Glucose reference range applies only to samples taken after fasting for at least 8 hours.  Glucose, capillary     Status: Abnormal   Collection Time: 12/16/20  6:21 AM  Result Value Ref Range   Glucose-Capillary 238 (H) 70 - 99 mg/dL    Comment: Glucose reference range applies only to samples taken after fasting for at least 8 hours.  Comprehensive metabolic panel     Status: Abnormal   Collection Time: 12/16/20  7:03 AM  Result Value Ref Range    Sodium 137 135 - 145 mmol/L   Potassium 4.1 3.5 - 5.1 mmol/L   Chloride 100 98 - 111 mmol/L   CO2 27 22 - 32 mmol/L   Glucose, Bld 225 (H) 70 - 99 mg/dL    Comment: Glucose reference range applies only to samples taken after fasting for at least 8 hours.   BUN 12 6 - 20 mg/dL   Creatinine, Ser 0.93 0.61 - 1.24 mg/dL   Calcium 9.0 8.9 - 26.7 mg/dL   Total Protein 7.3 6.5 - 8.1 g/dL   Albumin 2.8 (L) 3.5 - 5.0 g/dL   AST 124 (H) 15 - 41 U/L   ALT 75 (H) 0 - 44 U/L   Alkaline Phosphatase 125 38 - 126 U/L   Total Bilirubin 0.2 (L) 0.3 - 1.2 mg/dL   GFR, Estimated >58 >09 mL/min    Comment: (NOTE) Calculated using the CKD-EPI Creatinine Equation (2021)    Anion gap 10 5 - 15    Comment: Performed at [facility], [address], [place], [place] [address]  Hepatitis panel, acute     Status: None   Collection Time: 12/16/20  7:09 AM  Result Value Ref Range   Hepatitis B Surface Ag NON REACTIVE NON REACTIVE   HCV Ab NON REACTIVE NON REACTIVE    Comment: (NOTE) Nonreactive HCV antibody screen is consistent with  no HCV infections,  unless recent infection is suspected or other evidence exists to indicate HCV infection.     Hep A IgM NON REACTIVE NON REACTIVE   Hep B C IgM NON REACTIVE NON REACTIVE    Comment: Performed at [facility], [address], [place], [place] [address]  Glucose, capillary     Status: Abnormal   Collection Time: 12/16/20 10:58 AM  Result Value Ref Range   Glucose-Capillary 133 (H) 70 - 99 mg/dL    Comment: Glucose reference range applies only to samples taken after fasting for at least 8 hours.  Glucose, capillary     Status: Abnormal   Collection Time: 12/16/20  4:00 PM  Result Value Ref Range   Glucose-Capillary 218 (H) 70 - 99 mg/dL    Comment: Glucose reference range applies only to samples taken after fasting for at least 8 hours.  Glucose, capillary     Status: Abnormal   Collection Time: 12/16/20  9:09 PM  Result Value Ref Range    Glucose-Capillary 190 (H) 70 - 99 mg/dL    Comment: Glucose reference range applies only to samples taken after fasting for at least 8 hours.  Glucose, capillary     Status: Abnormal   Collection Time: 12/17/20  6:19 AM  Result Value Ref Range   Glucose-Capillary 239 (H) 70 - 99 mg/dL    Comment: Glucose reference range applies only to samples taken after fasting for at least 8 hours.  Glucose, capillary     Status: Abnormal   Collection Time: 12/17/20 11:09 AM  Result Value Ref Range   Glucose-Capillary 162 (H) 70 - 99 mg/dL    Comment: Glucose reference range applies only to samples taken after fasting for at least 8 hours.    DG Foot Complete Right  Result Date: 12/16/2020 CLINICAL DATA:  Abscess EXAM: RIGHT FOOT COMPLETE - 3+ VIEW COMPARISON:  None. FINDINGS: Alignment is anatomic. No acute fracture. Joint spaces are preserved. There is no erosive change or periosteal reaction. No evidence of soft tissue gas. No radiopaque foreign body. IMPRESSION: Negative. Electronically Signed   By: Guadlupe Spanish M.D.   On: 12/16/2020 11:56    Review of Systems  Constitutional: Negative for chills, diaphoresis and fever.  HENT: Negative for ear discharge, ear pain, hearing loss and tinnitus.   Eyes: Negative for photophobia and pain.  Respiratory: Negative for cough and shortness of breath.   Cardiovascular: Negative for chest pain.  Gastrointestinal: Negative for abdominal pain, nausea and vomiting.  Genitourinary: Negative for dysuria, flank pain, frequency and urgency.  Musculoskeletal: Positive for arthralgias (Right foot) and back pain. Negative for myalgias and neck pain.  Neurological: Negative for dizziness and headaches.  Hematological: Does not bruise/bleed easily.  Psychiatric/Behavioral: The patient is not nervous/anxious.    Blood pressure 117/90, pulse 74, temperature 98 F (36.7 C), temperature source Oral, resp. rate 17, height 5\' 6"  (1.676 m), weight 88.5 kg, SpO2 100  %. Physical Exam Constitutional:      General: He is not in acute distress.    Appearance: He is well-developed. He is not diaphoretic.  HENT:     Head: Normocephalic and atraumatic.  Eyes:     General: No scleral icterus.       Right eye: No discharge.        Left eye: No discharge.     Conjunctiva/sclera: Conjunctivae normal.  Cardiovascular:     Rate and Rhythm: Normal rate and regular rhythm.  Pulmonary:  Effort: Pulmonary effort is normal. No respiratory distress.  Musculoskeletal:     Cervical back: Normal range of motion.  Feet:     Comments: Right foot: Plantar fluctuance, mod TTP in areas, no erythema or discharge, 2+ DP, 0 PT, SPN intact, DPN paresthetis, TN nearly absent Skin:    General: Skin is warm and dry.  Neurological:     Mental Status: He is alert.  Psychiatric:        Behavior: Behavior normal.     Assessment/Plan: Right foot infection -- Will get [place] to evaluate extent of fluid collection. If superficial can probably drain bedside, else may need operative debridement. Dr. Lajoyce Corners to evaluate.    Freeman Caldron, PA-C Orthopedic Surgery [phone] 12/17/2020, 1:39 PM

## 2020-12-17 NOTE — Consult Note (Signed)
ORTHOPAEDIC CONSULTATION  REQUESTING PHYSICIAN: Joseph Art, DO  Chief Complaint: Pain plantar aspect right foot.  HPI: Jeffrey Webb is a 45 y.o. male who presents with discitis of the lumbar spine.  Patient states he has some persistent pain in the plantar aspect of the right foot he states that he no abscess was debrided in the emergency room over a month ago.  History reviewed. No pertinent past medical history. History reviewed. No pertinent surgical history. Social History   Socioeconomic History  . Marital status: Single    Spouse name: Not on file  . Number of children: Not on file  . Years of education: Not on file  . Highest education level: Not on file  Occupational History  . Not on file  Tobacco Use  . Smoking status: Current Some Day Smoker  . Smokeless tobacco: Never Used  Substance and Sexual Activity  . Alcohol use: Not Currently  . Drug use: Not Currently  . Sexual activity: Not on file  Other Topics Concern  . Not on file  Social History Narrative  . Not on file   Social Determinants of Health   Financial Resource Strain: Not on file  Food Insecurity: Not on file  Transportation Needs: Not on file  Physical Activity: Not on file  Stress: Not on file  Social Connections: Not on file   No family history on file. - negative except otherwise stated in the family history section No Known Allergies Prior to Admission medications   Medication Sig Start Date End Date Taking? Authorizing Provider  acetaminophen (TYLENOL) 500 MG tablet Take 2,000 mg by mouth every 6 (six) hours as needed for mild pain.   Yes [provider]  ibuprofen (ADVIL) 200 MG tablet Take 1,000 mg by mouth every 6 (six) hours as needed for mild pain.   Yes [provider]  Multiple Vitamin (MULTIVITAMIN) tablet Take 1 tablet by mouth daily.   Yes [provider]  metFORMIN (GLUCOPHAGE) 500 MG tablet Take 1 tablet (500 mg total) by mouth 2 (two) times  daily with a meal. Patient not taking: No sig reported 11/21/20   Mesner, Barbara Cower, MD  methocarbamol (ROBAXIN) 500 MG tablet Take 1 tablet (500 mg total) by mouth 4 (four) times daily. Patient not taking: No sig reported 11/26/20   Elson Areas, PA-C  sulfamethoxazole-trimethoprim (BACTRIM DS) 800-160 MG tablet Take 1 tablet by mouth See admin instructions. Bid x 7 days Patient not taking: No sig reported    [provider]  traMADol (ULTRAM) 50 MG tablet Take 1 tablet (50 mg total) by mouth every 6 (six) hours as needed. Patient not taking: No sig reported 11/26/20 11/26/21  Elson Areas, PA-C   DG Foot Complete Right  Result Date: 12/16/2020 CLINICAL DATA:  Abscess EXAM: RIGHT FOOT COMPLETE - 3+ VIEW COMPARISON:  None. FINDINGS: Alignment is anatomic. No acute fracture. Joint spaces are preserved. There is no erosive change or periosteal reaction. No evidence of soft tissue gas. No radiopaque foreign body. IMPRESSION: Negative. Electronically Signed   By: Guadlupe Spanish M.D.   On: 12/16/2020 11:56   - pertinent xrays, CT, MRI studies were reviewed and independently interpreted  Positive ROS: All other systems have been reviewed and were otherwise negative with the exception of those mentioned in the HPI and as above.  Physical Exam: General: Alert, no acute distress Psychiatric: Patient is competent for consent with normal mood and affect Lymphatic: No axillary or cervical lymphadenopathy Cardiovascular:  No pedal edema Respiratory: No cyanosis, no use of accessory musculature GI: No organomegaly, abdomen is soft and non-tender    Images:  @ENCIMAGES @  Labs:  Lab Results  Component Value Date   HGBA1C 14.3 (H) 12/05/2020   ESRSEDRATE 58 (H) 12/08/2020   CRP 10.6 (H) 12/08/2020   REPTSTATUS 12/11/2020 FINAL 12/06/2020   CULT  12/06/2020    NO GROWTH 5 DAYS Performed at [facility], [address], [place], [place]    [facility] STAPHYLOCOCCUS AUREUS  12/04/2020    Lab Results  Component Value Date   ALBUMIN 2.8 (L) 12/16/2020   ALBUMIN 2.7 (L) 12/15/2020   ALBUMIN 3.0 (L) 12/04/2020     CBC EXTENDED Latest Ref Rng & Units 12/15/2020 12/10/2020 12/09/2020  WBC 4.0 - 10.5 K/uL 6.3 5.8 5.3  RBC 4.22 - 5.81 MIL/uL 4.62 4.90 5.04  HGB 13.0 - 17.0 g/dL 12.2(L) 12.8(L) 13.2  HCT 39.0 - 52.0 % 38.4(L) 39.7 41.0  PLT 150 - 400 K/uL 302 305 317  NEUTROABS 1.7 - 7.7 K/uL - - -  LYMPHSABS 0.7 - 4.0 K/uL - - -    Neurologic: Patient does not have protective sensation bilateral lower extremities.   MUSCULOSKELETAL:   Skin: Examination of the right foot there is some swelling but no cellulitis no fluctuation no signs of any recurrent abscess on the plantar aspect of the right foot there is no drainage.  Has pain reproduced with palpation over the plantar fascia.  This does not appear to be consistent with a infection.  Patient has uncontrolled type 2 diabetes with a hemoglobin A1c of 14.3.    Patient is currently on IV.  Assessment: History of abscess plantar aspect of right foot with current symptomatic plantar fascia.  Plan: I will follow-up with patient after discharge.  No indication for surgical intervention at this time.  Sections  Thank you for the consult and the opportunity to see [name], MD [facility] [phone] 4:55 PM

## 2020-12-17 NOTE — Progress Notes (Signed)
Occupational Therapy Treatment Patient Details Name: Jeffrey Webb MRN: [id number] DOB: Apr 18, 1976 Today's Date: 12/17/2020    History of present illness 45 y.o. male, Presented to the ER on 12/04/2020 with back pain for over 3 weeks. MRI and CT of abdomen pelvis and spine demonstrating findings concerning with L2 discitis and/or osteomyelitis and questionable psoas abscess. PMH of type 2 diabetes, substance abuse, tobacco abuse   OT comments  Pt reports R foot feels better with shoe adaptation and has been completing his heel cord stretches. Will follow up next week to assess if any changes need to be made regarding adaptation to shoe.    Follow Up Recommendations  No OT follow up    Equipment Recommendations  None recommended by OT    Recommendations for Other Services      Precautions / Restrictions Precautions Precautions: None Required Braces or Orthoses: Other Brace (adapted shoe)       Mobility Bed Mobility                    Transfers                      Balance                                           ADL either performed or assessed with clinical judgement   ADL                                               Vision       Perception     Praxis      Cognition Arousal/Alertness: Awake/alert Behavior During Therapy: WFL for tasks assessed/performed Overall Cognitive Status: Within Functional Limits for tasks assessed                                          Exercises     Shoulder Instructions       General Comments Assessed use of foam in shoe. Pt reports he continues to have relief from foam. Reports he has been completing his heel cord stretches    Pertinent Vitals/ Pain       Pain Assessment: Faces Faces Pain Scale: Hurts little more Pain Location: R foot Pain Descriptors / Indicators: Discomfort;Grimacing Pain Intervention(s): Limited activity within patient's  tolerance  Home Living                                          Prior Functioning/Environment              Frequency  Min 1X/week        Progress Toward Goals  OT Goals(current goals can now be found in the care plan section)  Progress towards OT goals: Goals met/education completed, patient discharged from OT  Acute Rehab OT Goals Patient Stated Goal: to reduce pain in foot OT Goal Formulation: With patient Time For Goal Achievement: 12/23/20 Potential to Achieve Goals: Good ADL Goals Additional ADL Goal #1: Pt will be independent with use of R shoe  to reduce pain R foot  Plan Discharge plan remains appropriate    Co-evaluation                 AM-PAC OT "6 Clicks" Daily Activity     Outcome Measure   Help from another person eating meals?: None Help from another person taking care of personal grooming?: None Help from another person toileting, which includes using toliet, bedpan, or urinal?: None Help from another person bathing (including washing, rinsing, drying)?: None Help from another person to put on and taking off regular upper body clothing?: None Help from another person to put on and taking off regular lower body clothing?: None 6 Click Score: 24    End of Session    OT Visit Diagnosis: Other abnormalities of gait and mobility (R26.89);Pain   Activity Tolerance Patient tolerated treatment well   Patient Left in bed;with call bell/phone within reach   Nurse Communication Mobility status        Time: 0240-9735 OT Time Calculation (min): 12 min  Charges: OT General Charges $OT Visit: 1 Visit OT Treatments $Therapeutic Activity: 8-22 mins  Maurie Boettcher, OT/L   Acute OT Clinical Specialist [place] Pager [phone] Office [phone]    [facility] 12/17/2020, 1:41 PM

## 2020-12-17 NOTE — Progress Notes (Addendum)
[facility] PROGRESS NOTE  Jeffrey Webb [id number] DOB: 03-25-1976 DOA: 12/04/2020 PCP: Patient, No Pcp Per (Inactive)  Status: Remains inpatient appropriate because:Hemodynamically unstable, Unsafe d/c plan, IV treatments appropriate due to intensity of illness or inability to take PO and Inpatient level of care appropriate due to severity of illness   Dispo: The patient is from: Home              Anticipated d/c is to: Home              Patient currently is not medically stable to d/c.   Difficult to place patient No   Level of care: Med-Surg  Code Status: Full Family Communication:  DVT prophylaxis: Refused Lovenox 2/2 fear of needles-changed to apixaban COVID vaccination status: Unknown    HPI: 45 year old male with DM2, substance abuse with cocaine but no IVDU per patient, tobacco use who comes into the hospital with worsening back pain over several weeks.  He denies any trauma, injury or falls.  An MRI in the ED showed findings concerning for L2 discitis/osteomyelitis and questionable psoas abscess.  Neurosurgery, ID consulted, underwent IR guided biopsy  Subjective: Reports able to ambulate better now that right foot offloaded but continuing to have significant discomfort in right foot   Objective: Vitals:   12/16/20 2251 12/17/20 0431  BP: 118/87 127/81  Pulse: 88 82  Resp: 16 16  Temp: 98.6 F (37 C) 98.9 F (37.2 C)  SpO2: 100% 100%    Intake/Output Summary (Last 24 hours) at 12/17/2020 0856 Last data filed at 12/17/2020 0277 Gross per 24 hour  Intake 902.59 ml  Output --  Net 902.59 ml   Filed Weights   12/04/20 0800  Weight: 88.5 kg    Exam:  Constitutional: Alert, calm, uncomfortable secondary to right foot pain as well as ongoing back spasm Respiratory: Lungs are clear, stable on room air with O2 saturation 100% Cardiovascular: Normal heart sounds, regular pulse, no peripheral edema Abdomen:  LBM 6/2, soft nontender nondistended with  normoactive bowel sounds and eating well Musculoskeletal: No joint deformity upper and lower extremities. Good ROM, no contractures. Currently tender lower lumbar spine Neurologic: CN 2-12 grossly intact. Sensation intact,  Strength 5/5 x all 4 extremities.  Psychiatric: Oriented x3.  Pleasant affect  Assessment/Plan: Acute problems: MSSA bacteremia with acute discitis/osteomyelitis  -ID recommended 6 wks of IV antibiotics from date of first negative -blood cultures obtained 5/23 finally negative so will need to start 6 weeks timetable from that date (July 11) -Will also need need repeat MRI lumbar spine after antibiotics to make sure left psoas muscle phlegmon has resolved -Following ESR/CRP weekly  -IR was consulted for biopsy vs abscess drainage but IR indicated that there is no fluid collection to drain.   -TTE showed no valvular involvement,  -Continue oxycodone and Lyrica. 6/3 increased baclofen to QID -continue 5% Lidocaine cream for back pain.Marland Kitchen NSAIDs dc'd 2/2 transaminitis  Right foot pain with plantar abscess -6/3 area more swollen and fluctuant today and slightly more red and -Orthopedics/Michael Dellis Filbert, PA-C consulted for possible I/D -continue anbxs as above -XR foot w/o osteo -OT assisted with offloading foot to decrease pain with mobility -Rpt CBC w/ diff in am  Nonobstructive transaminitis -Suspect related to medications -NSAIDs/Advil discontinued -Follow labs -Hepatitis pnl negative  Type 2 diabetes mellitus, uncontrolled, with hyperglycemia -A1c 14.3.   -Continue sliding scale, appreciate diabetes coordinator.   -6/3 Increase 70/30 to 26 units for persistent hyperglycemia-CBGs remain elevated but could be  secondary to evolving plantar abscess -Add MC 4 units  Illicit substance use -UDS positive for cocaine, patient denies any intravenous drug use as he "hates needles"  Chronic diastolic CHF -2D echo showed grade 1 diastolic dysfunction, EF 72-17%.  Appears  euvolemic  Tobacco use -continue nicotine patch  Back pain due to #1, have discontinued IV morphine and increased oral oxygen    Data Reviewed: Basic Metabolic Panel: Recent Labs  Lab 12/15/20 0709 12/16/20 0703  NA 136 137  K 4.1 4.1  CL 100 100  CO2 24 27  GLUCOSE 298* 225*  BUN 14 12  CREATININE 0.80 0.95  CALCIUM 8.9 9.0   Liver Function Tests: Recent Labs  Lab 12/15/20 0709 12/16/20 0703  AST 89* 131*  ALT 59* 75*  ALKPHOS 121 125  BILITOT 0.2* 0.2*  PROT 7.0 7.3  ALBUMIN 2.7* 2.8*   No results for input(s): LIPASE, AMYLASE in the last 168 hours.   CBG: Recent Labs  Lab 12/16/20 0621 12/16/20 1058 12/16/20 1600 12/16/20 2109 12/17/20 0619  GLUCAP 238* 133* 218* 190* 239*      Active Problems:   Discitis of lumbar region   Psoas abscess, left (HCC)   Type 2 diabetes mellitus with complication, without long-term current use of insulin ([place])   Substance abuse ([place])   Pseudohyponatremia   Consultants:  ID  Procedures:  2D echocardiogram  Antibiotics:  Cefazolin   Time spent: 35 minutes    Erin Hearing ANP  [facility] 7 am - 330 pm/M-F for direct patient care and secure chat Please refer to Amion for contact info 13  days

## 2020-12-18 LAB — COMPREHENSIVE METABOLIC PANEL
ALT: 63 U/L — ABNORMAL HIGH (ref 0–44)
AST: 82 U/L — ABNORMAL HIGH (ref 15–41)
Albumin: 2.9 g/dL — ABNORMAL LOW (ref 3.5–5.0)
Alkaline Phosphatase: 124 U/L (ref 38–126)
Anion gap: 8 (ref 5–15)
BUN: 15 mg/dL (ref 6–20)
CO2: 27 mmol/L (ref 22–32)
Calcium: 9.1 mg/dL (ref 8.9–10.3)
Chloride: 103 mmol/L (ref 98–111)
Creatinine, Ser: 0.98 mg/dL (ref 0.61–1.24)
GFR, Estimated: >60 mL/min (ref >60)
Glucose, Bld: 239 mg/dL — ABNORMAL HIGH (ref 70–99)
Potassium: 3.9 mmol/L (ref 3.5–5.1)
Sodium: 138 mmol/L (ref 135–145)
Total Bilirubin: <0.1 mg/dL — ABNORMAL LOW (ref 0.3–1.2)
Total Protein: 7.4 g/dL (ref 6.5–8.1)

## 2020-12-18 LAB — CBC WITH DIFFERENTIAL/PLATELET
Abs Immature Granulocytes: 0.07 10*3/uL (ref 0.00–0.07)
Basophils Absolute: 0.1 10*3/uL (ref 0.0–0.1)
Basophils Relative: 1 %
Eosinophils Absolute: 0.1 10*3/uL (ref 0.0–0.5)
Eosinophils Relative: 2 %
HCT: 35.5 % — ABNORMAL LOW (ref 39.0–52.0)
Hemoglobin: 11.3 g/dL — ABNORMAL LOW (ref 13.0–17.0)
Immature Granulocytes: 1 %
Lymphocytes Relative: 30 %
Lymphs Abs: 2.1 10*3/uL (ref 0.7–4.0)
MCH: 26.5 pg (ref 26.0–34.0)
MCHC: 31.8 g/dL (ref 30.0–36.0)
MCV: 83.1 fL (ref 80.0–100.0)
Monocytes Absolute: 0.8 10*3/uL (ref 0.1–1.0)
Monocytes Relative: 11 %
Neutro Abs: 4.1 10*3/uL (ref 1.7–7.7)
Neutrophils Relative %: 55 %
Platelets: 289 10*3/uL (ref 150–400)
RBC: 4.27 MIL/uL (ref 4.22–5.81)
RDW: 13.4 % (ref 11.5–15.5)
WBC: 7.2 10*3/uL (ref 4.0–10.5)
nRBC: 0 % (ref 0.0–0.2)

## 2020-12-18 LAB — GLUCOSE, CAPILLARY
Glucose-Capillary: 297 mg/dL — ABNORMAL HIGH (ref 70–99)
Glucose-Capillary: 124 mg/dL — ABNORMAL HIGH (ref 70–99)
Glucose-Capillary: 264 mg/dL — ABNORMAL HIGH (ref 70–99)
Glucose-Capillary: 126 mg/dL — ABNORMAL HIGH (ref 70–99)

## 2020-12-18 NOTE — Progress Notes (Signed)
PROGRESS NOTE  Jeffrey Webb [id number] DOB: 1976-04-30 DOA: 12/04/2020 PCP: Patient, No Pcp Per (Inactive)   LOS: 14 days   Brief Narrative / Interim history: 45 year old male with DM2, substance abuse with cocaine but no IVDU per patient, tobacco use who comes into the hospital with worsening back pain over several weeks.  He denies any trauma, injury or falls.  An MRI in the ED showed findings concerning for L2 discitis/osteomyelitis and questionable psoas abscess.  Neurosurgery, ID consulted, underwent IR guided biopsy  Subjective / 24h Interval events: No complaints   Assessment & Plan: Principal Problem MSSA bacteremia with acute discitis/osteomyelitis -ID consulted, following.  Currently on cefazolin will likely need several weeks of antibiotics.  He has a history of drug use so not a good candidate for PICC line.  IR was consulted for biopsy versus abscess drainage but IR indicated that there is no fluid collection to drain.  There was concern about left psoas/deep tissue abscess but this is less likely. -TTE showed no valvular involvement, no need for TEE since treatment guidelines for discitis will cover time required for presumed endocarditis -Continue cefazolin, appreciate ID follow-up  Active Problems Type 2 diabetes mellitus, uncontrolled, with hyperglycemia-A1c 14.3.  Continue sliding scale, appreciate diabetes coordinator.  Continue 7030, CBGs controlled  CBG (last 3)  Recent Labs    12/17/20 1628 12/17/20 2105 12/18/20 0622  GLUCAP 164* 233* 297*    Illicit substance use-UDS positive for cocaine, patient denies any intravenous drug use as he "hates needles".  Still not a candidate for PICC line  Chronic diastolic CHF-2D echo showed grade 1 diastolic dysfunction, EF 55-60%.  Appears euvolemic  Tobacco use-continue nicotine patch  Back pain-continue regimen as below  Right foot pain-Ortho saw, symptoms consistent with plantar fasciitis  Scheduled Meds: .  apixaban  2.5 mg Oral BID  . baclofen  10 mg Oral QID  . hydrocerin   Topical BID  . insulin aspart  0-15 Units Subcutaneous TID WC  . insulin aspart  0-5 Units Subcutaneous QHS  . insulin aspart  4 Units Subcutaneous TID WC  . insulin aspart protamine- aspart  26 Units Subcutaneous BID WC  . multivitamin with minerals  1 tablet Oral Daily  . nicotine  14 mg Transdermal Daily  . pantoprazole  40 mg Oral Daily  . polyethylene glycol  17 g Oral BID  . pregabalin  100 mg Oral TID  . zolpidem  5 mg Oral QHS   Continuous Infusions: .  ceFAZolin (ANCEF) IV 2 g (12/18/20 0622)   PRN Meds:.acetaminophen **OR** acetaminophen, albuterol, lidocaine, ondansetron **OR** ondansetron (ZOFRAN) IV, oxyCODONE, senna-docusate  Diet Orders (From admission, onward)    Start     Ordered   12/04/20 2249  Diet Carb Modified Fluid consistency: Thin; Room service appropriate? Yes  Diet effective now       Question Answer Comment  Diet-HS Snack? Nothing   Calorie Level Medium 1600-2000   Fluid consistency: Thin   Room service appropriate? Yes      12/04/20 2249          DVT prophylaxis: apixaban (ELIQUIS) tablet 2.5 mg Start: 12/16/20 1100 SCDs Start: 12/04/20 2249     Code Status: Full Code  Family Communication: No family at bedside  Status is: Inpatient  Remains inpatient appropriate because:Inpatient level of care appropriate due to severity of illness   Dispo: The patient is from: Home              Anticipated d/c  is to: Home              Patient currently is not medically stable to d/c.   Difficult to place patient Yes  Level of care: Med-Surg  Consultants:  ID  Procedures:  2D echo  Microbiology  MSSA bacteremia  Antimicrobials: Cefazolin   Objective: Vitals:   12/17/20 1145 12/17/20 1747 12/17/20 2225 12/18/20 0459  BP: 117/90 127/85 119/76 132/81  Pulse: 74 92 87 84  Resp: 17 18 18 16   Temp: 98 F (36.7 C) 98.2 F (36.8 C) 99.8 F (37.7 C) 98.7 F (37.1 C)   TempSrc: Oral Oral Oral Oral  SpO2: 100% 98% 98% 99%  Weight:    82.1 kg  Height:        Intake/Output Summary (Last 24 hours) at 12/18/2020 1058 Last data filed at 12/18/2020 0500 Gross per 24 hour  Intake 480 ml  Output --  Net 480 ml   Filed Weights   12/04/20 0800 12/18/20 0459  Weight: 88.5 kg 82.1 kg    Examination:  Constitutional: NAD  Data Reviewed: I have independently reviewed following labs and imaging studies  CBC: Recent Labs  Lab 12/15/20 0709 12/18/20 0102  WBC 6.3 7.2  NEUTROABS  --  4.1  HGB 12.2* 11.3*  HCT 38.4* 35.5*  MCV 83.1 83.1  PLT 302 289   Basic Metabolic Panel: Recent Labs  Lab 12/15/20 0709 12/16/20 0703 12/18/20 0102  NA 136 137 138  K 4.1 4.1 3.9  CL 100 100 103  CO2 24 27 27   GLUCOSE 298* 225* 239*  BUN 14 12 15   CREATININE 0.80 0.95 0.98  CALCIUM 8.9 9.0 9.1   Liver Function Tests: Recent Labs  Lab 12/15/20 0709 12/16/20 0703 12/18/20 0102  AST 89* 131* 82*  ALT 59* 75* 63*  ALKPHOS 121 125 124  BILITOT 0.2* 0.2* <0.1*  PROT 7.0 7.3 7.4  ALBUMIN 2.7* 2.8* 2.9*   Coagulation Profile: No results for input(s): INR, PROTIME in the last 168 hours. HbA1C: No results for input(s): HGBA1C in the last 72 hours. CBG: Recent Labs  Lab 12/17/20 0619 12/17/20 1109 12/17/20 1628 12/17/20 2105 12/18/20 0622  GLUCAP 239* 162* 164* 233* 297*    No results found for this or any previous visit (from the past 240 hour(s)).   Radiology Studies: 02/16/21 RT LOWER EXTREM LTD SOFT TISSUE NON VASCULAR  Result Date: 12/18/2020 CLINICAL DATA:  Right foot plantar foot infection EXAM: ULTRASOUND RIGHT LOWER EXTREMITY LIMITED TECHNIQUE: Ultrasound examination of the lower extremity soft tissues was performed in the area of clinical concern. COMPARISON:  None. FINDINGS: Joint Space: Not applicable Muscles: Not applicable Tendons: Not applicable Other Soft Tissue Structures: Limited grayscale and color Doppler sonography in the area of  previous incision and drainage demonstrates a focal area of subcutaneous interstitial edema without discrete loculated fluid collection identified. There is mild hyperemia in this region with mild thickening of the overlying skin. IMPRESSION: Focal interstitial edema in the area of prior surgical incision without discrete drainable fluid collection identified. Electronically Signed   By: 02/17/21 MD   On: 12/18/2020 00:33    02/17/2021, MD, PhD [facility]  Between 7 am - 7 pm I am available, please contact me via Amion (for emergencies) or Securechat (non urgent messages)  Between 7 pm - 7 am I am not available, please contact night coverage MD/APP via Amion

## 2020-12-19 LAB — GLUCOSE, CAPILLARY
Glucose-Capillary: 245 mg/dL — ABNORMAL HIGH (ref 70–99)
Glucose-Capillary: 113 mg/dL — ABNORMAL HIGH (ref 70–99)
Glucose-Capillary: 115 mg/dL — ABNORMAL HIGH (ref 70–99)

## 2020-12-19 MED ORDER — OXYCODONE HCL 5 MG PO TABS
10.0000 mg | ORAL_TABLET | Freq: Once | ORAL | Status: AC
  Administered 2020-12-19: 10 mg via ORAL
  Filled 2020-12-19: qty 2

## 2020-12-19 NOTE — Progress Notes (Signed)
PROGRESS NOTE  Jeffrey Webb QMV:[id number] DOB: 05-24-1976 DOA: 12/04/2020 PCP: Patient, No Pcp Per (Inactive)   LOS: 15 days   Brief Narrative / Interim history: 45 year old male with DM2, substance abuse with cocaine but no IVDU per patient, tobacco use who comes into the hospital with worsening back pain over several weeks.  He denies any trauma, injury or falls.  An MRI in the ED showed findings concerning for L2 discitis/osteomyelitis and questionable psoas abscess.  Neurosurgery, ID consulted, underwent IR guided biopsy  Subjective / 24h Interval events: Complains of back pain  Assessment & Plan: Principal Problem MSSA bacteremia with acute discitis/osteomyelitis -ID consulted, following.  Currently on cefazolin will likely need several weeks of antibiotics.  He has a history of drug use so not a good candidate for PICC line.  IR was consulted for biopsy versus abscess drainage but IR indicated that there is no fluid collection to drain.  There was concern about left psoas/deep tissue abscess but this is less likely. -TTE showed no valvular involvement, no need for TEE since treatment guidelines for discitis will cover time required for presumed endocarditis -Continue cefazolin, appreciate ID follow-up  Active Problems Type 2 diabetes mellitus, uncontrolled, with hyperglycemia-A1c 14.3.  Continue sliding scale, appreciate diabetes coordinator.  Continue 7030, CBGs controlled  CBG (last 3)  Recent Labs    12/18/20 1602 12/18/20 2122 12/19/20 0608  GLUCAP 264* 126* 245*    Illicit substance use-UDS positive for cocaine, patient denies any intravenous drug use as he "hates needles".  Still not a candidate for PICC line  Chronic diastolic CHF-2D echo showed grade 1 diastolic dysfunction, EF 55-60%.  Appears euvolemic  Tobacco use-continue nicotine patch  Back pain-continue regimen as below  Right foot pain-Ortho saw, symptoms consistent with plantar fasciitis  Scheduled  Meds: . apixaban  2.5 mg Oral BID  . baclofen  10 mg Oral QID  . hydrocerin   Topical BID  . insulin aspart  0-15 Units Subcutaneous TID WC  . insulin aspart  0-5 Units Subcutaneous QHS  . insulin aspart  4 Units Subcutaneous TID WC  . insulin aspart protamine- aspart  26 Units Subcutaneous BID WC  . multivitamin with minerals  1 tablet Oral Daily  . nicotine  14 mg Transdermal Daily  . pantoprazole  40 mg Oral Daily  . polyethylene glycol  17 g Oral BID  . pregabalin  100 mg Oral TID  . zolpidem  5 mg Oral QHS   Continuous Infusions: .  ceFAZolin (ANCEF) IV 2 g (12/19/20 0614)   PRN Meds:.acetaminophen **OR** acetaminophen, albuterol, lidocaine, ondansetron **OR** ondansetron (ZOFRAN) IV, oxyCODONE, senna-docusate  Diet Orders (From admission, onward)    Start     Ordered   12/04/20 2249  Diet Carb Modified Fluid consistency: Thin; Room service appropriate? Yes  Diet effective now       Question Answer Comment  Diet-HS Snack? Nothing   Calorie Level Medium 1600-2000   Fluid consistency: Thin   Room service appropriate? Yes      12/04/20 2249          DVT prophylaxis: apixaban (ELIQUIS) tablet 2.5 mg Start: 12/16/20 1100 SCDs Start: 12/04/20 2249     Code Status: Full Code  Family Communication: No family at bedside  Status is: Inpatient  Remains inpatient appropriate because:Inpatient level of care appropriate due to severity of illness   Dispo: The patient is from: Home              Anticipated  d/c is to: Home              Patient currently is not medically stable to d/c.   Difficult to place patient Yes  Level of care: Med-Surg  Consultants:  ID  Procedures:  2D echo  Microbiology  MSSA bacteremia  Antimicrobials: Cefazolin   Objective: Vitals:   12/18/20 1207 12/18/20 1815 12/18/20 2347 12/19/20 0610  BP: 112/78 117/87 117/71 114/71  Pulse: 88 97 84 74  Resp: 16 16 17 17   Temp: 98.8 F (37.1 C) 98.7 F (37.1 C) 98.6 F (37 C) 98.6 F  (37 C)  TempSrc: Oral Oral Oral Oral  SpO2: 98% 98% 100% 99%  Weight:      Height:        Intake/Output Summary (Last 24 hours) at 12/19/2020 0947 Last data filed at 12/18/2020 2040 Gross per 24 hour  Intake 480 ml  Output --  Net 480 ml   Filed Weights   12/04/20 0800 12/18/20 0459  Weight: 88.5 kg 82.1 kg    Examination:  Constitutional: no distress  Data Reviewed: I have independently reviewed following labs and imaging studies  CBC: Recent Labs  Lab 12/15/20 0709 12/18/20 0102  WBC 6.3 7.2  NEUTROABS  --  4.1  HGB 12.2* 11.3*  HCT 38.4* 35.5*  MCV 83.1 83.1  PLT 302 289   Basic Metabolic Panel: Recent Labs  Lab 12/15/20 0709 12/16/20 0703 12/18/20 0102  NA 136 137 138  K 4.1 4.1 3.9  CL 100 100 103  CO2 24 27 27   GLUCOSE 298* 225* 239*  BUN 14 12 15   CREATININE 0.80 0.95 0.98  CALCIUM 8.9 9.0 9.1   Liver Function Tests: Recent Labs  Lab 12/15/20 0709 12/16/20 0703 12/18/20 0102  AST 89* 131* 82*  ALT 59* 75* 63*  ALKPHOS 121 125 124  BILITOT 0.2* 0.2* <0.1*  PROT 7.0 7.3 7.4  ALBUMIN 2.7* 2.8* 2.9*   Coagulation Profile: No results for input(s): INR, PROTIME in the last 168 hours. HbA1C: No results for input(s): HGBA1C in the last 72 hours. CBG: Recent Labs  Lab 12/18/20 0622 12/18/20 1102 12/18/20 1602 12/18/20 2122 12/19/20 0608  GLUCAP 297* 124* 264* 126* 245*    No results found for this or any previous visit (from the past 240 hour(s)).   Radiology Studies: No results found.  02/17/21, MD, PhD [facility]  Between 7 am - 7 pm I am available, please contact me via Amion (for emergencies) or Securechat (non urgent messages)  Between 7 pm - 7 am I am not available, please contact night coverage MD/APP via Amion

## 2020-12-20 LAB — GLUCOSE, CAPILLARY
Glucose-Capillary: 166 mg/dL — ABNORMAL HIGH (ref 70–99)
Glucose-Capillary: 307 mg/dL — ABNORMAL HIGH (ref 70–99)
Glucose-Capillary: 107 mg/dL — ABNORMAL HIGH (ref 70–99)
Glucose-Capillary: 265 mg/dL — ABNORMAL HIGH (ref 70–99)
Glucose-Capillary: 139 mg/dL — ABNORMAL HIGH (ref 70–99)

## 2020-12-20 MED ORDER — PREDNISONE 20 MG PO TABS
20.0000 mg | ORAL_TABLET | Freq: Every day | ORAL | Status: DC
  Administered 2020-12-21 – 2020-12-23 (×3): 20 mg via ORAL
  Filled 2020-12-20 (×3): qty 1

## 2020-12-20 MED ORDER — ZOLPIDEM TARTRATE 5 MG PO TABS
10.0000 mg | ORAL_TABLET | Freq: Every day | ORAL | Status: DC
  Administered 2020-12-20 – 2020-12-22 (×3): 10 mg via ORAL
  Filled 2020-12-20 (×3): qty 2

## 2020-12-20 MED ORDER — OXYCODONE HCL 5 MG PO TABS
15.0000 mg | ORAL_TABLET | ORAL | Status: DC | PRN
  Administered 2020-12-20 – 2020-12-23 (×10): 15 mg via ORAL
  Filled 2020-12-20 (×10): qty 3

## 2020-12-20 NOTE — Progress Notes (Addendum)
[facility] PROGRESS NOTE  Jeffrey Webb QIW:[id number] DOB: Dec 03, 1975 DOA: 12/04/2020 PCP: Patient, No Pcp Per (Inactive)  Status: Remains inpatient appropriate because:Hemodynamically unstable, Unsafe d/c plan, IV treatments appropriate due to intensity of illness or inability to take PO and Inpatient level of care appropriate due to severity of illness   Dispo: The patient is from: Home              Anticipated d/c is to: Home              Patient currently is not medically stable to d/c.   Difficult to place patient No   Level of care: Med-Surg  Code Status: Full Family Communication:  DVT prophylaxis: Refused Lovenox 2/2 fear of needles-changed to apixaban COVID vaccination status: Unknown    HPI: 45 year old male with DM2, substance abuse with cocaine but no IVDU per patient, tobacco use who comes into the hospital with worsening back pain over several weeks.  He denies any trauma, injury or falls.  An MRI in the ED showed findings concerning for L2 discitis/osteomyelitis and questionable psoas abscess.  Neurosurgery, ID consulted, underwent IR guided biopsy  Subjective: Alert.  Continues to have back pain -also complaining reporting right toe swelling and continued tenderness right plantar surface  Objective: Vitals:   12/19/20 2318 12/20/20 0559  BP: 122/84 125/79  Pulse: 93 87  Resp: 16 17  Temp: 98.1 F (36.7 C) 98 F (36.7 C)  SpO2: 99% 98%    Intake/Output Summary (Last 24 hours) at 12/20/2020 0837 Last data filed at 12/20/2020 0300 Gross per 24 hour  Intake 700 ml  Output --  Net 700 ml   Filed Weights   12/04/20 0800 12/18/20 0459  Weight: 88.5 kg 82.1 kg    Exam:  Constitutional: Alert, calm, mildly uncomfortable secondary to ongoing back pain with spasm Respiratory: Lungs are clear, stable on room air Cardiovascular: S1-S2, no peripheral edema, normotensive Abdomen:  LBM 6/4, soft nontender with normal active bowel sounds.  Eating  well. Musculoskeletal: No joint deformity upper and lower extremities. Good ROM, no contractures. Remains tender lower lumbar spine.  Continues to have swelling on plantar surface of right foot with some great toe edema Neurologic: CN 2-12 grossly intact. Sensation intact,  Strength 5/5 x all 4 extremities.  Psychiatric: Alert and oriented x3  Assessment/Plan: Acute problems: MSSA bacteremia with acute discitis/osteomyelitis  -ID recommended 6 wks of IV antibiotics from date of first negative -blood cultures obtained 5/23 finally negative so will need to start 6 weeks timetable from that date (July 11) -Will also need need repeat MRI lumbar spine after antibiotics to make sure left psoas muscle phlegmon resolved -Following ESR/CRP weekly  -IR indicated that there is no fluid collection to drain.   -TTE showed no valvular involvement,  -Increase oxycodone 15 mg prn and continue Lyrica. 6/3 increased baclofen to QID -continue 5% Lidocaine cream for back pain.Marland Kitchen NSAIDs dc'd 2/2 transaminitis  Right foot pain and swelling due to acute Planter fasciitis -6/3 evaluated by Dr. Sharol Given who felt symptoms were not consistent with abscess but were consistent with Planter fasciitis -OT assisted with offloading foot to decrease pain with mobility -Continues to have swelling and discomfort so will initiate low-dose prednisone x5 days.  Nonobstructive transaminitis -Suspect related to medications -NSAIDs/Advil discontinued -AST/ALT slightly trending downward.  TB remains normal -Hepatitis pnl negative  Type 2 diabetes mellitus, uncontrolled, with hyperglycemia -A1c 14.3.   -Continue sliding scale, appreciate diabetes coordinator.   - continue 70/30  insulin 26 units and MC -Patient reports that he was newly diagnosed prior to this admission.  -Never obtained metformin prescribed because it was too expensive. -I requested additional diabetes educator consultation to assist with teaching.  Patient will need  to have a glucometer and strips prescribed at discharge. -He will need to follow-up with [facility] to establish with PCP after discharge since he has no insurance and no way to pay out-of-pocket for physician visits.  Discharge medications will be obtained through [facility].  Illicit substance use -UDS positive for cocaine, patient denies any intravenous drug use as he "hates needles"  Chronic diastolic CHF -2D echo showed grade 1 diastolic dysfunction, EF 02-21%.  Appears euvolemic  Tobacco use -continue nicotine patch  Back pain due to #1, have discontinued IV morphine and increased oral oxygen    Data Reviewed: Basic Metabolic Panel: Recent Labs  Lab 12/15/20 0709 12/16/20 0703 12/18/20 0102  NA 136 137 138  K 4.1 4.1 3.9  CL 100 100 103  CO2 24 27 27   GLUCOSE 298* 225* 239*  BUN 14 12 15   CREATININE 0.80 0.95 0.98  CALCIUM 8.9 9.0 9.1   Liver Function Tests: Recent Labs  Lab 12/15/20 0709 12/16/20 0703 12/18/20 0102  AST 89* 131* 82*  ALT 59* 75* 63*  ALKPHOS 121 125 124  BILITOT 0.2* 0.2* <0.1*  PROT 7.0 7.3 7.4  ALBUMIN 2.7* 2.8* 2.9*   No results for input(s): LIPASE, AMYLASE in the last 168 hours.   CBG: Recent Labs  Lab 12/18/20 2122 12/19/20 0608 12/19/20 1043 12/19/20 1608 12/20/20 0558  GLUCAP 126* 245* 113* 115* 307*      Active Problems:   Discitis of lumbar region   Psoas abscess, left (HCC)   Type 2 diabetes mellitus with complication, without long-term current use of insulin (HCC)   Substance abuse (HCC)   Pseudohyponatremia   Abscess of plantar aspect of foot   Plantar fasciitis, right   Foot infection   Consultants:  ID  Procedures:  2D echocardiogram  Antibiotics:  Cefazolin   Time spent: 35 minutes    Erin Hearing ANP  [facility] 7 am - 330 pm/M-F for direct patient care and secure chat Please refer to Amion for contact info 16  days
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
## 2020-12-20 NOTE — Progress Notes (Addendum)
Inpatient Diabetes Program Recommendations  AACE/ADA: New Consensus Statement on Inpatient Glycemic Control   Target Ranges:  Prepandial:   less than 140 mg/dL      Peak postprandial:   less than 180 mg/dL (1-2 hours)      Critically ill patients:  140 - 180 mg/dL   Results for Jeffrey Webb, Jeffrey Webb (MRN [id number]) as of 12/20/2020 09:26  Ref. Range 12/19/2020 06:08 12/19/2020 10:43 12/19/2020 16:08 12/20/2020 05:58  Glucose-Capillary Latest Ref Range: 70 - 99 mg/dL 245 (H) 113 (H) 115 (H) 307 (H)  Results for Jeffrey Webb, Jeffrey Webb (MRN [id number]) as of 12/20/2020 09:26  Ref. Range 12/05/2020 03:53  Hemoglobin A1C Latest Ref Range: 4.8 - 5.6 % 14.3 (H)   Review of Glycemic Control  Diabetes history:DM2 Outpatient Diabetes medications:Metformin 500 mg BID (not taking) Current orders for Inpatient glycemic control: 70/30 26 units BID, Novolog 0-15 units TID with meals, Novolog 0-5 units QHS, Novolog 4 units TID with meals; Prednisone 20 mg daily  Inpatient Diabetes Program Recommendations:  HbgA1C: A1C 14.3% on 5/22/22indicating an average glucose of 364 mg/dl over the past 2-3 months.Patient will need to be discharged on insulin but is uninsured so will need affordable insulin such as Novolin 70/30.  NOTE: Noted consult for diabetes coordinator.  Inpatient diabetes coordinator spoke with patient and fiance on 12/07/20 regarding DM and insulin. TOC is following for discharge needs.   Addendum 12/20/20@15 :20-Spoke with patient regarding DM and insulin. Patient states that he was recently prescribed Metformin but did not get it filled because it was expensive. Discussed that Metformin is on the $4 list at [facility] which may be the lowest price. Discussed basics of DM and stressed importance of getting DM under better control. Discussed A1C of 14.3% on 12/05/20. Discussed A1C and glucose goals. Discussed hyperglycemia, hypoglycemia, signs and symptoms of both, and treatment for both. Asked patient to start monitoring  glucose at home and be sure to take glucometer with him to follow up appointments.  Patient states that his daughter has DM and takes insulin so he is somewhat familiar with insulin. Discussed insulin injections using vial/syringe and insulin pens and patient states he would prefer to use insulin pens. Patient notes that he is afraid of needles and has not gotten up the courage to self administer insulin to himself yet while inpatient. Patient states that he will have someone with him at home that can help with insulin injections until he is able to self administer. Reviewed and demonstrated how to use vial/syringe and insulin pens. Patient states he would prefer insulin pens and he was able to demonstrate how to use an insulin pen. Patient has insulin pen starter kit at bedside; asked to review and use it at home as a reminder of steps if needed. Diabetes coordinator that seen patient on 12/07/20 educated patient's fiance on the insulin pen. Patient states that he has no insurance or PCP. Patient will need assistance with follow up and medication needs. TOC consulted for assistance with follow up and medications. Patient verbalized understanding of information and states that he has no questions at this time related to DM.   Thanks, Barnie Alderman, RN, MSN, CDE Diabetes Coordinator Inpatient Diabetes Program [phone] (Team Pager from 8am to 5pm)
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
## 2020-12-20 NOTE — Progress Notes (Addendum)
CSW spoke with patient at bedside to discuss Medicaid and Disability applications. Patient states he previously received a phone call from a financial counseling but that he thought it was a scam so he hung up - CSW encouraged patient to engage with a  financial counselor to address his need for insurance coverage. CSW discussed patient's attempt to leave last night, patient states he was not leaving that he just needed to "cool off." Patient's updated cell phone number is [phone].  CSW will contact financial counseling to request they assist this patient.  Edwin Dada, MSW, LCSW Transitions of Care  Clinical Social Worker II [phone]
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
## 2020-12-21 LAB — GLUCOSE, CAPILLARY
Glucose-Capillary: 158 mg/dL — ABNORMAL HIGH (ref 70–99)
Glucose-Capillary: 174 mg/dL — ABNORMAL HIGH (ref 70–99)
Glucose-Capillary: 253 mg/dL — ABNORMAL HIGH (ref 70–99)
Glucose-Capillary: 102 mg/dL — ABNORMAL HIGH (ref 70–99)

## 2020-12-21 MED ORDER — METFORMIN HCL 500 MG PO TABS
500.0000 mg | ORAL_TABLET | Freq: Two times a day (BID) | ORAL | Status: DC
  Administered 2020-12-21 – 2020-12-22 (×3): 500 mg via ORAL
  Filled 2020-12-21 (×3): qty 1

## 2020-12-21 NOTE — Progress Notes (Signed)
Orthopedic Tech Progress Note Patient Details:  Jeffrey Webb April 25, 1976 [id number]  Ortho Devices Type of Ortho Device: CAM walker Ortho Device/Splint Location: RLE Ortho Device/Splint Interventions: Ordered,Application,Adjustment   Post Interventions Patient Tolerated: Well Instructions Provided: Care of device   [name] 12/21/2020, 4:22 PM

## 2020-12-21 NOTE — Progress Notes (Signed)
Physical Therapy Treatment Patient Details Name: Jeffrey Webb MRN: [id number] DOB: [birth year] Today's Date: 12/21/2020    History of Present Illness 45 y.o. male, Presented to the ER on 12/04/2020 with back pain for over 3 weeks. MRI and CT of abdomen pelvis and spine demonstrating findings concerning with L2 discitis and/or osteomyelitis and questionable psoas abscess. PMH of type 2 diabetes, substance abuse, tobacco abuse    PT Comments    Pt appreciative of therapy coming to work on exercise with him. Per notes R foot does not have abscess and is likely plantar fascitis. OT has provided foam padding to provide support. PT also taught Achilles tendon stretches prior to ambulation. Pt reports reduction in pain with ambulation. Able to ambulate >1000 feet with supervision and perform UE and LE exercises. PT will continue to follow weekly.    Follow Up Recommendations  No PT follow up;Supervision - Intermittent        Recommendations for Other Services OT consult     Precautions / Restrictions Precautions Precautions: Fall Restrictions Weight Bearing Restrictions: No    Mobility  Bed Mobility Overal bed mobility: Independent             General bed mobility comments: pt getting out of bed when therapist entered room, had pt return to bed for achilles stretch to decreased pain    Transfers Overall transfer level: Modified independent               General transfer comment: use of bed rail and elevated surface to come to standing  Ambulation/Gait Ambulation/Gait assistance: Supervision Gait Distance (Feet): 100 Feet Assistive device: None Gait Pattern/deviations: Antalgic;Step-through pattern;Decreased weight shift to right;Decreased stance time - right Gait velocity: slow Gait velocity interpretation: 1.31 - 2.62 ft/sec, indicative of limited community ambulator General Gait Details: supervision for safety, starts out with strong steady gait, reports stretching  has helped with pain however gait velocity decreases with distance.      Balance     Sitting balance-[name]: Normal       Standing balance-[name]: Normal                              Cognition Arousal/Alertness: Awake/alert Behavior During Therapy: WFL for tasks assessed/performed Overall Cognitive Status: Within Functional Limits for tasks assessed                                 General Comments: continued minimization of his medical condition      Exercises Other Exercises Other Exercises: wall push ups (pectorial )x 10 Other Exercises: wall push up (tricep) x10 Other Exercises: squats x 20 Other Exercises: sheet assisted Achilles stretch 5x 10 sec prior to ambulation    General Comments General comments (skin integrity, edema, etc.): Pt has switched from foam in Croc shoe to use of mores supportive sneaker,      Pertinent Vitals/Pain Pain Assessment: Faces Faces Pain Scale: Hurts a little bit Pain Location: R foot pain Pain Descriptors / Indicators: Throbbing;Aching Pain Intervention(s): Limited activity within patient's tolerance;Monitored during session;Repositioned;Other (comment) (streching prior to ambulation)           PT Goals (current goals can now be found in the care plan section) Acute Rehab PT Goals Patient Stated Goal: have less pain PT Goal Formulation: With patient Time For Goal Achievement: 12/22/20 Potential to Achieve Goals: Fair Progress towards  PT goals: Progressing toward goals    Frequency    Min 1X/week      PT Plan Current plan remains appropriate       AM-PAC PT "6 Clicks" Mobility   Outcome Measure  Help needed turning from your back to your side while in a flat bed without using bedrails?: None Help needed moving from lying on your back to sitting on the side of a flat bed without using bedrails?: None Help needed moving to and from a bed to a chair (including a wheelchair)?:  None Help needed standing up from a chair using your arms (e.g., wheelchair or bedside chair)?: None Help needed to walk in hospital room?: None Help needed climbing 3-5 steps with a railing? : A Lot 6 Click Score: 22    End of Session Equipment Utilized During Treatment: Gait belt Activity Tolerance: Patient tolerated treatment well Patient left: in chair;with call bell/phone within reach Nurse Communication: Mobility status PT Visit Diagnosis: Other abnormalities of gait and mobility (R26.89);Muscle weakness (generalized) (M62.81);Difficulty in walking, not elsewhere classified (R26.2);Pain Pain - Right/Left: Right Pain - part of body: Ankle and joints of foot     Time: 9211-9417 PT Time Calculation (min) (ACUTE ONLY): 26 min  Charges:  $Therapeutic Exercise: 8-22 mins                     Jeffrey Webb B. Jeffrey Webb PT, DPT Acute Rehabilitation Services Pager [phone] Office [phone]    Jeffrey Webb 12/21/2020, 11:50 AM

## 2020-12-21 NOTE — Progress Notes (Signed)
[facility] PROGRESS NOTE  Jeffrey Webb OJJ:[id number] DOB: 1975/08/13 DOA: 12/04/2020 PCP: Patient, No Pcp Per (Inactive)  Status: Remains inpatient appropriate because:Hemodynamically unstable, Unsafe d/c plan, IV treatments appropriate due to intensity of illness or inability to take PO and Inpatient level of care appropriate due to severity of illness   Dispo: The patient is from: Home              Anticipated d/c is to: Home              Patient currently is not medically stable to d/c.   Difficult to place patient No   Level of care: Med-Surg  Code Status: Full Family Communication:  DVT prophylaxis: Refused Lovenox 2/2 fear of needles-changed to apixaban COVID vaccination status: Unknown    HPI: 45 year old male with DM2, substance abuse with cocaine but no IVDU per patient, tobacco use who comes into the hospital with worsening back pain over several weeks.  He denies any trauma, injury or falls.  An MRI in the ED showed findings concerning for L2 discitis/osteomyelitis and questionable psoas abscess.  Neurosurgery, ID consulted, underwent IR guided biopsy  Subjective: Continues to report swelling in toe of right foot as well as some pain with application of shoe.  No other pain or symptoms reported  Objective: Vitals:   12/20/20 2228 12/21/20 0505  BP: 123/76 124/81  Pulse: 73 72  Resp: 18 16  Temp: 98.5 F (36.9 C) 99 F (37.2 C)  SpO2: 99% 100%    Intake/Output Summary (Last 24 hours) at 12/21/2020 0812 Last data filed at 12/21/2020 0650 Gross per 24 hour  Intake 503.96 ml  Output --  Net 503.96 ml   Filed Weights   12/04/20 0800 12/18/20 0459  Weight: 88.5 kg 82.1 kg    Exam:  Constitutional: Calm and in no acute distress Respiratory: Posterior lung sounds are clear to auscultation.  Room air.  O2 saturations between 98 and 100% Cardiovascular: S1-S2, no JVD, subtle right plantar surface swelling as well as right great toe swelling, normotensive  and pulse regular nontachycardic Abdomen:  LBM 6/4, soft and nontender.  Eating well. Musculoskeletal: No reports today of back spasms.  Continues to have pain in right foot secondary to plantar fasciitis Neurologic: CN 2-12 grossly intact. Sensation intact,  Strength 5/5 x all 4 extremities.  Psychiatric: Alert and oriented x3.  Assessment/Plan: Acute problems: MSSA bacteremia with acute discitis/osteomyelitis  -ID recommended 6 wks of IV antibiotics from date of first negative -blood cultures obtained 5/23 finally negative so will need to start 6 weeks timetable from that date (July 11) -Will also need need repeat MRI lumbar spine after antibiotics to make sure left psoas muscle phlegmon resolved -Following ESR/CRP weekly  -IR indicated that there is no fluid collection to drain.   -TTE showed no valvular involvement -Continue oxycodone,Lyrica and baclofen --continue 5% Lidocaine cream for back pain.Marland Kitchen NSAIDs dc'd 2/2 transaminitis  Right foot pain and swelling due to acute Planter fasciitis -6/3 evaluated by Dr. Sharol Given who felt symptoms were not consistent with abscess but were consistent with Planter fasciitis -OT assisted with offloading foot to decrease pain with mobility -Continues to have swelling and discomfort so will initiate low-dose prednisone x5 days.  -We will see if OT has a splint for Planter fasciitis otherwise we will apply Ace wrap over center of foot  Nonobstructive transaminitis -Suspect related to medications -NSAIDs/Advil discontinued -AST/ALT slightly trending downward.  TB remains normal -Hepatitis pnl negative  Type 2  diabetes mellitus, uncontrolled, with hyperglycemia -A1c 14.3.   -Continue sliding scale, appreciate diabetes coordinator.   - continue 70/30 insulin 26 units and MC -Patient reports that he was newly diagnosed prior to this admission.  -Never obtained metformin prescribed because it was too expensive. -I requested additional diabetes educator  consultation to assist with teaching.  Patient will need to have a glucometer and strips prescribed at discharge. -6/7 Add back previously prescribed Metformin but patient never started prior to admission -He will need to follow-up with [facility] to establish with PCP after discharge since he has no insurance and no way to pay out-of-pocket for physician visits.  Discharge medications will be obtained through [facility].  Illicit substance use -UDS positive for cocaine, patient denies any intravenous drug use as he "hates needles"  Chronic diastolic CHF -2D echo showed grade 1 diastolic dysfunction, EF 69-67%.  Appears euvolemic  Tobacco use -continue nicotine patch  Back pain due to #1, have discontinued IV morphine and increased oral oxygen    Data Reviewed: Basic Metabolic Panel: Recent Labs  Lab 12/15/20 0709 12/16/20 0703 12/18/20 0102  NA 136 137 138  K 4.1 4.1 3.9  CL 100 100 103  CO2 _0 GLUCOSE 298* 225* 239*  BUN _1 CREATININE 0.80 0.95 0.98  CALCIUM 8.9 9.0 9.1   Liver Function Tests: Recent Labs  Lab 12/15/20 0709 12/16/20 0703 12/18/20 0102  AST 89* 131* 82*  ALT 59* 75* 63*  ALKPHOS 121 125 124  BILITOT 0.2* 0.2* <0.1*  PROT 7.0 7.3 7.4  ALBUMIN 2.7* 2.8* 2.9*   No results for input(s): LIPASE, AMYLASE in the last 168 hours.   CBG: Recent Labs  Lab 12/20/20 0558 12/20/20 1108 12/20/20 1609 12/20/20 2110 12/21/20 0625  GLUCAP 307* 107* 265* 139* 158*      Active Problems:   Discitis of lumbar region   Psoas abscess, left (HCC)   Type 2 diabetes mellitus with complication, without long-term current use of insulin (HCC)   Substance abuse (HCC)   Pseudohyponatremia   Abscess of plantar aspect of foot   Plantar fasciitis, right   Foot infection   Consultants:  ID  Procedures:  2D echocardiogram  Antibiotics:  Cefazolin   Time spent: 35 minutes    Erin Hearing  ANP  [facility] 7 am - 330 pm/M-F for direct patient care and secure chat Please refer to Amion for contact info 17  days

## 2020-12-21 NOTE — TOC Progression Note (Addendum)
Transition of Care [facility]) - Progression Note    Patient Details  Name: Jeffrey Webb MRN: [id number] Date of Birth: 1975-08-18  Transition of Care [facility]) CM/SW Contact  Janae Bridgeman, RN Phone Number: 12/21/2020, 8:42 AM  Clinical Narrative:    CM noted request for medication assistance for medication assistance.  The patient will remain hospitalized for IV antibiotics treatment.  The patient does not have insurance coverage and will have medications sent through [facility] pharmacy for medication assistance upon discharge home from the hospital - to include Insulin.  Pending discharge is noted for date of 01/17/2021.   Expected Discharge Plan: Home/Self Care Barriers to Discharge: Continued Medical Work up (Patient will need IV antibiotics in the hospital prior to discharge to home)  Expected Discharge Plan and Services Expected Discharge Plan: Home/Self Care In-house Referral: Clinical Social Work,Financial Counselor Discharge Planning Services: CM Consult,Indigent Health Clinic,Medication Assistance,Follow-up appt scheduled Post Acute Care Choice: Durable Medical Equipment Living arrangements for the past 2 months: Single Family Home (LIves with girlfriend at [address] [place], [place])                 DME Arranged: Dan Humphreys rolling DME Agency: [employer]                   Social Determinants of Health (SDOH) Interventions    Readmission Risk Interventions No flowsheet data found.

## 2020-12-21 NOTE — Progress Notes (Signed)
OT Note  Received order for splint. Discussed with Dr Lajoyce Corners. Placed order for CAM boot to be worn when sleeping as tolerated and during ambulation to decrease pain. Will follow up tomorrow.   Luisa Dago, OT/L   Acute OT Clinical Specialist Acute Rehabilitation Services Pager [phone] Office [phone]

## 2020-12-22 ENCOUNTER — Inpatient Hospital Stay (HOSPITAL_COMMUNITY): Payer: Self-pay

## 2020-12-22 DIAGNOSIS — R7881 Bacteremia: Secondary | ICD-10-CM

## 2020-12-22 DIAGNOSIS — K5900 Constipation, unspecified: Secondary | ICD-10-CM

## 2020-12-22 DIAGNOSIS — B9561 Methicillin susceptible Staphylococcus aureus infection as the cause of diseases classified elsewhere: Secondary | ICD-10-CM

## 2020-12-22 DIAGNOSIS — K5901 Slow transit constipation: Secondary | ICD-10-CM

## 2020-12-22 LAB — BASIC METABOLIC PANEL
Anion gap: 10 (ref 5–15)
BUN: 16 mg/dL (ref 6–20)
CO2: 27 mmol/L (ref 22–32)
Calcium: 8.8 mg/dL — ABNORMAL LOW (ref 8.9–10.3)
Chloride: 101 mmol/L (ref 98–111)
Creatinine, Ser: 0.97 mg/dL (ref 0.61–1.24)
GFR, Estimated: >60 mL/min (ref >60)
Glucose, Bld: 253 mg/dL — ABNORMAL HIGH (ref 70–99)
Potassium: 3.5 mmol/L (ref 3.5–5.1)
Sodium: 138 mmol/L (ref 135–145)

## 2020-12-22 LAB — GLUCOSE, CAPILLARY
Glucose-Capillary: 265 mg/dL — ABNORMAL HIGH (ref 70–99)
Glucose-Capillary: 234 mg/dL — ABNORMAL HIGH (ref 70–99)
Glucose-Capillary: 201 mg/dL — ABNORMAL HIGH (ref 70–99)
Glucose-Capillary: 153 mg/dL — ABNORMAL HIGH (ref 70–99)

## 2020-12-22 MED ORDER — MAGNESIUM OXIDE -MG SUPPLEMENT 400 (240 MG) MG PO TABS
400.0000 mg | ORAL_TABLET | ORAL | Status: DC | PRN
  Administered 2020-12-22: 400 mg via ORAL
  Filled 2020-12-22: qty 1

## 2020-12-22 MED ORDER — SENNOSIDES-DOCUSATE SODIUM 8.6-50 MG PO TABS: 2.0000 | ORAL_TABLET | Freq: Two times a day (BID) | ORAL | Status: DC | PRN

## 2020-12-22 MED ORDER — SORBITOL 70 % SOLN
30.0000 mL | Status: AC
  Administered 2020-12-22: 30 mL via ORAL
  Filled 2020-12-22: qty 30

## 2020-12-22 MED ORDER — INSULIN ASPART PROT & ASPART (70-30 MIX) 100 UNIT/ML ~~LOC~~ SUSP
30.0000 [IU] | Freq: Two times a day (BID) | SUBCUTANEOUS | Status: DC
  Administered 2020-12-22 – 2020-12-23 (×3): 30 [IU] via SUBCUTANEOUS

## 2020-12-22 NOTE — Progress Notes (Signed)
IVT consulted for PIV placement.  Upon arrival, pt and support in a verbal dispute.  Tech and RN notified unsafe for IVT to enter room.  Security and [facility] called.  RN states to not enter room.  RN to put in new consult if/when necessary.

## 2020-12-22 NOTE — Progress Notes (Signed)
Patient left the unit again and went to the front entrance. While down there he caused a seen arguing with the guest services about having a third visitor. NP Revonda Standard has been informed of situation.

## 2020-12-22 NOTE — Progress Notes (Signed)
Occupational Therapy Treatment Patient Details Name: Jeffrey Webb MRN: [id number] DOB: 1975/08/28 Today's Date: 12/22/2020    History of present illness 45 y.o. male, Presented to the ER on 12/04/2020 with back pain for over 3 weeks. MRI and CT of abdomen pelvis and spine demonstrating findings concerning with L2 discitis and/or osteomyelitis and questionable psoas abscess. Per ortho MD 6/6 -right foot with current symptomatic plantar fascia.  PMH of type 2 diabetes, substance abuse, tobacco abuse   OT comments  Pt wearing CAM boot and walking around room on entry. States boot helps pain management significantly. Pt able to donn/doff boot independently. Recommend pt wear boot when up and walking, complete foot/hell cord stretches several times throughout the day, use ice on plantar surface as well and wear boot as tolerated when sleeping at night. Pt verbalized understanding. Family in room during session. Pt to follow up with ortho as outpt to monitor R foot. No further OT needs at this time. OT signing off.   Follow Up Recommendations  No OT follow up    Equipment Recommendations  None recommended by OT    Recommendations for Other Services      Precautions / Restrictions Precautions Precautions: None       Mobility Bed Mobility Overal bed mobility: Independent                  Transfers Overall transfer level: Independent                    Balance Overall balance assessment: Independent                                         ADL either performed or assessed with clinical judgement   ADL Overall ADL's : Independent                                       General ADL Comments: Able to donn/doff brace while sitting on bed using figure four position     Vision       Perception     Praxis      Cognition Arousal/Alertness: Awake/alert Behavior During Therapy: WFL for tasks assessed/performed Overall Cognitive Status:  Within Functional Limits for tasks assessed                                          Exercises     Shoulder Instructions       General Comments      Pertinent Vitals/ Pain       Pain Assessment: 0-10 Pain Score: 2  Pain Location: R foot pain Pain Descriptors / Indicators: Discomfort Pain Intervention(s): Limited activity within patient's tolerance  Home Living                                          Prior Functioning/Environment              Frequency           Progress Toward Goals  OT Goals(current goals can now be found in the care plan section)  Progress towards OT  goals: Goals met/education completed, patient discharged from OT  Acute Rehab OT Goals Patient Stated Goal: have less pain OT Goal Formulation: With patient ADL Goals Additional ADL Goal #1: Pt will be independent with use of R shoe to reduce pain R foot  Plan All goals met and education completed, patient discharged from OT services    Co-evaluation                 AM-PAC OT "6 Clicks" Daily Activity     Outcome Measure   Help from another person eating meals?: None Help from another person taking care of personal grooming?: None Help from another person toileting, which includes using toliet, bedpan, or urinal?: None Help from another person bathing (including washing, rinsing, drying)?: None Help from another person to put on and taking off regular upper body clothing?: None Help from another person to put on and taking off regular lower body clothing?: None 6 Click Score: 24    End of Session    OT Visit Diagnosis: Other abnormalities of gait and mobility (R26.89);Pain Pain - Right/Left: Right Pain - part of body:  (foot)   Activity Tolerance Patient tolerated treatment well   Patient Left Other (comment) (walking in room)   Nurse Communication Other (comment) (wearing time for CAM boot)        Time: 4196-2229 OT Time  Calculation (min): 13 min  Charges: OT General Charges $OT Visit: 1 Visit OT Treatments $Self Care/Home Management : 8-22 mins  Maurie Boettcher, OT/L   Acute OT Clinical Specialist [place] Pager [phone] Office [phone]    [facility] 12/22/2020, 12:39 PM

## 2020-12-22 NOTE — Progress Notes (Signed)
Inpatient Diabetes Program Recommendations  AACE/ADA: New Consensus Statement on Inpatient Glycemic Control  Target Ranges:  Prepandial:   less than 140 mg/dL      Peak postprandial:   less than 180 mg/dL (1-2 hours)      Critically ill patients:  140 - 180 mg/dL   Results for Jeffrey Webb, Jeffrey Webb (MRN [id number]) as of 12/22/2020 07:21  Ref. Range 12/21/2020 06:25 12/21/2020 11:34 12/21/2020 17:20 12/21/2020 21:16 12/22/2020 06:05  Glucose-Capillary Latest Ref Range: 70 - 99 mg/dL 762 (H) 831 (H) 517 (H) 102 (H) 265 (H)   Review of Glycemic Control  Diabetes history:DM2 Outpatient Diabetes medications:Metformin 500 mg BID (not taking; never started) Current orders for Inpatient glycemic control:70/30 26 units BID,Novolog 0-15 units TID with meals, Novolog 0-5 units QHS, Novolog 4 units TID with meals, Metformin 500 mg BID; Prednisone 20 mg daily  Inpatient Diabetes Program Recommendations:    Insulin: If steroids are continued as ordered please consider increasing 70/30 to 30 units BID.  Thanks, Orlando Penner, RN, MSN, CDE Diabetes Coordinator Inpatient Diabetes Program [phone] (Team Pager from 8am to 5pm)

## 2020-12-22 NOTE — Progress Notes (Addendum)
IV nurse and staff informed this rn of pt and male visitor being verbally hostile and aggressive towards one another. IV nurse concerned for pt safety. While several feet down the hall this rn suddenly hears aggressive voices and  vulgar language between pt and male visitor. This rn arrives to room to find pt in bathroom and male visitor sitting and operating a cell phone, visitor is asked to leave primacies, visitor remains in room. This rn instructs staff to call securing [facility] and md. Male visitor states "we are grown people". Security arrives and finds pt and visitor calmly sitting in room, male visitor leaves pt room and pt follows to 4N waiting area. Pt verbalizes to unit secretary he is leaving if visitor is asked to leave. Security agree to  remain in dept for pt/visitor and staff safety, visibly and observation. Dr Janee Morn informed of incident and pt noncompliance with unit safety practice. Informed md unit director has stated and discussed with pt he will be asked to leave if he walks of the unit and or goes beyond 4N waiting area.

## 2020-12-22 NOTE — Progress Notes (Addendum)
[facility] PROGRESS NOTE  Jovaun Levene GQQ:[id number] DOB: 1976/07/15 DOA: 12/04/2020 PCP: Patient, No Pcp Per (Inactive)  Status: Remains inpatient appropriate because:Hemodynamically unstable, Unsafe d/c plan, IV treatments appropriate due to intensity of illness or inability to take PO and Inpatient level of care appropriate due to severity of illness   Dispo: The patient is from: Home              Anticipated d/c is to: Home              Patient currently is not medically stable to d/c.   Difficult to place patient No   **On the evening of 6/5 patient left the unit without permission and was gone for 30 minutes after having a telephonic confrontation with his significant other.  Today on 6/8 he once again left the unit stating that he needed to get some fresh air.  He had been previously told by staff as well as on-call physician that patients are not allowed to leave the unit unless accompanied by another staff member.  It was explained that this is a risk-management issue and nothing against the patient.  I was asked to speak to the patient again today regarding his most recent partial elopement.  I again explained to him that he would need to have a staff member accompany him and we prefer to have male to male and male to male caregivers.  I also explained to the patient that due to staffing issues and acuity of patients that the unit may not be able to reach out to his immediate request to leave the unit.  He has also been made aware that if he leaves again once more without permission that he will be discharged with antibiotics and diabetic medications only   Level of care: Med-Surg  Code Status: Full Family Communication:  DVT prophylaxis: Refused Lovenox 2/2 fear of needles-changed to apixaban COVID vaccination status: Unknown    HPI: 45 year old male with DM2, substance abuse with cocaine but no IVDU per patient, tobacco use who comes into the hospital with worsening  back pain over several weeks.  He denies any trauma, injury or falls.  An MRI in the ED showed findings concerning for L2 discitis/osteomyelitis and questionable psoas abscess.  Neurosurgery, ID consulted, underwent IR guided biopsy  Subjective: Alert reporting bilateral lower abdominal groin pain without nausea or vomiting.  Denies urinary symptoms.  Objective: Vitals:   12/22/20 0030 12/22/20 0504  BP: 123/79 120/73  Pulse: 85 77  Resp: 18 18  Temp: 98.4 F (36.9 C) 98 F (36.7 C)  SpO2: 100% 100%    Intake/Output Summary (Last 24 hours) at 12/22/2020 0825 Last data filed at 12/21/2020 1000 Gross per 24 hour  Intake 120 ml  Output --  Net 120 ml   Filed Weights   12/04/20 0800 12/18/20 0459  Weight: 88.5 kg 82.1 kg    Exam:  Constitutional: Calm and in no acute distress Respiratory: Lung sounds are normal and he is stable on room air Cardiovascular: 1 S2, regular pulse, no peripheral edema Abdomen:  LBM 6/4, abdomen is soft and distended but not tympanitic with hypoactive bowel sounds.  Lower quadrants of abdomen palpated without any reports of pain. Musculoskeletal: Continues to have pain in right foot secondary to plantar fasciitis Neurologic: CN 2-12 grossly intact. Sensation intact,  Strength 5/5 x all 4 extremities.  Psychiatric: Alert and oriented x3.  Assessment/Plan: Acute problems: MSSA bacteremia with acute discitis/osteomyelitis  -ID recommended 6 wks  of IV antibiotics from date of first negative -blood cultures obtained 5/23 finally negative so will need to start 6 weeks timetable from that date (July 4) -Will also need need repeat MRI lumbar spine after antibiotics to make sure left psoas muscle phlegmon resolved -Following ESR/CRP weekly  -IR indicated that there is no fluid collection to drain.   -TTE showed no valvular involvement -Continue oxycodone,Lyrica and baclofen --continue 5% Lidocaine cream for back pain.Marland Kitchen NSAIDs dc'd 2/2  transaminitis  Abdominal discomfort/likely constipation -Patient has not had a bowel movement since 6/4 and has been refusing MiraLAX -Obtained abdominal x-ray which shows small to moderate stool burden in right colon -Increase Senokot 2 tabs twice daily -Give magnesium 400 mg every 4 hours x 4 doses -Rule out any associated urinary issues by obtaining urinalysis  Right foot pain and swelling due to acute Planter fasciitis -6/3 evaluated by Dr. Sharol Given who felt symptoms were not consistent with abscess but were consistent with Planter fasciitis -OT assisted with offloading foot to decrease pain with mobility -Continues to have swelling and discomfort so will initiate low-dose prednisone x5 days.  -We will see if OT has a splint for Planter fasciitis otherwise we will apply Ace wrap over center of foot  Nonobstructive transaminitis -Suspect related to medications -NSAIDs/Advil discontinued -AST/ALT slightly trending downward.  TB remains normal -Hepatitis pnl negative  Type 2 diabetes mellitus, uncontrolled, with hyperglycemia -A1c 14.3.   -Continue sliding scale, appreciate diabetes coordinator.   -Increase 70/30 insulin to 30 units while on steroids and continue [facility] -Patient reports that he was newly diagnosed prior to this admission.   -Newly diagnosed diabetic prior to admission. -6/7 Add back previously prescribed Metformin that patient never started prior to admission -He will need to follow-up with [facility] to establish with PCP after discharge since he has no insurance and no way to pay out-of-pocket for physician visits.  Discharge medications will be obtained through [facility] [place].  Illicit substance use -UDS positive for cocaine, patient denies any intravenous drug use as he "hates needles"  Chronic diastolic CHF -2D echo showed grade 1 diastolic dysfunction, EF 29-51%.  Appears euvolemic  Tobacco use -continue nicotine patch  Back  pain due to #1, have discontinued IV morphine and increased oral oxygen    Data Reviewed: Basic Metabolic Panel: Recent Labs  Lab 12/16/20 0703 12/18/20 0102 12/22/20 0557  NA 137 138 138  K 4.1 3.9 3.5  CL 100 103 101  CO2 _0 GLUCOSE 225* 239* 253*  BUN _1 CREATININE 0.95 0.98 0.97  CALCIUM 9.0 9.1 8.8*   Liver Function Tests: Recent Labs  Lab 12/16/20 0703 12/18/20 0102  AST 131* 82*  ALT 75* 63*  ALKPHOS 125 124  BILITOT 0.2* <0.1*  PROT 7.3 7.4  ALBUMIN 2.8* 2.9*   No results for input(s): LIPASE, AMYLASE in the last 168 hours.   CBG: Recent Labs  Lab 12/21/20 0625 12/21/20 1134 12/21/20 1720 12/21/20 2116 12/22/20 0605  GLUCAP 158* 174* 253* 102* 265*      Active Problems:   Discitis of lumbar region   Psoas abscess, left (HCC)   Type 2 diabetes mellitus with complication, without long-term current use of insulin (HCC)   Substance abuse (HCC)   Pseudohyponatremia   Abscess of plantar aspect of foot   Plantar fasciitis, right   Foot infection   Consultants:  ID  Procedures:  2D echocardiogram  Antibiotics:  Cefazolin  Time spent: 35 minutes    Erin Hearing ANP  [facility] 7 am - 330 pm/M-F for direct patient care and secure chat Please refer to Amion for contact info 18  days

## 2020-12-22 NOTE — Progress Notes (Signed)
Inpatient Diabetes Program Recommendations  AACE/ADA: New Consensus Statement on Inpatient Glycemic Control (2015)  Target Ranges:  Prepandial:   less than 140 mg/dL      Peak postprandial:   less than 180 mg/dL (1-2 hours)      Critically ill patients:  140 - 180 mg/dL   Lab Results  Component Value Date   GLUCAP 234 (H) 12/22/2020   HGBA1C 14.3 (H) 12/05/2020    Provided patient with a [employer] glucometer as he is uninsured.  Set date and time.  He states he know how to use it because his dad has the same one.  He is aware he can purchase more strips at [employer] once he runs out.     Thank you, Dulce Sellar, RN, BSN Diabetes Coordinator Inpatient Diabetes Program [phone] (team pager from 8a-5p)

## 2020-12-23 DIAGNOSIS — M545 Low back pain, unspecified: Secondary | ICD-10-CM

## 2020-12-23 DIAGNOSIS — L0291 Cutaneous abscess, unspecified: Secondary | ICD-10-CM

## 2020-12-23 LAB — BASIC METABOLIC PANEL
Anion gap: 9 (ref 5–15)
BUN: 13 mg/dL (ref 6–20)
CO2: 24 mmol/L (ref 22–32)
Calcium: 8.9 mg/dL (ref 8.9–10.3)
Chloride: 104 mmol/L (ref 98–111)
Creatinine, Ser: 0.77 mg/dL (ref 0.61–1.24)
GFR, Estimated: >60 mL/min (ref >60)
Glucose, Bld: 222 mg/dL — ABNORMAL HIGH (ref 70–99)
Potassium: 3.7 mmol/L (ref 3.5–5.1)
Sodium: 137 mmol/L (ref 135–145)

## 2020-12-23 LAB — MAGNESIUM: Magnesium: 1.8 mg/dL (ref 1.7–2.4)

## 2020-12-23 LAB — CBC
HCT: 38.4 % — ABNORMAL LOW (ref 39.0–52.0)
Hemoglobin: 12.3 g/dL — ABNORMAL LOW (ref 13.0–17.0)
MCH: 26.6 pg (ref 26.0–34.0)
MCHC: 32 g/dL (ref 30.0–36.0)
MCV: 82.9 fL (ref 80.0–100.0)
Platelets: 316 10*3/uL (ref 150–400)
RBC: 4.63 MIL/uL (ref 4.22–5.81)
RDW: 13.9 % (ref 11.5–15.5)
WBC: 10.6 10*3/uL — ABNORMAL HIGH (ref 4.0–10.5)
nRBC: 0 % (ref 0.0–0.2)

## 2020-12-23 LAB — GLUCOSE, CAPILLARY
Glucose-Capillary: 219 mg/dL — ABNORMAL HIGH (ref 70–99)
Glucose-Capillary: 209 mg/dL — ABNORMAL HIGH (ref 70–99)
Glucose-Capillary: 111 mg/dL — ABNORMAL HIGH (ref 70–99)

## 2020-12-23 MED ORDER — SORBITOL 70 % SOLN
30.0000 mL | Status: AC
  Administered 2020-12-23 (×2): 30 mL via ORAL
  Filled 2020-12-23: qty 30

## 2020-12-23 MED ORDER — INSULIN ASPART PROT & ASPART (70-30 MIX) 100 UNIT/ML ~~LOC~~ SUSP
34.0000 [IU] | Freq: Two times a day (BID) | SUBCUTANEOUS | Status: DC
  Administered 2020-12-23: 34 [IU] via SUBCUTANEOUS

## 2020-12-23 NOTE — Progress Notes (Signed)
[facility] PROGRESS NOTE  Jeffrey Webb ZDG:[id number] DOB: 1976/01/04 DOA: 12/04/2020 PCP: Patient, No Pcp Per (Inactive)  Status: Remains inpatient appropriate because:Hemodynamically unstable, Unsafe d/c plan, IV treatments appropriate due to intensity of illness or inability to take PO and Inpatient level of care appropriate due to severity of illness   Dispo: The patient is from: Home              Anticipated d/c is to: Home              Patient currently is not medically stable to d/c.   Difficult to place patient No   **On the evening of 6/5 patient left the unit without permission and was gone for 30 minutes after having a telephonic confrontation with his significant other.  Today on 6/8 he once again left the unit stating that he needed to get some fresh air.  He had been previously told by staff as well as on-call physician that patients are not allowed to leave the unit unless accompanied by another staff member.  It was explained that this is a risk-management issue and nothing against the patient.  I was asked to speak to the patient again today regarding his most recent partial elopement.  I again explained to him that he would need to have a staff member accompany him and we prefer to have male to male and male to male caregivers.  I also explained to the patient that due to staffing issues and acuity of patients that the unit may not be able to reach out to his immediate request to leave the unit.  He has also been made aware that if he leaves again once more without permission that he will be discharged with antibiotics and diabetic medications only   Level of care: Med-Surg  Code Status: Full Family Communication:  DVT prophylaxis: Refused Lovenox 2/2 fear of needles-changed to apixaban COVID vaccination status: Unknown    HPI: 45 year old male with DM2, substance abuse with cocaine but no IVDU per patient, tobacco use who comes into the hospital with worsening  back pain over several weeks.  He denies any trauma, injury or falls.  An MRI in the ED showed findings concerning for L2 discitis/osteomyelitis and questionable psoas abscess.  Neurosurgery, ID consulted, underwent IR guided biopsy  Subjective: Sleeping.  Apparently did not go to bed until late last night and also took his Ambien later than usual.  No complaints.  He had a bowel movement yesterday after getting extra laxatives.  Objective: Vitals:   12/23/20 0039 12/23/20 0450  BP: 114/77 109/74  Pulse: 85 67  Resp: 18 18  Temp: 98.4 F (36.9 C) 97.7 F (36.5 C)  SpO2: 100% 100%   No intake or output data in the 24 hours ending 12/23/20 0818  Filed Weights   12/04/20 0800 12/18/20 0459  Weight: 88.5 kg 82.1 kg    Exam:  Constitutional: Calm and in no acute distress Respiratory: Clear and he remains on room air Cardiovascular: Normal heart sounds, regular pulse, skin warm and dry Abdomen:  LBM 6/8, soft and nontender with normoactive bowel sounds and eating well Musculoskeletal: Continues to have pain in right foot secondary to plantar fasciitis Neurologic: CN 2-12 grossly intact. Sensation intact,  Strength 5/5 x all 4 extremities.  Psychiatric: Oriented x3.  Assessment/Plan: Acute problems: MSSA bacteremia with acute discitis/osteomyelitis  -ID recommended 6 wks of IV antibiotics from date of first negative -blood cultures obtained 5/23 finally negative so will need  to start 6 weeks timetable from that date (July 4) -Will also need need repeat MRI lumbar spine after antibiotics to make sure left psoas muscle phlegmon resolved -Following ESR/CRP weekly  -IR indicated that there is no fluid collection to drain.   -TTE showed no valvular involvement -Continue oxycodone,Lyrica and baclofen --continue 5% Lidocaine cream for back pain.Marland Kitchen NSAIDs dc'd 2/2 transaminitis  Abdominal discomfort/likely constipation -Patient has had bowel movement after increase in Senokot dose and  administration of total of 1600 mg of magnesium yesterday  Right foot pain and swelling due to acute Planter fasciitis -6/3 evaluated by Dr. Sharol Given who felt symptoms were not consistent with abscess but were consistent with Planter fasciitis -OT assisted with offloading foot to decrease pain with mobility -Due to continued pain and swelling was given a short course of low-dose prednisone -Cam boot  Nonobstructive transaminitis -Suspect related to medications -NSAIDs/Advil discontinued -AST/ALT slightly trending downward.  TB remains normal -Hepatitis pnl negative  Type 2 diabetes mellitus, uncontrolled, with hyperglycemia -A1c 14.3.   -Continue sliding scale, appreciate diabetes coordinator.   -Continue 70/30 insulin 34 units BID while on steroids; continue [facility] -Patient reports that he was newly diagnosed prior to this admission metformin but never obtain prescription due to cost. -Likely will need to discharge on 70/30 insulin.  Consideration should be given to resuming metformin to help with insulin resistance -He will need to follow-up with [facility] to establish with PCP after discharge since he has no insurance and no way to pay out-of-pocket for physician visits.  Discharge medications will be obtained through [facility].   Illicit substance use -UDS positive for cocaine, patient denies any intravenous drug use as he "hates needles"   Chronic diastolic CHF -2D echo showed grade 1 diastolic dysfunction, EF 82-70%.  Appears euvolemic   Tobacco use -continue nicotine patch   Back pain due to #1, have discontinued IV morphine and increased oral oxygen    Data Reviewed: Basic Metabolic Panel: Recent Labs  Lab 12/18/20 0102 12/22/20 0557 12/23/20 0352  NA 138 138 137  K 3.9 3.5 3.7  CL 103 101 104  CO2 27 27 24   GLUCOSE 239* 253* 222*  BUN 15 16 13   CREATININE 0.98 0.97 0.77  CALCIUM 9.1 8.8* 8.9  MG  --   --  1.8   Liver Function  Tests: Recent Labs  Lab 12/18/20 0102  AST 82*  ALT 63*  ALKPHOS 124  BILITOT <0.1*  PROT 7.4  ALBUMIN 2.9*   No results for input(s): LIPASE, AMYLASE in the last 168 hours.   CBG: Recent Labs  Lab 12/22/20 1111 12/22/20 1654 12/22/20 2153 12/23/20 0621 12/23/20 0743  GLUCAP 234* 201* 153* 219* 209*      Active Problems:   Discitis of lumbar region   Psoas abscess, left (HCC)   Type 2 diabetes mellitus with complication, without long-term current use of insulin (HCC)   Substance abuse (HCC)   Pseudohyponatremia   Abscess of plantar aspect of foot   Plantar fasciitis, right   Foot infection   Slow transit constipation   Constipation   MSSA bacteremia   Consultants: ID  Procedures: 2D echocardiogram  Antibiotics: Cefazolin   Time spent: 35 minutes    Erin Hearing ANP  [facility] 7 am - 330 pm/M-F for direct patient care and secure chat Please refer to Amion for contact info 19  days

## 2020-12-23 NOTE — Progress Notes (Signed)
Patient left the Floor with AMA. MD Ramiro Harvest notified. Iv removed. Given all belongings to the patient.

## 2020-12-24 DIAGNOSIS — E1169 Type 2 diabetes mellitus with other specified complication: Secondary | ICD-10-CM | POA: Diagnosis present

## 2020-12-24 DIAGNOSIS — Z79899 Other long term (current) drug therapy: Secondary | ICD-10-CM

## 2020-12-24 DIAGNOSIS — B9561 Methicillin susceptible Staphylococcus aureus infection as the cause of diseases classified elsewhere: Secondary | ICD-10-CM | POA: Diagnosis present

## 2020-12-24 DIAGNOSIS — Z20822 Contact with and (suspected) exposure to covid-19: Secondary | ICD-10-CM | POA: Diagnosis present

## 2020-12-24 DIAGNOSIS — R7881 Bacteremia: Secondary | ICD-10-CM | POA: Diagnosis present

## 2020-12-24 DIAGNOSIS — M722 Plantar fascial fibromatosis: Secondary | ICD-10-CM | POA: Diagnosis present

## 2020-12-24 DIAGNOSIS — E1165 Type 2 diabetes mellitus with hyperglycemia: Secondary | ICD-10-CM | POA: Diagnosis present

## 2020-12-24 DIAGNOSIS — M4626 Osteomyelitis of vertebra, lumbar region: Secondary | ICD-10-CM | POA: Diagnosis present

## 2020-12-24 DIAGNOSIS — F1410 Cocaine abuse, uncomplicated: Secondary | ICD-10-CM | POA: Diagnosis present

## 2020-12-24 DIAGNOSIS — M6088 Other myositis, other site: Secondary | ICD-10-CM | POA: Diagnosis present

## 2020-12-24 DIAGNOSIS — E11649 Type 2 diabetes mellitus with hypoglycemia without coma: Secondary | ICD-10-CM | POA: Diagnosis not present

## 2020-12-24 DIAGNOSIS — T40605A Adverse effect of unspecified narcotics, initial encounter: Secondary | ICD-10-CM | POA: Diagnosis present

## 2020-12-24 DIAGNOSIS — K5903 Drug induced constipation: Secondary | ICD-10-CM | POA: Diagnosis present

## 2020-12-24 DIAGNOSIS — Z7984 Long term (current) use of oral hypoglycemic drugs: Secondary | ICD-10-CM

## 2020-12-24 DIAGNOSIS — M4646 Discitis, unspecified, lumbar region: Principal | ICD-10-CM | POA: Diagnosis present

## 2020-12-24 DIAGNOSIS — R7401 Elevation of levels of liver transaminase levels: Secondary | ICD-10-CM | POA: Diagnosis present

## 2020-12-24 DIAGNOSIS — K6812 Psoas muscle abscess: Secondary | ICD-10-CM | POA: Diagnosis present

## 2020-12-24 DIAGNOSIS — F17200 Nicotine dependence, unspecified, uncomplicated: Secondary | ICD-10-CM | POA: Diagnosis present

## 2020-12-24 LAB — GLUCOSE, CAPILLARY: Glucose-Capillary: 305 mg/dL — ABNORMAL HIGH (ref 70–99)

## 2020-12-24 NOTE — Discharge Summary (Signed)
Physician Discharge Summary  Orey Moure KWI:[id number] DOB: 1976/03/10 DOA: 12/04/2020  PCP: Patient, No Pcp Per (Inactive)  Admit date: 12/04/2020 Discharge date: 12/23/2020  Time spent: 25 minutes  Recommendations for Outpatient Follow-up:  Patient left AMA late afternoon on 12/23/2020.  He did not wish to speak with the attending physician.  His IV was removed by staff and he was given all his belongings.  Because of the circumstances of leaving AMA he was not given any official x-rays or discharge medication   Discharge Diagnoses:  Active Problems:   Discitis of lumbar region   Psoas abscess, left (HCC)   Type 2 diabetes mellitus with complication, without long-term current use of insulin (HCC)   Substance abuse (HCC)   Pseudohyponatremia   Abscess of plantar aspect of foot   Plantar fasciitis, right   Foot infection   Slow transit constipation   Constipation   MSSA bacteremia    Discharge Condition: Stable  Diet recommendation: Anticipate he will resume prior to admission diet  Filed Weights   12/04/20 0800 12/18/20 0459  Weight: 88.5 kg 82.1 kg    History of present illness:  45 year old male with DM2, substance abuse with cocaine but no IVDU per patient, tobacco use who comes into the hospital with worsening back pain over several weeks.  He denies any trauma, injury or falls.  An MRI in the ED showed findings concerning for L2 discitis/osteomyelitis and questionable psoas abscess.  Neurosurgery, ID consulted, underwent IR guided biopsy  Beginning on the evening of 6/5 patient was leaving the nursing unit without permission and on that occasion he left for more than 30 minutes.  He was counseled that if he left again he would likely be discharged.  On 6/8 patient again left the unit for about 10 minutes stating he needed to get some fresh air.  Nursing staff made me aware of the situation.  I counseled the patient extensively about not leaving the unit.  He agreed to only  leave the unit accompanied by appropriate staff.  That same evening he apparently was having a verbal altercation with a visitor/significant other in his room.  On the afternoon of 6/9 after I had left for the day nursing staff notified my attending physician Dr. Janee Morn that the patient was requesting to leave AMA and did not wish to speak to the physician.  His IV was removed and he left the unit without incident.  Because of the circumstances of leaving under AMA status and without desiring to meet with the physician no prescriptions were given and including diabetes medications, antibiotics or short-term pain medications.  Hospital Course:  Acute problems: MSSA bacteremia with acute discitis/osteomyelitis  -ID recommended 6 wks of IV antibiotics from date of first negative -blood cultures obtained 5/23 finally negative so will need to start 6 weeks timetable from that date (July 4).  Unfortunately he left AMA prior to completion of appropriate antibiotic therapy -Repeat MRI lumbar spine recommended after antibiotics to make sure left psoas muscle phlegmon resolved but patient never completed pleated an appropriate course of antibiotics therefore MRI unable to be completed -IR indicated that there is no fluid collection to drain.   -TTE showed no valvular involvement -NSAIDs dc'd 2/2 transaminitis   Abdominal discomfort/likely constipation -Resolved after treatment for constipation   Right foot pain and swelling due to acute Planter fasciitis -6/3 evaluated by Dr. Lajoyce Corners who felt symptoms were not consistent with abscess but were consistent with Planter fasciitis -OT assisted with offloading  foot to decrease pain with mobility -Due to continued pain and swelling was given a short course of low-dose prednisone -Cam boot utilized during hospitalization -Orthopedic team had arranged for a follow-up appointment date and time this was provided in the patient's discharge information    Nonobstructive transaminitis -Suspect related to end-stage -LFTs were trending downward  -Hepatitis pnl negative   Type 2 diabetes mellitus, uncontrolled, with hyperglycemia -A1c 14.3.   -Utilize sliding scale insulin, 70/30 insulin and meal coverage during the hospitalization.  It was anticipated that the patient would probably need a combination of 70/30 insulin as well as metformin after discharge.  Unfortunately since he left AMA prescriptions for these medications were not provided.   -Patient admitted to being a new diabetic prior to admission and had not started his prescribed metformin.  He also did not have a glucometer to use at home.he was evaluated by the diabetes coordinator regarding education for his new diagnosis.  Unfortunately since he left AMA we were unable to provide him with a glucometer prior to leaving --Patient has follow-up appointment scheduled with [name], PA-C after discharge   Illicit substance use -UDS positive for cocaine, patient denies any intravenous drug use as he "hates needles"   Chronic diastolic CHF -2D echo showed grade 1 diastolic dysfunction, EF 55-60%.     Tobacco use -Nicotine patch provided during hospitalization   Back pain -Multiple medications were used to treat his back spasms including baclofen, topical agents such as Voltaren gel and lidocaine cream and short acting oxycodone.  These medications were not provided upon discharge  Procedures: Echocardiogram  Consultations: Neurosurgery Infectious disease Orthopedics  Discharge Exam: Vitals:   12/23/20 0039 12/23/20 0450  BP: 114/77 109/74  Pulse: 85 67  Resp: 18 18  Temp: 98.4 F (36.9 C) 97.7 F (36.5 C)  SpO2: 100% 100%   *The following examination was completed on the date of discharge but prior to patient leaving AMA Constitutional: Calm and in no acute distress Respiratory: Clear and he remains on room air Cardiovascular: Normal heart sounds, regular pulse,  skin warm and dry Abdomen:  LBM 6/8, soft and nontender with normoactive bowel sounds and eating well Musculoskeletal: Continues to have pain in right foot secondary to plantar fasciitis Neurologic: CN 2-12 grossly intact. Sensation intact,  Strength 5/5 x all 4 extremities. Psychiatric: Oriented x3.    Discharge Instructions    Allergies as of 12/23/2020   No Known Allergies      Medication List     ASK your doctor about these medications    acetaminophen 500 MG tablet Commonly known as: TYLENOL Take 2,000 mg by mouth every 6 (six) hours as needed for mild pain.   ibuprofen 200 MG tablet Commonly known as: ADVIL Take 1,000 mg by mouth every 6 (six) hours as needed for mild pain.   metFORMIN 500 MG tablet Commonly known as: GLUCOPHAGE Take 1 tablet (500 mg total) by mouth 2 (two) times daily with a meal.   methocarbamol 500 MG tablet Commonly known as: Robaxin Take 1 tablet (500 mg total) by mouth 4 (four) times daily.   multivitamin tablet Take 1 tablet by mouth daily.   sulfamethoxazole-trimethoprim 800-160 MG tablet Commonly known as: BACTRIM DS Take 1 tablet by mouth See admin instructions. Bid x 7 days   traMADol 50 MG tablet Commonly known as: Ultram Take 1 tablet (50 mg total) by mouth every 6 (six) hours as needed.       No Known Allergies  Follow-up Information     Anders Simmonds, PA-C Follow up.   Specialty: Family Medicine Why: January 20, 2021 at 0830 am arrive by 0815 am Contact information: [address] [place] [address] [phone]         Nadara Mustard, MD Follow up in 1 week(s).   Specialty: Orthopedic Surgery Contact information: [address] [place] [address] [phone]                  The results of significant diagnostics from this hospitalization (including imaging, microbiology, ancillary and laboratory) are listed below for reference.    Significant Diagnostic Studies: CT ABDOMEN PELVIS WO  CONTRAST  Result Date: 12/04/2020 CLINICAL DATA:  Abdominal pain, acute, nonlocalized. EXAM: CT ABDOMEN AND PELVIS WITHOUT CONTRAST TECHNIQUE: Multidetector CT imaging of the abdomen and pelvis was performed following the standard protocol without IV contrast. COMPARISON:  None. FINDINGS: Lower chest: Minimal atelectasis is present at the bases. No nodule or mass lesion is present. No significant airspace consolidation is present. Heart size is normal. No significant pleural or pericardial effusion is present. Hepatobiliary: No focal liver abnormality is seen. No gallstones, gallbladder wall thickening, or biliary dilatation. Pancreas: Unremarkable. No pancreatic ductal dilatation or surrounding inflammatory changes. Spleen: Normal in size without focal abnormality. Adrenals/Urinary Tract: Adrenal glands are normal bilaterally. A 2-3 mm nonobstructing stone is present at the lower pole of the left kidney. No other significant nephrolithiasis is present. Chest mass lesion or obstruction is present. Ureters are within normal limits. The urinary bladder is normal. Stomach/Bowel: Stomach and duodenum are within normal limits. Small bowel is unremarkable. Terminal ileum is within normal limits. Appendix is visualized and within normal limits. Extends into the anatomic pelvis. The ascending and transverse colon are normal. The descending and sigmoid colon are unremarkable. Vascular/Lymphatic: No significant vascular findings are present. No enlarged abdominal or pelvic lymph nodes. Reproductive: Prostate is unremarkable. Other: No abdominal wall hernia or abnormality. No abdominopelvic ascites. Musculoskeletal: Vertebral body heights and alignment are normal. Lucency noted at the inferior left aspect of the L2 vertebral body. Asymmetric ossification is present along the left side of the disc margin. No other focal lucencies are evident. IMPRESSION: 1. Focal lucency to along the left inferior aspect of the L2 vertebral  body with extension laterally raises concern for metastatic disease. Recommend MRI of the lumbar spine without and with contrast. No other focal osseous lesions are present. No primary lesion is evident. 2. No other acute or focal lesion to explain the patient's abdominal pain. 3. 2-3 mm nonobstructing stone at the lower pole of the left kidney. Electronically Signed   By: Marin Roberts M.D.   On: 12/04/2020 11:47   MR LUMBAR SPINE WO CONTRAST  Result Date: 12/04/2020 CLINICAL DATA:  Low back pain.  Abnormal CT lumbar spine EXAM: MRI LUMBAR SPINE WITHOUT CONTRAST TECHNIQUE: Multiplanar, multisequence MR imaging of the lumbar spine was performed. No intravenous contrast was administered. COMPARISON:  CT lumbar spine 12/04/2020 FINDINGS: Segmentation:  Normal Alignment:  Normal Vertebrae: There is edema in the L2 vertebral body on the left extending to the endplate. There is a smaller area of edema in the superior endplate of L3 on the left. The endplates appear eroded on the left at L2-3. There is extensive edema in the left psoas muscle which is enlarged and hyperintense on T2. There are small loculated fluid collections in the left psoas muscle suggestive of abscess. No epidural abscess. Postcontrast imaging recommended for further  evaluation. No other areas of bone marrow edema. Conus medullaris and cauda equina: Conus extends to the L1-2 level. Conus and cauda equina appear normal. Paraspinal and other soft tissues: Left psoas muscle is asymmetrically enlarged and edematous with loculated small fluid collections in the left psoas muscle. Right psoas muscle is normal. Disc levels: L1-2: Negative L2-3: Mild disc space narrowing. Endplate erosion on the left with edema extending into the L2 and L3 vertebral bodies. Right side of the disc space appears normal. No significant spinal stenosis. L3-4: Negative L4-5: Mild disc and mild facet degeneration.  Negative for stenosis L5-S1: Mild disc bulging and mild  facet degeneration. Mild subarticular and foraminal stenosis bilaterally. IMPRESSION: Endplate erosions at L2-3 on the left. There is extensive bone marrow edema on the left at L2 and a smaller area of edema in the superior bone marrow at L3 on the left. This extends into the left psoas muscle which is enlarged, edematous, with multiple loculated fluid collections. Findings most likely due to discitis rather than tumor. Postcontrast imaging is recommended to further evaluate the abscesses and evaluate for epidural abscess which is not definitely seen on the unenhanced study. These results were called by telephone at the time of interpretation on 12/04/2020 at 4:52 pm to provider PA, who verbally acknowledged these results. Electronically Signed   By: Marlan Palau M.D.   On: 12/04/2020 16:53   MR LUMBAR SPINE W CONTRAST  Result Date: 12/04/2020 CLINICAL DATA:  Probable L2-3 discitis osteomyelitis. Follow-up postcontrast imaging. EXAM: MRI LUMBAR SPINE WITH CONTRAST TECHNIQUE: Axial and sagittal T1 pre and postcontrast sequences of the lumbar spine were performed. CONTRAST:  44mL GADAVIST GADOBUTROL 1 MMOL/ML IV SOLN COMPARISON:  Same day noncontrast lumbar spine MRI and CT FINDINGS: Segmentation:  Standard. Alignment:  Physiologic. Vertebrae: Findings of discitis-osteomyelitis of the L2-3 level with endplate erosions. Prominent marrow enhancement throughout the L2 vertebral body extending into the left pedicle. Mild marrow enhancement at the superior aspect of the L3 vertebral body. Confluent low T1 signal within the vertebral bodies at these locations. The remaining vertebral bodies are within normal limits. No evidence of fracture. No additional sites of discitis-osteomyelitis are evident. Conus medullaris and cauda equina: Conus extends to the L1-2 level. No epidural phlegmon or abscess. Paraspinal and other soft tissues: Asymmetric enlargement of the left psoas muscle centered at the L2-3 level with diffuse  intramuscular enhancement. Fluid signal within the left psoas muscle without definite peripherally enhancing collection suggests myositis with phlegmon. Disc levels: Please see previous MRI report for level by level detail of the disc levels. IMPRESSION: 1. Postcontrast images of the lumbar spine are consistent with discitis-osteomyelitis at the L2-3 level. No epidural phlegmon or abscess. 2. Asymmetric enlargement of the left psoas muscle centered at the L2-3 level with diffuse intramuscular enhancement. Fluid signal within the left psoas muscle without definite peripherally enhancing collection suggests myositis with phlegmon. Electronically Signed   By: Duanne Guess D.O.   On: 12/04/2020 19:42   CT L-SPINE NO CHARGE  Result Date: 12/04/2020 CLINICAL DATA:  Low back pain several weeks. EXAM: CT LUMBAR SPINE WITHOUT CONTRAST TECHNIQUE: Multidetector CT imaging of the lumbar spine was performed without intravenous contrast administration. Multiplanar CT image reconstructions were also generated. COMPARISON:  CT abdomen pelvis 12/04/2020 FINDINGS: Segmentation: Normal Alignment: Normal Vertebrae: There is bony destruction of the inferior L2 vertebral body on the left extending to the disc space. There is also mild erosion of the superior endplate of L3 on the left. Small  amount of calcification extends lateral from the disc space into the left psoas muscle. No fracture is identified. No other skeletal lesion identified. Paraspinal and other soft tissues: No paraspinous fluid collection identified. Calcification lateral to the L2-3 disc space on the left extends into the psoas muscle which is likely due to bony destruction. Disc levels: L1-2: Negative L2-3: Mild disc space narrowing. Erosive endplate changes are present on the left at L2-3 primarily at L2 with bony destruction extending well into the left L2 vertebral body on the left. Calcification extending into the left psoas muscle. Mild left foraminal  narrowing. L3-4: Mild disc bulging and mild facet degeneration. No significant stenosis. L4-5: Mild disc bulging and mild to moderate facet degeneration bilaterally. 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 There is mild erosion of the superior endplate of L3. There appears to be bone extending into the left psoas muscle which is likely due to destructive mass lesion. Favor metastatic disease although infection is a consideration. Recommend MRI lumbar spine without with contrast. No significant spinal stenosis. Electronically Signed   By: Marlan Palau M.D.   On: 12/04/2020 11:33   DG Abd 2 Views  Result Date: 12/22/2020 CLINICAL DATA:  Lower abdominal pain for 1 month EXAM: ABDOMEN - 2 VIEW COMPARISON:  CT 12/04/2020 FINDINGS: Nonobstructive bowel gas pattern. There is a moderate stool burden within the colon. There is no acute osseous abnormality. IMPRESSION: No evidence of bowel obstruction. Moderate stool burden within the colon. Electronically Signed   By: Caprice Renshaw   On: 12/22/2020 13:58   DG Foot Complete Right  Result Date: 12/16/2020 CLINICAL DATA:  Abscess EXAM: RIGHT FOOT COMPLETE - 3+ VIEW COMPARISON:  None. FINDINGS: Alignment is anatomic. No acute fracture. Joint spaces are preserved. There is no erosive change or periosteal reaction. No evidence of soft tissue gas. No radiopaque foreign body. IMPRESSION: Negative. Electronically Signed   By: Guadlupe Spanish M.D.   On: 12/16/2020 11:56   ECHOCARDIOGRAM COMPLETE  Result Date: 12/06/2020    ECHOCARDIOGRAM REPORT   Patient Name:   Jeffrey Webb Date of Exam: 12/06/2020 Medical Rec #:  [id number]    Height:       66.0 in Accession #:    [id number]   Weight:       195.0 lb Date of Birth:  1976/02/13    BSA:          1.978 m Patient Age:    45 years     BP:           125/85 mmHg Patient Gender: M            HR:           98 bpm. Exam Location:   Inpatient Procedure: 2D Echo, Cardiac Doppler and Color Doppler Indications:    Bacteremia R78.81  History:        Patient has no prior history of Echocardiogram examinations.  Sonographer:    Elmarie Shiley Dance Referring Phys: [id number] Dacian W COMER IMPRESSIONS  1. Left ventricular ejection fraction, by estimation, is 55 to 60%. The left ventricle has normal function. The left ventricle has no regional wall motion abnormalities. Left ventricular diastolic parameters are consistent with Grade I diastolic dysfunction (impaired relaxation).  2. Right ventricular systolic function is normal. The right ventricular size is normal. Tricuspid regurgitation signal is inadequate for assessing PA pressure.  3. The mitral valve is normal in  structure. No evidence of mitral valve regurgitation. No evidence of mitral stenosis.  4. The aortic valve is tricuspid. Aortic valve regurgitation is not visualized. No aortic stenosis is present.  5. The inferior vena cava is normal in size with greater than 50% respiratory variability, suggesting right atrial pressure of 3 mmHg.  6. No valvular vegetation noted. FINDINGS  Left Ventricle: Left ventricular ejection fraction, by estimation, is 55 to 60%. The left ventricle has normal function. The left ventricle has no regional wall motion abnormalities. The left ventricular internal cavity size was normal in size. There is  no left ventricular hypertrophy. Left ventricular diastolic parameters are consistent with Grade I diastolic dysfunction (impaired relaxation). Right Ventricle: The right ventricular size is normal. No increase in right ventricular wall thickness. Right ventricular systolic function is normal. Tricuspid regurgitation signal is inadequate for assessing PA pressure. Left Atrium: Left atrial size was normal in size. Right Atrium: Right atrial size was normal in size. Pericardium: Trivial pericardial effusion is present. Mitral Valve: The mitral valve is normal in structure. No  evidence of mitral valve regurgitation. No evidence of mitral valve stenosis. Tricuspid Valve: The tricuspid valve is normal in structure. Tricuspid valve regurgitation is not demonstrated. Aortic Valve: The aortic valve is tricuspid. Aortic valve regurgitation is not visualized. No aortic stenosis is present. Pulmonic Valve: The pulmonic valve was normal in structure. Pulmonic valve regurgitation is trivial. Aorta: The aortic root is normal in size and structure. Venous: The inferior vena cava is normal in size with greater than 50% respiratory variability, suggesting right atrial pressure of 3 mmHg. IAS/Shunts: No atrial level shunt detected by color flow Doppler.  LEFT VENTRICLE PLAX 2D LVIDd:         3.90 cm  Diastology LVIDs:         2.50 cm  LV e' medial:    5.77 cm/s LV PW:         0.90 cm  LV E/e' medial:  8.7 LV IVS:        1.10 cm  LV e' lateral:   9.79 cm/s LVOT diam:     2.00 cm  LV E/e' lateral: 5.1 LV SV:         39 LV SV Index:   20 LVOT Area:     3.14 cm  RIGHT VENTRICLE             IVC RV Basal diam:  2.10 cm     IVC diam: 1.20 cm RV S prime:     16.10 cm/s TAPSE (M-mode): 1.7 cm LEFT ATRIUM             Index       RIGHT ATRIUM          Index LA diam:        2.70 cm 1.36 cm/m  RA Area:     6.15 cm LA Vol (A2C):   21.5 ml 10.87 ml/m RA Volume:   8.28 ml  4.19 ml/m LA Vol (A4C):   16.5 ml 8.34 ml/m LA Biplane Vol: 20.2 ml 10.21 ml/m  AORTIC VALVE LVOT Vmax:   89.70 cm/s LVOT Vmean:  57.200 cm/s LVOT VTI:    0.124 m  AORTA Ao Root diam: 3.30 cm Ao Asc diam:  2.50 cm MITRAL VALVE MV Area (PHT): 4.49 cm    SHUNTS MV Decel Time: 169 msec    Systemic VTI:  0.12 m MV E velocity: 50.00 cm/s  Systemic Diam: 2.00 cm MV A velocity: 55.30  cm/s MV E/A ratio:  0.90 Marca Ancona MD Electronically signed by Marca Ancona MD Signature Date/Time: 12/06/2020/7:57:10 PM    Final    [place] RT LOWER EXTREM LTD SOFT TISSUE NON VASCULAR  Result Date: 12/18/2020 CLINICAL DATA:  Right foot plantar foot infection EXAM:  ULTRASOUND RIGHT LOWER EXTREMITY LIMITED TECHNIQUE: Ultrasound examination of the lower extremity soft tissues was performed in the area of clinical concern. COMPARISON:  None. FINDINGS: Joint Space: Not applicable Muscles: Not applicable Tendons: Not applicable Other Soft Tissue Structures: Limited grayscale and color Doppler sonography in the area of previous incision and drainage demonstrates a focal area of subcutaneous interstitial edema without discrete loculated fluid collection identified. There is mild hyperemia in this region with mild thickening of the overlying skin. IMPRESSION: Focal interstitial edema in the area of prior surgical incision without discrete drainable fluid collection identified. Electronically Signed   By: Helyn Numbers MD   On: 12/18/2020 00:33    Microbiology: No results found for this or any previous visit (from the past 240 hour(s)).   Labs: Basic Metabolic Panel: Recent Labs  Lab 12/18/20 0102 12/22/20 0557 12/23/20 0352  NA 138 138 137  K 3.9 3.5 3.7  CL 103 101 104  CO2 GLUCOSE 239* 253* 222*  BUN CREATININE 0.98 0.97 0.77  CALCIUM 9.1 8.8* 8.9  MG  --   --  1.8   Liver Function Tests: Recent Labs  Lab 12/18/20 0102  AST 82*  ALT 63*  ALKPHOS 124  BILITOT <0.1*  PROT 7.4  ALBUMIN 2.9*   No results for input(s): LIPASE, AMYLASE in the last 168 hours. No results for input(s): AMMONIA in the last 168 hours. CBC: Recent Labs  Lab 12/18/20 0102 12/23/20 0352  WBC 7.2 10.6*  NEUTROABS 4.1  --   HGB 11.3* 12.3*  HCT 35.5* 38.4*  MCV 83.1 82.9  PLT 289 316   Cardiac Enzymes: No results for input(s): CKTOTAL, CKMB, CKMBINDEX, TROPONINI in the last 168 hours. BNP: BNP (last 3 results) No results for input(s): BNP in the last 8760 hours.  ProBNP (last 3 results) No results for input(s): PROBNP in the last 8760 hours.  CBG: Recent Labs  Lab 12/22/20 1654 12/22/20 2153 12/23/20 0621 12/23/20 0743 12/23/20 1103   GLUCAP 201* 153* 219* 209* 111*       Signed:  Junious Silk ANP [facility] 12/24/2020, 8:18 AM

## 2020-12-25 ENCOUNTER — Other Ambulatory Visit: Payer: Self-pay

## 2020-12-25 ENCOUNTER — Inpatient Hospital Stay (HOSPITAL_COMMUNITY)
Admission: EM | Admit: 2020-12-25 | Discharge: 2020-12-29 | DRG: 551 | Disposition: A | Discharge status: Home / Self Care | Payer: Self-pay | Attending: Family Medicine | Admitting: Family Medicine

## 2020-12-25 ENCOUNTER — Encounter (HOSPITAL_COMMUNITY): Payer: Self-pay | Admitting: *Deleted

## 2020-12-25 DIAGNOSIS — F1910 Other psychoactive substance abuse, uncomplicated: Secondary | ICD-10-CM | POA: Diagnosis present

## 2020-12-25 DIAGNOSIS — R10811 Right upper quadrant abdominal tenderness: Secondary | ICD-10-CM

## 2020-12-25 DIAGNOSIS — B9561 Methicillin susceptible Staphylococcus aureus infection as the cause of diseases classified elsewhere: Secondary | ICD-10-CM | POA: Diagnosis present

## 2020-12-25 DIAGNOSIS — M545 Low back pain, unspecified: Secondary | ICD-10-CM

## 2020-12-25 DIAGNOSIS — L0291 Cutaneous abscess, unspecified: Secondary | ICD-10-CM | POA: Diagnosis present

## 2020-12-25 DIAGNOSIS — IMO0002 Reserved for concepts with insufficient information to code with codable children: Secondary | ICD-10-CM | POA: Diagnosis present

## 2020-12-25 DIAGNOSIS — M4646 Discitis, unspecified, lumbar region: Principal | ICD-10-CM | POA: Diagnosis present

## 2020-12-25 DIAGNOSIS — M549 Dorsalgia, unspecified: Secondary | ICD-10-CM | POA: Diagnosis present

## 2020-12-25 DIAGNOSIS — R7881 Bacteremia: Secondary | ICD-10-CM | POA: Diagnosis present

## 2020-12-25 DIAGNOSIS — M722 Plantar fascial fibromatosis: Secondary | ICD-10-CM | POA: Diagnosis present

## 2020-12-25 DIAGNOSIS — E1165 Type 2 diabetes mellitus with hyperglycemia: Secondary | ICD-10-CM

## 2020-12-25 DIAGNOSIS — K5900 Constipation, unspecified: Secondary | ICD-10-CM

## 2020-12-25 HISTORY — DX: Reserved for concepts with insufficient information to code with codable children: IMO0002

## 2020-12-25 HISTORY — DX: Cocaine abuse, uncomplicated: F14.10

## 2020-12-25 HISTORY — DX: Tobacco use: Z72.0

## 2020-12-25 LAB — CBG MONITORING, ED
Glucose-Capillary: 175 mg/dL — ABNORMAL HIGH (ref 70–99)
Glucose-Capillary: 42 mg/dL — CL (ref 70–99)
Glucose-Capillary: 268 mg/dL — ABNORMAL HIGH (ref 70–99)
Glucose-Capillary: 247 mg/dL — ABNORMAL HIGH (ref 70–99)

## 2020-12-25 LAB — COMPREHENSIVE METABOLIC PANEL
ALT: 15 U/L (ref 0–44)
AST: 26 U/L (ref 15–41)
Albumin: 3.1 g/dL — ABNORMAL LOW (ref 3.5–5.0)
Alkaline Phosphatase: 104 U/L (ref 38–126)
Anion gap: 8 (ref 5–15)
BUN: 11 mg/dL (ref 6–20)
CO2: 24 mmol/L (ref 22–32)
Calcium: 8.9 mg/dL (ref 8.9–10.3)
Chloride: 106 mmol/L (ref 98–111)
Creatinine, Ser: 0.94 mg/dL (ref 0.61–1.24)
GFR, Estimated: >60 mL/min (ref >60)
Glucose, Bld: 208 mg/dL — ABNORMAL HIGH (ref 70–99)
Potassium: 3.6 mmol/L (ref 3.5–5.1)
Sodium: 138 mmol/L (ref 135–145)
Total Bilirubin: 0.6 mg/dL (ref 0.3–1.2)
Total Protein: 7 g/dL (ref 6.5–8.1)

## 2020-12-25 LAB — CBC
HCT: 37.4 % — ABNORMAL LOW (ref 39.0–52.0)
Hemoglobin: 11.7 g/dL — ABNORMAL LOW (ref 13.0–17.0)
MCH: 26.2 pg (ref 26.0–34.0)
MCHC: 31.3 g/dL (ref 30.0–36.0)
MCV: 83.7 fL (ref 80.0–100.0)
Platelets: 349 10*3/uL (ref 150–400)
RBC: 4.47 MIL/uL (ref 4.22–5.81)
RDW: 14.1 % (ref 11.5–15.5)
WBC: 7.9 10*3/uL (ref 4.0–10.5)
nRBC: 0 % (ref 0.0–0.2)

## 2020-12-25 LAB — LACTIC ACID, PLASMA: Lactic Acid, Venous: 1.8 mmol/L (ref 0.5–1.9)

## 2020-12-25 LAB — C-REACTIVE PROTEIN: CRP: 1.2 mg/dL — ABNORMAL HIGH (ref <1.0)

## 2020-12-25 LAB — RESP PANEL BY RT-PCR (FLU A&B, COVID) ARPGX2
Influenza A by PCR: NEGATIVE
Influenza B by PCR: NEGATIVE
SARS Coronavirus 2 by RT PCR: NEGATIVE

## 2020-12-25 LAB — GLUCOSE, CAPILLARY: Glucose-Capillary: 118 mg/dL — ABNORMAL HIGH (ref 70–99)

## 2020-12-25 LAB — SEDIMENTATION RATE: Sed Rate: 34 mm/hr — ABNORMAL HIGH (ref 0–16)

## 2020-12-25 MED ORDER — ACETAMINOPHEN 325 MG PO TABS
650.0000 mg | ORAL_TABLET | Freq: Four times a day (QID) | ORAL | Status: DC | PRN
Start: 2020-12-25 — End: 2020-12-29
  Administered 2020-12-25: 650 mg via ORAL
  Filled 2020-12-25: qty 2

## 2020-12-25 MED ORDER — LIDOCAINE 5 % EX PTCH
1.0000 | MEDICATED_PATCH | CUTANEOUS | Status: DC
  Administered 2020-12-25 – 2020-12-26 (×2): 1 via TRANSDERMAL
  Filled 2020-12-25 (×3): qty 1

## 2020-12-25 MED ORDER — ZOLPIDEM TARTRATE 5 MG PO TABS
10.0000 mg | ORAL_TABLET | Freq: Every evening | ORAL | Status: DC | PRN
  Administered 2020-12-25: 10 mg via ORAL
  Filled 2020-12-25: qty 2

## 2020-12-25 MED ORDER — ACETAMINOPHEN 650 MG RE SUPP: 650.0000 mg | Freq: Four times a day (QID) | RECTAL | Status: DC | PRN

## 2020-12-25 MED ORDER — ONDANSETRON HCL 4 MG PO TABS: 4.0000 mg | ORAL_TABLET | Freq: Four times a day (QID) | ORAL | Status: DC | PRN

## 2020-12-25 MED ORDER — PREGABALIN 100 MG PO CAPS
100.0000 mg | ORAL_CAPSULE | Freq: Three times a day (TID) | ORAL | Status: DC
  Administered 2020-12-25 – 2020-12-29 (×10): 100 mg via ORAL
  Filled 2020-12-25 (×12): qty 1

## 2020-12-25 MED ORDER — PANTOPRAZOLE SODIUM 40 MG PO TBEC
40.0000 mg | DELAYED_RELEASE_TABLET | Freq: Every day | ORAL | Status: DC
  Administered 2020-12-25 – 2020-12-29 (×5): 40 mg via ORAL
  Filled 2020-12-25 (×5): qty 1

## 2020-12-25 MED ORDER — ALBUTEROL SULFATE (2.5 MG/3ML) 0.083% IN NEBU: 2.5000 mg | INHALATION_SOLUTION | Freq: Four times a day (QID) | RESPIRATORY_TRACT | Status: DC | PRN

## 2020-12-25 MED ORDER — ONDANSETRON HCL 4 MG/2ML IJ SOLN: 4.0000 mg | Freq: Four times a day (QID) | INTRAMUSCULAR | Status: DC | PRN

## 2020-12-25 MED ORDER — DEXTROSE 50 % IV SOLN
1.0000 | Freq: Once | INTRAVENOUS | Status: AC
  Administered 2020-12-25: 50 mL via INTRAVENOUS
  Filled 2020-12-25: qty 50

## 2020-12-25 MED ORDER — INSULIN ASPART PROT & ASPART (70-30 MIX) 100 UNIT/ML ~~LOC~~ SUSP
30.0000 [IU] | Freq: Two times a day (BID) | SUBCUTANEOUS | Status: DC
  Administered 2020-12-25: 30 [IU] via SUBCUTANEOUS
  Filled 2020-12-25: qty 10

## 2020-12-25 MED ORDER — BACLOFEN 10 MG PO TABS
10.0000 mg | ORAL_TABLET | Freq: Three times a day (TID) | ORAL | Status: DC
  Administered 2020-12-25 – 2020-12-29 (×11): 10 mg via ORAL
  Filled 2020-12-25 (×12): qty 1

## 2020-12-25 MED ORDER — INSULIN ASPART PROT & ASPART (70-30 MIX) 100 UNIT/ML ~~LOC~~ SUSP
10.0000 [IU] | Freq: Two times a day (BID) | SUBCUTANEOUS | Status: DC
  Administered 2020-12-25 – 2020-12-28 (×6): 10 [IU] via SUBCUTANEOUS
  Filled 2020-12-25: qty 10

## 2020-12-25 MED ORDER — OXYCODONE HCL 5 MG PO TABS
5.0000 mg | ORAL_TABLET | ORAL | Status: DC | PRN
  Administered 2020-12-25: 10 mg via ORAL
  Filled 2020-12-25: qty 2

## 2020-12-25 MED ORDER — CEFAZOLIN SODIUM-DEXTROSE 1-4 GM/50ML-% IV SOLN
1.0000 g | Freq: Three times a day (TID) | INTRAVENOUS | Status: DC
  Administered 2020-12-25 – 2020-12-26 (×4): 1 g via INTRAVENOUS
  Filled 2020-12-25 (×4): qty 50

## 2020-12-25 MED ORDER — SENNOSIDES-DOCUSATE SODIUM 8.6-50 MG PO TABS
1.0000 | ORAL_TABLET | Freq: Two times a day (BID) | ORAL | Status: DC
  Administered 2020-12-25 – 2020-12-29 (×5): 1 via ORAL
  Filled 2020-12-25 (×7): qty 1

## 2020-12-25 MED ORDER — NICOTINE 21 MG/24HR TD PT24
21.0000 mg | MEDICATED_PATCH | Freq: Every day | TRANSDERMAL | Status: DC
  Administered 2020-12-25 – 2020-12-29 (×5): 21 mg via TRANSDERMAL
  Filled 2020-12-25 (×5): qty 1

## 2020-12-25 MED ORDER — INSULIN ASPART 100 UNIT/ML IJ SOLN
0.0000 [IU] | Freq: Three times a day (TID) | INTRAMUSCULAR | Status: DC
  Administered 2020-12-26: 1 [IU] via SUBCUTANEOUS
  Administered 2020-12-26: 3 [IU] via SUBCUTANEOUS

## 2020-12-25 MED ORDER — RIVAROXABAN 10 MG PO TABS
10.0000 mg | ORAL_TABLET | Freq: Every day | ORAL | Status: DC
  Administered 2020-12-25 – 2020-12-29 (×5): 10 mg via ORAL
  Filled 2020-12-25 (×5): qty 1
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
## 2020-12-25 NOTE — ED Notes (Signed)
Patient states he signed himself out AMS several days ago because of a family emergency and now is coming back to complete his IV antibiotics, He states he is supposed to get meds for 6 weeks.
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
## 2020-12-25 NOTE — ED Notes (Signed)
Tried to call report. Pam RN said will call back

## 2020-12-25 NOTE — ED Notes (Signed)
MD notified of pt CBG result. Pt given sprite and graham crackers. Pt had already eaten his lunch tray, ordered second lunch tray for patient.

## 2020-12-25 NOTE — ED Notes (Signed)
Waiting for trasport

## 2020-12-25 NOTE — ED Notes (Signed)
Attempted report 

## 2020-12-26 LAB — GLUCOSE, CAPILLARY
Glucose-Capillary: 209 mg/dL — ABNORMAL HIGH (ref 70–99)
Glucose-Capillary: 129 mg/dL — ABNORMAL HIGH (ref 70–99)
Glucose-Capillary: 188 mg/dL — ABNORMAL HIGH (ref 70–99)
Glucose-Capillary: 209 mg/dL — ABNORMAL HIGH (ref 70–99)

## 2020-12-26 LAB — RAPID URINE DRUG SCREEN, HOSP PERFORMED
Amphetamines: NOT DETECTED
Barbiturates: NOT DETECTED
Benzodiazepines: NOT DETECTED
Cocaine: POSITIVE — AB
Opiates: NOT DETECTED
Tetrahydrocannabinol: NOT DETECTED

## 2020-12-26 MED ORDER — POLYETHYLENE GLYCOL 3350 17 G PO PACK
17.0000 g | PACK | Freq: Two times a day (BID) | ORAL | Status: DC
  Filled 2020-12-26 (×5): qty 1

## 2020-12-26 MED ORDER — OXYCODONE HCL 5 MG PO TABS
15.0000 mg | ORAL_TABLET | Freq: Four times a day (QID) | ORAL | Status: DC | PRN
  Administered 2020-12-27 – 2020-12-29 (×6): 15 mg via ORAL
  Filled 2020-12-26 (×7): qty 3

## 2020-12-26 MED ORDER — CEFAZOLIN SODIUM-DEXTROSE 2-4 GM/100ML-% IV SOLN
2.0000 g | Freq: Three times a day (TID) | INTRAVENOUS | Status: DC
  Administered 2020-12-26 – 2020-12-29 (×9): 2 g via INTRAVENOUS
  Filled 2020-12-26 (×10): qty 100

## 2020-12-26 NOTE — Plan of Care (Signed)
?  Problem: Clinical Measurements: ?Goal: Ability to maintain clinical measurements within normal limits will improve ?Outcome: Progressing ?Goal: Will remain free from infection ?Outcome: Progressing ?Goal: Diagnostic test results will improve ?Outcome: Progressing ?  ?

## 2020-12-26 NOTE — Progress Notes (Signed)
PHARMACY NOTE:  ANTIMICROBIAL RENAL DOSAGE ADJUSTMENT  Current antimicrobial regimen includes a mismatch between antimicrobial dosage and estimated renal function.  As per policy approved by the Pharmacy & Therapeutics and Medical Executive Committees, the antimicrobial dosage will be adjusted accordingly.  Current antimicrobial dosage:  Cefazolin 1g IV q8  Indication: MSSA bacteremia  Renal Function:  Estimated Creatinine Clearance: 100.9 mL/min (by C-G formula based on SCr of 0.94 mg/dL). []      On intermittent HD, scheduled: []      On CRRT    Antimicrobial dosage has been changed to:  Cefazolin 2g IV q8  Additional comments:   , PharmD, BCIDP, AAHIVP, CPP Infectious Disease Pharmacist 12/26/2020 11:58 AM

## 2020-12-26 NOTE — Plan of Care (Signed)
  Problem: Activity: Goal: Risk for activity intolerance will decrease Outcome: Progressing   Problem: Coping: Goal: Level of anxiety will decrease Outcome: Progressing   Problem: Pain Managment: Goal: General experience of comfort will improve Outcome: Progressing   Problem: Safety: Goal: Ability to remain free from injury will improve Outcome: Progressing   Problem: Skin Integrity: Goal: Risk for impaired skin integrity will decrease Outcome: Progressing   

## 2020-12-26 NOTE — Progress Notes (Signed)
PROGRESS NOTE    Jeffrey Webb  SFK:[id number] DOB: May 27, 1976 DOA: 12/25/2020 PCP: Patient, No Pcp Per (Inactive)   Brief Narrative: Jeffrey Webb is a 45 y.o. male with medical history significant of recent hospitalization with MSSA bacteremia, lumbar osteomyelitis/discitis, concern for left psoas muscle phlegmon newly diagnosed diabetes mellitus type 2, and polysubstance abuse including cocaine and tobacco. Patient presented to resume IV antibiotic treatment for bacteremia/osteomyelitis/discitis.   Assessment & Plan:   Principal Problem:   MSSA bacteremia Active Problems:   Discitis of lumbar region   Substance abuse (HCC)   Plantar fasciitis, right   Diabetes mellitus type 2, uncontrolled (HCC)   Back pain   Phlegmon   MSSA bacteremia L2-3 Discitis/osteomyelitis L2-3 myositis with phlegmon Patient was previously admitted and being treated with Cefazolin IV. ID at that time recommended a 6 course of IV antibiotics with end date of July 4th and recommendation for repeat MRI lumbar spine after antibiotics. -Continue Cefazolin; ID consult in AM to verify no changes to management -Oxycodone, Lyrica prn for pain -Continue Baclofen with plan for taper  Abdominal discomfort Constipation Secondary to narcotics -Senokot-S, Miralax BID  Plantar fasciitis Outpatient orthopedic surgery follow-up  Diabetes mellitus, type 2 -Continue Novolog 70/30 10 units BID; titrate for blood sugar control -Discontinue SSI  Polysubstance abuse Cocaine positive on last admission. Ongoing tobacco use. -Continue nicotine patch   DVT prophylaxis: Xarelto (VTE dose) Code Status:   Code Status: Full Code Family Communication: None at bedside Disposition Plan: Discharge home pending completion of IV antibiotics   Consultants:  None  Procedures:  None  Antimicrobials: Cefazolin    Subjective: Back pain  Objective: Vitals:   12/25/20 1815 12/25/20 1830 12/25/20 2202 12/26/20 1322   BP: 102/68 93/61 116/84 113/71  Pulse: 80 79 69 77  Resp:   16 18  Temp:   99 F (37.2 C) 99.1 F (37.3 C)  TempSrc:    Oral  SpO2: 99% 99% 100% 100%  Weight:      Height:        Intake/Output Summary (Last 24 hours) at 12/26/2020 1338 Last data filed at 12/26/2020 1215 Gross per 24 hour  Intake 290 ml  Output --  Net 290 ml   Filed Weights   12/25/20 0018  Weight: 82.1 kg    Examination:  General exam: Appears calm and comfortable Respiratory system: Clear to auscultation. Respiratory effort normal. Cardiovascular system: S1 & S2 heard, RRR. No murmurs, rubs, gallops or clicks. Gastrointestinal system: Abdomen is nondistended, soft and nontender. No organomegaly or masses felt. Normal bowel sounds heard. Central nervous system: Alert and oriented. No focal neurological deficits. Musculoskeletal: No edema. No calf tenderness Skin: No cyanosis. No rashes Psychiatry: Judgement and insight appear normal. Mood & affect appropriate.     Data Reviewed: I have personally reviewed following labs and imaging studies  CBC Lab Results  Component Value Date   WBC 7.9 12/25/2020   RBC 4.47 12/25/2020   HGB 11.7 (L) 12/25/2020   HCT 37.4 (L) 12/25/2020   MCV 83.7 12/25/2020   MCH 26.2 12/25/2020   PLT 349 12/25/2020   MCHC 31.3 12/25/2020   RDW 14.1 12/25/2020   LYMPHSABS 2.1 12/18/2020   MONOABS 0.8 12/18/2020   EOSABS 0.1 12/18/2020   BASOSABS 0.1 12/18/2020     Last metabolic panel Lab Results  Component Value Date   NA 138 12/25/2020   K 3.6 12/25/2020   CL 106 12/25/2020   CO2 24 12/25/2020   BUN  11 12/25/2020   CREATININE 0.94 12/25/2020   GLUCOSE 208 (H) 12/25/2020   GFRNONAA >60 12/25/2020   CALCIUM 8.9 12/25/2020   PHOS 4.2 12/05/2020   PROT 7.0 12/25/2020   ALBUMIN 3.1 (L) 12/25/2020   BILITOT 0.6 12/25/2020   ALKPHOS 104 12/25/2020   AST 26 12/25/2020   ALT 15 12/25/2020   ANIONGAP 8 12/25/2020    CBG (last 3)  Recent Labs     12/25/20 2202 12/26/20 0803 12/26/20 1158  GLUCAP 118* 209* 129*     GFR: Estimated Creatinine Clearance: 100.9 mL/min (by C-G formula based on SCr of 0.94 mg/dL).  Coagulation Profile: No results for input(s): INR, PROTIME in the last 168 hours.  Recent Results (from the past 240 hour(s))  Resp Panel by RT-PCR (Flu A&B, Covid) Nasopharyngeal Swab     Status: None   Collection Time: 12/25/20  8:13 AM   Specimen: Nasopharyngeal Swab; Nasopharyngeal(NP) swabs in vial transport medium  Result Value Ref Range Status   SARS Coronavirus 2 by RT PCR NEGATIVE NEGATIVE Final    Comment: (NOTE) SARS-CoV-2 target nucleic acids are NOT DETECTED.  The SARS-CoV-2 RNA is generally detectable in upper respiratory specimens during the acute phase of infection. The lowest concentration of SARS-CoV-2 viral copies this assay can detect is 138 copies/mL. A negative result does not preclude SARS-Cov-2 infection and should not be used as the sole basis for treatment or other patient management decisions. A negative result may occur with  improper specimen collection/handling, submission of specimen other than nasopharyngeal swab, presence of viral mutation(s) within the areas targeted by this assay, and inadequate number of viral copies(<138 copies/mL). A negative result must be combined with clinical observations, patient history, and epidemiological information. The expected result is Negative.  Fact Sheet for Patients:  BloggerCourse.com  Fact Sheet for Healthcare Providers:  SeriousBroker.it  This test is no t yet approved or cleared by the [place] FDA and  has been authorized for detection and/or diagnosis of SARS-CoV-2 by FDA under an Emergency Use Authorization (EUA). This EUA will remain  in effect (meaning this test can be used) for the duration of the COVID-19 declaration under Section 564(b)(1) of the Act, 21 U.S.C.section  360bbb-3(b)(1), unless the authorization is terminated  or revoked sooner.       Influenza A by PCR NEGATIVE NEGATIVE Final   Influenza B by PCR NEGATIVE NEGATIVE Final    Comment: (NOTE) The Xpert Xpress SARS-CoV-2/FLU/RSV plus assay is intended as an aid in the diagnosis of influenza from Nasopharyngeal swab specimens and should not be used as a sole basis for treatment. Nasal washings and aspirates are unacceptable for Xpert Xpress SARS-CoV-2/FLU/RSV testing.  Fact Sheet for Patients: BloggerCourse.com  Fact Sheet for Healthcare Providers: SeriousBroker.it  This test is not yet approved or cleared by the [place] FDA and has been authorized for detection and/or diagnosis of SARS-CoV-2 by FDA under an Emergency Use Authorization (EUA). This EUA will remain in effect (meaning this test can be used) for the duration of the COVID-19 declaration under Section 564(b)(1) of the Act, 21 U.S.C. section 360bbb-3(b)(1), unless the authorization is terminated or revoked.  Performed at [facility], [address], [place], [place] [address]   Blood culture (routine x 2)     Status: None (Preliminary result)   Collection Time: 12/25/20  8:17 AM   Specimen: BLOOD RIGHT FOREARM  Result Value Ref Range Status   Specimen Description BLOOD RIGHT FOREARM  Final  Special Requests   Final    BOTTLES DRAWN AEROBIC AND ANAEROBIC Blood Culture adequate volume   Culture   Final    NO GROWTH < 24 HOURS Performed at [facility], [address], [place], [place] [address]    Report Status PENDING  Incomplete  Blood culture (routine x 2)     Status: None (Preliminary result)   Collection Time: 12/25/20  8:22 AM   Specimen: BLOOD RIGHT HAND  Result Value Ref Range Status   Specimen Description BLOOD RIGHT HAND  Final   Special Requests   Final    BOTTLES DRAWN AEROBIC AND ANAEROBIC Blood Culture adequate volume   Culture    Final    NO GROWTH < 24 HOURS Performed at [facility], [address] [place], [place] [address]    Report Status PENDING  Incomplete        Radiology Studies: No results found.      Scheduled Meds:  baclofen  10 mg Oral TID   insulin aspart  0-9 Units Subcutaneous TID WC   insulin aspart protamine- aspart  10 Units Subcutaneous BID WC   lidocaine  1 patch Transdermal Q24H   nicotine  21 mg Transdermal Daily   pantoprazole  40 mg Oral Daily   pregabalin  100 mg Oral TID   rivaroxaban  10 mg Oral Daily   senna-docusate  1 tablet Oral BID   Continuous Infusions:   ceFAZolin (ANCEF) IV       LOS: 1 day     Jacquelin Hawking, MD [facility] 12/26/2020, 1:38 PM  If 7PM-7AM, please contact night-coverage [email or web address]

## 2020-12-27 LAB — GLUCOSE, CAPILLARY
Glucose-Capillary: 251 mg/dL — ABNORMAL HIGH (ref 70–99)
Glucose-Capillary: 196 mg/dL — ABNORMAL HIGH (ref 70–99)
Glucose-Capillary: 216 mg/dL — ABNORMAL HIGH (ref 70–99)
Glucose-Capillary: 240 mg/dL — ABNORMAL HIGH (ref 70–99)

## 2020-12-27 NOTE — Plan of Care (Signed)
  RD consulted for nutrition education regarding diabetes.   Lab Results  Component Value Date   HGBA1C 14.3 (H) 12/05/2020   Spoke with pt, who reports feeling better today. He reports he was just diagnosed with DM this admission. He acknowledges need to check his CBGS and will be going home on insulin; he feels comfortable performing these tasks. RD discussed how DM is controlled differently in the hospital and need for tighter control secondary to acute illness/ infection.   Pt shares he has a good appetite, but complains of constant hunger. He reports that he is thinking of making lifestyle changes as he was consuming 1.5-2 cases of regular soda PTA. He intends to eat more salads at discharge. RD to add double protein portions at meals to assist with hunger.   RD provided "Carbohydrate Counting for People with Diabetes" handout from the Academy of Nutrition and Dietetics. Discussed different food groups and their effects on blood sugar, emphasizing carbohydrate-containing foods. Provided list of carbohydrates and recommended serving sizes of common foods.  Discussed importance of controlled and consistent carbohydrate intake throughout the day. Provided examples of ways to balance meals/snacks and encouraged intake of high-fiber, whole grain complex carbohydrates. Teach back method used.  Expect fair compliance.   Current diet order is carb modified, patient is consuming approximately 100% of meals at this time. Labs and medications reviewed. No further nutrition interventions warranted at this time. RD contact information provided. If additional nutrition issues arise, please re-consult RD.  Levada Schilling, RD, LDN, CDCES Registered Dietitian II Certified Diabetes Care and Education Specialist Please refer to [facility] for RD and/or RD on-call/weekend/after hours pager

## 2020-12-27 NOTE — Consult Note (Signed)
[facility]    Date of Admission:  12/25/2020      Total days of antibiotics 17  Cefazolin        Reason for Consult: Vertebral infection with secondary bacteremia     Referring Provider: [facility]  Primary Care Provider: Patient, No Pcp Per (Inactive)     Assessment: Jeffrey Webb is a 45 y.o. male with known MSSA L2-3 discitis/osteomyelitis with myositis/psoas phlegmon who left the hospital AMA to attend to family needs after 17 days of treatment (day 1 5/23 based off first negative blood cultures). TTE was done at that time as well to investigate staph aureus bacteremia and negative for any vegetations or secondary findings concerning for endocarditis.   Clinically he seems to have improved. Inflammatory markers are trending down nicely. Leukocytosis resolved. He does not seem to have bacteremia, at least preliminarily from 6/11 cultures when he came back to ER for care. Recommend to continue Cefazolin through blood culture maturity.  He is not a candidate for home PICC line therapy d/t polysubstance abuse, but if he does not have any ongoing bacteremia here (would give cultures full time for maturity) and his back pain management can be reasonably controlled for outpatient care I think he would be a good candidate for long acting IV therapy with Oritavancin and continuation on a bioavailable oral option. He would be willing to stay longer inpatient but given his overall improvement I think it is reasonable to have him treated outpatient in our hospital infusion clinic. He understands this is a tentative recommendation pending further studies.   I do not feel strongly he needs repeat imaging at this time given good clinical response and improved inflammatory markers.     Plan: Continue cefazolin  Follow pending micro   Possible oritavancin candidate (tentative)     Principal Problem:   MSSA bacteremia Active Problems:   Discitis of lumbar  region   Substance abuse (HCC)   Plantar fasciitis, right   Diabetes mellitus type 2, uncontrolled (HCC)   Back pain   Phlegmon    baclofen  10 mg Oral TID   insulin aspart protamine- aspart  10 Units Subcutaneous BID WC   lidocaine  1 patch Transdermal Q24H   nicotine  21 mg Transdermal Daily   pantoprazole  40 mg Oral Daily   polyethylene glycol  17 g Oral BID   pregabalin  100 mg Oral TID   rivaroxaban  10 mg Oral Daily   senna-docusate  1 tablet Oral BID    HPI: Jeffrey Webb is a 45 y.o. male with known h/o MSSA bacteremia in the setting of L2-3 osteomyelitis/discitis and psoas muscle phlegmon who came back to the hospital for care after leaving AMA (previously hospitalized 5/21 - 6/09 for care and IV antibiotics). He was scheduled to complete 6 weeks of IV treatment July 4th.   The reason he left the hospital before was to check on a family member that had gunshot wound but always planned to come back. He states he is nervous about being off antibiotics since 6/9 since he had blood infection and worried he could die from that. He states that his pain level is currently a 3-4/10 but at re-admission 7/10. Feels that overall it is much improved. He has no changes to neurologic function. No fevers or chills. Still feels the right lower back is swollen but improved.   Repeat blood cultures from 6/11 are negative at  2 days.  UDS positive for cocaine (3 weeks ago and again at re-entry to hospital).     Review of Systems: Review of Systems  Constitutional:  Negative for chills, fever, malaise/fatigue and weight loss.  HENT:  Negative for sore throat.   Respiratory:  Negative for cough and sputum production.   Cardiovascular:  Negative for chest pain and leg swelling.  Gastrointestinal:  Negative for abdominal pain, diarrhea and vomiting.  Genitourinary:  Negative for dysuria and flank pain.  Musculoskeletal:  Positive for back pain. Negative for joint pain, myalgias and neck pain.   Skin:  Negative for rash.  Neurological:  Negative for dizziness, tingling and headaches.  Psychiatric/Behavioral:  Negative for depression and substance abuse. The patient is not nervous/anxious and does not have insomnia.    Past Medical History:  Diagnosis Date   Cocaine abuse (HCC)    Diabetes mellitus type 2, uncontrolled (HCC)    MSSA bacteremia 12/04/2020   Tobacco abuse     Social History   Tobacco Use   Smoking status: Some Days    Pack years: 0.00   Smokeless tobacco: Never  Substance Use Topics   Alcohol use: Not Currently   Drug use: Not Currently    History reviewed. No pertinent family history. No Known Allergies  OBJECTIVE: Blood pressure 98/68, pulse 68, temperature 98.5 F (36.9 C), temperature source Oral, resp. rate 18, height 5\' 6"  (1.676 m), weight 82.1 kg, SpO2 98 %.  Physical Exam Vitals reviewed.  Constitutional:      Appearance: He is well-developed.     Comments: Seated comfortably in chair during visit.   HENT:     Mouth/Throat:     Dentition: Normal dentition. No dental abscesses.  Cardiovascular:     Rate and Rhythm: Normal rate and regular rhythm.     Heart sounds: Normal heart sounds.  Pulmonary:     Effort: Pulmonary effort is normal.     Breath sounds: Normal breath sounds.  Abdominal:     General: There is no distension.     Palpations: Abdomen is soft.     Tenderness: There is no abdominal tenderness.  Musculoskeletal:     Lumbar back: Edema (R lower back spinal musculature at iliac crest) and bony tenderness (mild, was able to tolerate percussion over spine) present.  Lymphadenopathy:     Cervical: No cervical adenopathy.  Skin:    General: Skin is warm and dry.     Findings: No rash.  Neurological:     Mental Status: He is alert and oriented to person, place, and time.  Psychiatric:        Judgment: Judgment normal.     Comments: In good spirits today and engaged in care discussion.     Lab Results Lab Results   Component Value Date   WBC 7.9 12/25/2020   HGB 11.7 (L) 12/25/2020   HCT 37.4 (L) 12/25/2020   MCV 83.7 12/25/2020   PLT 349 12/25/2020    Lab Results  Component Value Date   CREATININE 0.94 12/25/2020   BUN 11 12/25/2020   NA 138 12/25/2020   K 3.6 12/25/2020   CL 106 12/25/2020   CO2 24 12/25/2020    Lab Results  Component Value Date   ALT 15 12/25/2020   AST 26 12/25/2020   ALKPHOS 104 12/25/2020   BILITOT 0.6 12/25/2020     Microbiology: Recent Results (from the past 240 hour(s))  Resp Panel by RT-PCR (Flu A&B, Covid) Nasopharyngeal Swab  Status: None   Collection Time: 12/25/20  8:13 AM   Specimen: Nasopharyngeal Swab; Nasopharyngeal(NP) swabs in vial transport medium  Result Value Ref Range Status   SARS Coronavirus 2 by RT PCR NEGATIVE NEGATIVE Final    Comment: (NOTE) SARS-CoV-2 target nucleic acids are NOT DETECTED.  The SARS-CoV-2 RNA is generally detectable in upper respiratory specimens during the acute phase of infection. The lowest concentration of SARS-CoV-2 viral copies this assay can detect is 138 copies/mL. A negative result does not preclude SARS-Cov-2 infection and should not be used as the sole basis for treatment or other patient management decisions. A negative result may occur with  improper specimen collection/handling, submission of specimen other than nasopharyngeal swab, presence of viral mutation(s) within the areas targeted by this assay, and inadequate number of viral copies(<138 copies/mL). A negative result must be combined with clinical observations, patient history, and epidemiological information. The expected result is Negative.  Fact Sheet for Patients:  BloggerCourse.com  Fact Sheet for Healthcare Providers:  SeriousBroker.it  This test is no t yet approved or cleared by the [place] FDA and  has been authorized for detection and/or diagnosis of SARS-CoV-2 by FDA  under an Emergency Use Authorization (EUA). This EUA will remain  in effect (meaning this test can be used) for the duration of the COVID-19 declaration under Section 564(b)(1) of the Act, 21 U.S.C.section 360bbb-3(b)(1), unless the authorization is terminated  or revoked sooner.       Influenza A by PCR NEGATIVE NEGATIVE Final   Influenza B by PCR NEGATIVE NEGATIVE Final    Comment: (NOTE) The Xpert Xpress SARS-CoV-2/FLU/RSV plus assay is intended as an aid in the diagnosis of influenza from Nasopharyngeal swab specimens and should not be used as a sole basis for treatment. Nasal washings and aspirates are unacceptable for Xpert Xpress SARS-CoV-2/FLU/RSV testing.  Fact Sheet for Patients: BloggerCourse.com  Fact Sheet for Healthcare Providers: SeriousBroker.it  This test is not yet approved or cleared by the [place] FDA and has been authorized for detection and/or diagnosis of SARS-CoV-2 by FDA under an Emergency Use Authorization (EUA). This EUA will remain in effect (meaning this test can be used) for the duration of the COVID-19 declaration under Section 564(b)(1) of the Act, 21 U.S.C. section 360bbb-3(b)(1), unless the authorization is terminated or revoked.  Performed at [facility], [address] [place], [place] [address]   Blood culture (routine x 2)     Status: None (Preliminary result)   Collection Time: 12/25/20  8:17 AM   Specimen: BLOOD RIGHT FOREARM  Result Value Ref Range Status   Specimen Description BLOOD RIGHT FOREARM  Final   Special Requests   Final    BOTTLES DRAWN AEROBIC AND ANAEROBIC Blood Culture adequate volume   Culture   Final    NO GROWTH 2 DAYS Performed at [facility], [address] [place], [place] [address]    Report Status PENDING  Incomplete  Blood culture (routine x 2)     Status: None (Preliminary result)   Collection Time: 12/25/20  8:22 AM   Specimen: BLOOD  RIGHT HAND  Result Value Ref Range Status   Specimen Description BLOOD RIGHT HAND  Final   Special Requests   Final    BOTTLES DRAWN AEROBIC AND ANAEROBIC Blood Culture adequate volume   Culture   Final    NO GROWTH 2 DAYS Performed at [facility], [address], [place], [place] [address]  Report Status PENDING  Incomplete    Rexene AlbertsStephanie Loras Grieshop, MSN, NP-C [facility] Cell: [phone] Pager: [phone]  12/27/2020 1:37 PM

## 2020-12-27 NOTE — Progress Notes (Signed)
PROGRESS NOTE    Domingos Riggi  OMB:[id number] DOB: 07-26-1975 DOA: 12/25/2020 PCP: Patient, No Pcp Per (Inactive)   Brief Narrative: Nyjah Denio is a 45 y.o. male with medical history significant of recent hospitalization with MSSA bacteremia, lumbar osteomyelitis/discitis, concern for left psoas muscle phlegmon newly diagnosed diabetes mellitus type 2, and polysubstance abuse including cocaine and tobacco. Patient presented to resume IV antibiotic treatment for bacteremia/osteomyelitis/discitis.   Assessment & Plan:   Principal Problem:   MSSA bacteremia Active Problems:   Discitis of lumbar region   Substance abuse (HCC)   Plantar fasciitis, right   Diabetes mellitus type 2, uncontrolled (HCC)   Back pain   Phlegmon   MSSA bacteremia L2-3 Discitis/osteomyelitis L2-3 myositis with phlegmon Patient was previously admitted and being treated with Cefazolin IV. ID at that time recommended a 6 course of IV antibiotics with end date of July 4th and recommendation for repeat MRI lumbar spine after antibiotics. -Continue Cefazolin; ID consult in AM to verify no changes to management -Oxycodone, Lyrica prn for pain -Continue Baclofen with plan for taper -Infectious disease re-consulted for further recommendations as needed  Abdominal discomfort Constipation Secondary to narcotics -Senokot-S, Miralax BID  Plantar fasciitis Outpatient orthopedic surgery follow-up  Diabetes mellitus, type 2 -Continue Novolog 70/30 10 units BID; titrate for blood sugar control -Dietitian consult for education  Polysubstance abuse Cocaine positive on last admission. Ongoing tobacco use. -Continue nicotine patch   DVT prophylaxis: Xarelto (VTE dose) Code Status:   Code Status: Full Code Family Communication: None at bedside Disposition Plan: Discharge home pending completion of IV antibiotics   Consultants:  Infectious disease  Procedures:  None  Antimicrobials: Cefazolin     Subjective: Pain is managed. Concerned about the lack of food he can order.  Objective: Vitals:   12/25/20 2202 12/26/20 1322 12/26/20 2221 12/27/20 0725  BP: 116/84 113/71 103/69 98/68  Pulse: 69 77 84 68  Resp: 16 18 18 18   Temp: 99 F (37.2 C) 99.1 F (37.3 C) 98.9 F (37.2 C) 98.5 F (36.9 C)  TempSrc:  Oral Oral Oral  SpO2: 100% 100% 100% 98%  Weight:      Height:        Intake/Output Summary (Last 24 hours) at 12/27/2020 1402 Last data filed at 12/27/2020 0900 Gross per 24 hour  Intake 340.4 ml  Output --  Net 340.4 ml    Filed Weights   12/25/20 0018  Weight: 82.1 kg    Examination:  General exam: Appears calm and comfortable Respiratory system: Clear to auscultation. Respiratory effort normal. Cardiovascular system: S1 & S2 heard, RRR. No murmurs, rubs, gallops or clicks. Gastrointestinal system: Abdomen is nondistended, soft and nontender. No organomegaly or masses felt. Normal bowel sounds heard. Central nervous system: Alert and oriented. No focal neurological deficits. Musculoskeletal: No edema. No calf tenderness Skin: No cyanosis. No rashes Psychiatry: Judgement and insight appear normal. Mood & affect appropriate.     Data Reviewed: I have personally reviewed following labs and imaging studies  CBC Lab Results  Component Value Date   WBC 7.9 12/25/2020   RBC 4.47 12/25/2020   HGB 11.7 (L) 12/25/2020   HCT 37.4 (L) 12/25/2020   MCV 83.7 12/25/2020   MCH 26.2 12/25/2020   PLT 349 12/25/2020   MCHC 31.3 12/25/2020   RDW 14.1 12/25/2020   LYMPHSABS 2.1 12/18/2020   MONOABS 0.8 12/18/2020   EOSABS 0.1 12/18/2020   BASOSABS 0.1 12/18/2020     Last metabolic panel Lab  Results  Component Value Date   NA 138 12/25/2020   K 3.6 12/25/2020   CL 106 12/25/2020   CO2 24 12/25/2020   BUN 11 12/25/2020   CREATININE 0.94 12/25/2020   GLUCOSE 208 (H) 12/25/2020   GFRNONAA >60 12/25/2020   CALCIUM 8.9 12/25/2020   PHOS 4.2 12/05/2020    PROT 7.0 12/25/2020   ALBUMIN 3.1 (L) 12/25/2020   BILITOT 0.6 12/25/2020   ALKPHOS 104 12/25/2020   AST 26 12/25/2020   ALT 15 12/25/2020   ANIONGAP 8 12/25/2020    CBG (last 3)  Recent Labs    12/26/20 2218 12/27/20 0958 12/27/20 1135  GLUCAP 209* 251* 196*      GFR: Estimated Creatinine Clearance: 100.9 mL/min (by C-G formula based on SCr of 0.94 mg/dL).  Coagulation Profile: No results for input(s): INR, PROTIME in the last 168 hours.  Recent Results (from the past 240 hour(s))  Resp Panel by RT-PCR (Flu A&B, Covid) Nasopharyngeal Swab     Status: None   Collection Time: 12/25/20  8:13 AM   Specimen: Nasopharyngeal Swab; Nasopharyngeal(NP) swabs in vial transport medium  Result Value Ref Range Status   SARS Coronavirus 2 by RT PCR NEGATIVE NEGATIVE Final    Comment: (NOTE) SARS-CoV-2 target nucleic acids are NOT DETECTED.  The SARS-CoV-2 RNA is generally detectable in upper respiratory specimens during the acute phase of infection. The lowest concentration of SARS-CoV-2 viral copies this assay can detect is 138 copies/mL. A negative result does not preclude SARS-Cov-2 infection and should not be used as the sole basis for treatment or other patient management decisions. A negative result may occur with  improper specimen collection/handling, submission of specimen other than nasopharyngeal swab, presence of viral mutation(s) within the areas targeted by this assay, and inadequate number of viral copies(<138 copies/mL). A negative result must be combined with clinical observations, patient history, and epidemiological information. The expected result is Negative.  Fact Sheet for Patients:  BloggerCourse.com  Fact Sheet for Healthcare Providers:  SeriousBroker.it  This test is no t yet approved or cleared by the [place] FDA and  has been authorized for detection and/or diagnosis of SARS-CoV-2 by FDA under  an Emergency Use Authorization (EUA). This EUA will remain  in effect (meaning this test can be used) for the duration of the COVID-19 declaration under Section 564(b)(1) of the Act, 21 U.S.C.section 360bbb-3(b)(1), unless the authorization is terminated  or revoked sooner.       Influenza A by PCR NEGATIVE NEGATIVE Final   Influenza B by PCR NEGATIVE NEGATIVE Final    Comment: (NOTE) The Xpert Xpress SARS-CoV-2/FLU/RSV plus assay is intended as an aid in the diagnosis of influenza from Nasopharyngeal swab specimens and should not be used as a sole basis for treatment. Nasal washings and aspirates are unacceptable for Xpert Xpress SARS-CoV-2/FLU/RSV testing.  Fact Sheet for Patients: BloggerCourse.com  Fact Sheet for Healthcare Providers: SeriousBroker.it  This test is not yet approved or cleared by the [place] FDA and has been authorized for detection and/or diagnosis of SARS-CoV-2 by FDA under an Emergency Use Authorization (EUA). This EUA will remain in effect (meaning this test can be used) for the duration of the COVID-19 declaration under Section 564(b)(1) of the Act, 21 U.S.C. section 360bbb-3(b)(1), unless the authorization is terminated or revoked.  Performed at [facility], [address], [place], [place] [address]   Blood culture (routine x 2)     Status: None (Preliminary result)  Collection Time: 12/25/20  8:17 AM   Specimen: BLOOD RIGHT FOREARM  Result Value Ref Range Status   Specimen Description BLOOD RIGHT FOREARM  Final   Special Requests   Final    BOTTLES DRAWN AEROBIC AND ANAEROBIC Blood Culture adequate volume   Culture   Final    NO GROWTH 2 DAYS Performed at [facility], [address] [place], [place] [address]    Report Status PENDING  Incomplete  Blood culture (routine x 2)     Status: None (Preliminary result)   Collection Time: 12/25/20  8:22 AM   Specimen: BLOOD RIGHT  HAND  Result Value Ref Range Status   Specimen Description BLOOD RIGHT HAND  Final   Special Requests   Final    BOTTLES DRAWN AEROBIC AND ANAEROBIC Blood Culture adequate volume   Culture   Final    NO GROWTH 2 DAYS Performed at [facility], [address], [place], [place] [address]    Report Status PENDING  Incomplete         Radiology Studies: No results found.      Scheduled Meds:  baclofen  10 mg Oral TID   insulin aspart protamine- aspart  10 Units Subcutaneous BID WC   lidocaine  1 patch Transdermal Q24H   nicotine  21 mg Transdermal Daily   pantoprazole  40 mg Oral Daily   polyethylene glycol  17 g Oral BID   pregabalin  100 mg Oral TID   rivaroxaban  10 mg Oral Daily   senna-docusate  1 tablet Oral BID   Continuous Infusions:   ceFAZolin (ANCEF) IV 2 g (12/27/20 1029)     LOS: 2 days     Jacquelin Hawking, MD [facility] 12/27/2020, 2:02 PM  If 7PM-7AM, please contact night-coverage [email or web address]

## 2020-12-27 NOTE — Discharge Instructions (Signed)

## 2020-12-28 LAB — CBC
HCT: 39.2 % (ref 39.0–52.0)
Hemoglobin: 12.2 g/dL — ABNORMAL LOW (ref 13.0–17.0)
MCH: 26.1 pg (ref 26.0–34.0)
MCHC: 31.1 g/dL (ref 30.0–36.0)
MCV: 83.8 fL (ref 80.0–100.0)
Platelets: 364 10*3/uL (ref 150–400)
RBC: 4.68 MIL/uL (ref 4.22–5.81)
RDW: 14 % (ref 11.5–15.5)
WBC: 6.2 10*3/uL (ref 4.0–10.5)
nRBC: 0 % (ref 0.0–0.2)

## 2020-12-28 LAB — GLUCOSE, CAPILLARY
Glucose-Capillary: 208 mg/dL — ABNORMAL HIGH (ref 70–99)
Glucose-Capillary: 218 mg/dL — ABNORMAL HIGH (ref 70–99)
Glucose-Capillary: 217 mg/dL — ABNORMAL HIGH (ref 70–99)
Glucose-Capillary: 161 mg/dL — ABNORMAL HIGH (ref 70–99)

## 2020-12-28 MED ORDER — INSULIN ASPART PROT & ASPART (70-30 MIX) 100 UNIT/ML ~~LOC~~ SUSP
15.0000 [IU] | Freq: Two times a day (BID) | SUBCUTANEOUS | Status: DC
  Administered 2020-12-28 – 2020-12-29 (×3): 15 [IU] via SUBCUTANEOUS

## 2020-12-28 NOTE — Progress Notes (Addendum)
PROGRESS NOTE    Jeffrey Webb  TMH:[id number] DOB: February 12, 1976 DOA: 12/25/2020 PCP: Patient, No Pcp Per (Inactive)   Brief Narrative: Jeffrey Webb is a 45 y.o. male with medical history significant of recent hospitalization with MSSA bacteremia, lumbar osteomyelitis/discitis, concern for left psoas muscle phlegmon newly diagnosed diabetes mellitus type 2, and polysubstance abuse including cocaine and tobacco. Patient presented to resume IV antibiotic treatment for bacteremia/osteomyelitis/discitis.   Assessment & Plan:   Principal Problem:   MSSA bacteremia Active Problems:   Discitis of lumbar region   Substance abuse (HCC)   Plantar fasciitis, right   Diabetes mellitus type 2, uncontrolled (HCC)   Back pain   Phlegmon   MSSA bacteremia L2-3 Discitis/osteomyelitis L2-3 myositis with phlegmon Patient was previously admitted and being treated with Cefazolin IV. ID at that time recommended a 6 course of IV antibiotics with end date of July 4th and recommendation for repeat MRI lumbar spine after antibiotics. -Continue Cefazolin; ID consult in AM to verify no changes to management -Oxycodone, Lyrica prn for pain -Continue Baclofen with plan for taper -Infectious disease recommendations: consideration for oritavancin IV as an outpatient  Abdominal discomfort Constipation Secondary to narcotics -Senokot-S, Miralax BID  Plantar fasciitis Outpatient orthopedic surgery follow-up  Diabetes mellitus, type 2 Dietitian consulted for diet education.  -Increase to  Novolog 70/30 15 units BID; titrate for blood sugar control  Polysubstance abuse Cocaine positive on last admission. Ongoing tobacco use. -Continue nicotine patch   DVT prophylaxis: Xarelto (VTE dose) Code Status:   Code Status: Full Code Family Communication: None at bedside Disposition Plan: Discharge home pending completion of IV antibiotics vs transition to outpatient IV infusions of oritavancin. Not a candidate  for discharge with PICC line secondary to drug use.   Consultants:  Infectious disease  Procedures:  None  Antimicrobials: Cefazolin    Subjective: No concerns today.  Objective: Vitals:   12/27/20 0725 12/27/20 1535 12/27/20 2155 12/28/20 0758  BP: 98/68 135/82 99/60 (!) 113/99  Pulse: 68 64 86 71  Resp: 18 18 16 17   Temp: 98.5 F (36.9 C) 98.1 F (36.7 C) 98.5 F (36.9 C) 98.9 F (37.2 C)  TempSrc: Oral Oral Oral Oral  SpO2: 98% 100% 100% 97%  Weight:      Height:        Intake/Output Summary (Last 24 hours) at 12/28/2020 1349 Last data filed at 12/28/2020 0900 Gross per 24 hour  Intake 840 ml  Output --  Net 840 ml    Filed Weights   12/25/20 0018  Weight: 82.1 kg    Examination:  General exam: Appears calm and comfortable Respiratory system: Clear to auscultation. Respiratory effort normal. Cardiovascular system: S1 & S2 heard, RRR. No murmurs, rubs, gallops or clicks. Gastrointestinal system: Abdomen is slightly distended, soft and nontender. No organomegaly or masses felt. Normal bowel sounds heard. Central nervous system: Alert and oriented. No focal neurological deficits. Musculoskeletal: No edema. No calf tenderness Skin: No cyanosis. No rashes Psychiatry: Judgement and insight appear normal. Mood & affect appropriate.     Data Reviewed: I have personally reviewed following labs and imaging studies  CBC Lab Results  Component Value Date   WBC 6.2 12/28/2020   RBC 4.68 12/28/2020   HGB 12.2 (L) 12/28/2020   HCT 39.2 12/28/2020   MCV 83.8 12/28/2020   MCH 26.1 12/28/2020   PLT 364 12/28/2020   MCHC 31.1 12/28/2020   RDW 14.0 12/28/2020   LYMPHSABS 2.1 12/18/2020   MONOABS 0.8 12/18/2020  EOSABS 0.1 12/18/2020   BASOSABS 0.1 12/18/2020     Last metabolic panel Lab Results  Component Value Date   NA 138 12/25/2020   K 3.6 12/25/2020   CL 106 12/25/2020   CO2 24 12/25/2020   BUN 11 12/25/2020   CREATININE 0.94 12/25/2020    GLUCOSE 208 (H) 12/25/2020   GFRNONAA >60 12/25/2020   CALCIUM 8.9 12/25/2020   PHOS 4.2 12/05/2020   PROT 7.0 12/25/2020   ALBUMIN 3.1 (L) 12/25/2020   BILITOT 0.6 12/25/2020   ALKPHOS 104 12/25/2020   AST 26 12/25/2020   ALT 15 12/25/2020   ANIONGAP 8 12/25/2020    CBG (last 3)  Recent Labs    12/27/20 2156 12/28/20 0547 12/28/20 1226  GLUCAP 240* 208* 218*      GFR: Estimated Creatinine Clearance: 100.9 mL/min (by C-G formula based on SCr of 0.94 mg/dL).  Coagulation Profile: No results for input(s): INR, PROTIME in the last 168 hours.  Recent Results (from the past 240 hour(s))  Resp Panel by RT-PCR (Flu A&B, Covid) Nasopharyngeal Swab     Status: None   Collection Time: 12/25/20  8:13 AM   Specimen: Nasopharyngeal Swab; Nasopharyngeal(NP) swabs in vial transport medium  Result Value Ref Range Status   SARS Coronavirus 2 by RT PCR NEGATIVE NEGATIVE Final    Comment: (NOTE) SARS-CoV-2 target nucleic acids are NOT DETECTED.  The SARS-CoV-2 RNA is generally detectable in upper respiratory specimens during the acute phase of infection. The lowest concentration of SARS-CoV-2 viral copies this assay can detect is 138 copies/mL. A negative result does not preclude SARS-Cov-2 infection and should not be used as the sole basis for treatment or other patient management decisions. A negative result may occur with  improper specimen collection/handling, submission of specimen other than nasopharyngeal swab, presence of viral mutation(s) within the areas targeted by this assay, and inadequate number of viral copies(<138 copies/mL). A negative result must be combined with clinical observations, patient history, and epidemiological information. The expected result is Negative.  Fact Sheet for Patients:  BloggerCourse.com  Fact Sheet for Healthcare Providers:  SeriousBroker.it  This test is no t yet approved or cleared by  the [place] FDA and  has been authorized for detection and/or diagnosis of SARS-CoV-2 by FDA under an Emergency Use Authorization (EUA). This EUA will remain  in effect (meaning this test can be used) for the duration of the COVID-19 declaration under Section 564(b)(1) of the Act, 21 U.S.C.section 360bbb-3(b)(1), unless the authorization is terminated  or revoked sooner.       Influenza A by PCR NEGATIVE NEGATIVE Final   Influenza B by PCR NEGATIVE NEGATIVE Final    Comment: (NOTE) The Xpert Xpress SARS-CoV-2/FLU/RSV plus assay is intended as an aid in the diagnosis of influenza from Nasopharyngeal swab specimens and should not be used as a sole basis for treatment. Nasal washings and aspirates are unacceptable for Xpert Xpress SARS-CoV-2/FLU/RSV testing.  Fact Sheet for Patients: BloggerCourse.com  Fact Sheet for Healthcare Providers: SeriousBroker.it  This test is not yet approved or cleared by the [place] FDA and has been authorized for detection and/or diagnosis of SARS-CoV-2 by FDA under an Emergency Use Authorization (EUA). This EUA will remain in effect (meaning this test can be used) for the duration of the COVID-19 declaration under Section 564(b)(1) of the Act, 21 U.S.C. section 360bbb-3(b)(1), unless the authorization is terminated or revoked.  Performed at [facility], [address], [place], [place] [address]  Blood culture (routine x 2)     Status: None (Preliminary result)   Collection Time: 12/25/20  8:17 AM   Specimen: BLOOD RIGHT FOREARM  Result Value Ref Range Status   Specimen Description BLOOD RIGHT FOREARM  Final   Special Requests   Final    BOTTLES DRAWN AEROBIC AND ANAEROBIC Blood Culture adequate volume   Culture   Final    NO GROWTH 3 DAYS Performed at [facility], [address] [place], [place] [address]    Report Status PENDING  Incomplete  Blood culture (routine  x 2)     Status: None (Preliminary result)   Collection Time: 12/25/20  8:22 AM   Specimen: BLOOD RIGHT HAND  Result Value Ref Range Status   Specimen Description BLOOD RIGHT HAND  Final   Special Requests   Final    BOTTLES DRAWN AEROBIC AND ANAEROBIC Blood Culture adequate volume   Culture   Final    NO GROWTH 3 DAYS Performed at [facility], [address] [place], [place] [address]    Report Status PENDING  Incomplete         Radiology Studies: No results found.      Scheduled Meds:  baclofen  10 mg Oral TID   insulin aspart protamine- aspart  10 Units Subcutaneous BID WC   lidocaine  1 patch Transdermal Q24H   nicotine  21 mg Transdermal Daily   pantoprazole  40 mg Oral Daily   polyethylene glycol  17 g Oral BID   pregabalin  100 mg Oral TID   rivaroxaban  10 mg Oral Daily   senna-docusate  1 tablet Oral BID   Continuous Infusions:   ceFAZolin (ANCEF) IV 2 g (12/28/20 1035)     LOS: 3 days     Jacquelin Hawking, MD [facility] 12/28/2020, 1:49 PM  If 7PM-7AM, please contact night-coverage [email or web address]

## 2020-12-29 ENCOUNTER — Inpatient Hospital Stay (HOSPITAL_COMMUNITY): Payer: Self-pay

## 2020-12-29 ENCOUNTER — Other Ambulatory Visit (HOSPITAL_COMMUNITY): Payer: Self-pay

## 2020-12-29 LAB — COMPREHENSIVE METABOLIC PANEL
ALT: 13 U/L (ref 0–44)
AST: 20 U/L (ref 15–41)
Albumin: 3 g/dL — ABNORMAL LOW (ref 3.5–5.0)
Alkaline Phosphatase: 105 U/L (ref 38–126)
Anion gap: 7 (ref 5–15)
BUN: 12 mg/dL (ref 6–20)
CO2: 24 mmol/L (ref 22–32)
Calcium: 8.9 mg/dL (ref 8.9–10.3)
Chloride: 104 mmol/L (ref 98–111)
Creatinine, Ser: 1.01 mg/dL (ref 0.61–1.24)
GFR, Estimated: >60 mL/min (ref >60)
Glucose, Bld: 247 mg/dL — ABNORMAL HIGH (ref 70–99)
Potassium: 4.1 mmol/L (ref 3.5–5.1)
Sodium: 135 mmol/L (ref 135–145)
Total Bilirubin: 0.3 mg/dL (ref 0.3–1.2)
Total Protein: 7.1 g/dL (ref 6.5–8.1)

## 2020-12-29 LAB — GLUCOSE, CAPILLARY
Glucose-Capillary: 244 mg/dL — ABNORMAL HIGH (ref 70–99)
Glucose-Capillary: 134 mg/dL — ABNORMAL HIGH (ref 70–99)
Glucose-Capillary: 281 mg/dL — ABNORMAL HIGH (ref 70–99)

## 2020-12-29 MED ORDER — METFORMIN HCL 500 MG PO TABS
500.0000 mg | ORAL_TABLET | Freq: Two times a day (BID) | ORAL | 0 refills | Status: DC
  Filled 2020-12-29: qty 60, 30d supply, fill #0

## 2020-12-29 MED ORDER — OXYCODONE HCL 15 MG PO TABS
15.0000 mg | ORAL_TABLET | Freq: Four times a day (QID) | ORAL | 0 refills | Status: AC | PRN
  Filled 2020-12-29: qty 20, 5d supply, fill #0

## 2020-12-29 MED ORDER — SENNOSIDES-DOCUSATE SODIUM 8.6-50 MG PO TABS
1.0000 | ORAL_TABLET | Freq: Two times a day (BID) | ORAL | 0 refills | Status: AC
  Filled 2020-12-29: qty 10, 5d supply, fill #0

## 2020-12-29 MED ORDER — NOVOLIN 70/30 FLEXPEN (70-30) 100 UNIT/ML ~~LOC~~ SUPN
20.0000 [IU] | PEN_INJECTOR | Freq: Two times a day (BID) | SUBCUTANEOUS | 2 refills | Status: DC
  Filled 2020-12-29 – 2021-05-17 (×2): qty 12, 30d supply, fill #0

## 2020-12-29 MED ORDER — ORITAVANCIN DIPHOSPHATE 400 MG IV SOLR
1200.0000 mg | Freq: Once | INTRAVENOUS | Status: AC
  Administered 2020-12-29: 1200 mg via INTRAVENOUS
  Filled 2020-12-29: qty 120

## 2020-12-29 MED ORDER — GLUCOSE BLOOD VI STRP
ORAL_STRIP | 0 refills | Status: DC
  Filled 2020-12-29: qty 100, 30d supply, fill #0

## 2020-12-29 MED ORDER — POLYETHYLENE GLYCOL 3350 17 GM/SCOOP PO POWD
17.0000 g | Freq: Two times a day (BID) | ORAL | 0 refills | Status: DC
  Filled 2020-12-29: qty 510, 15d supply, fill #0

## 2020-12-29 MED ORDER — INSULIN PEN NEEDLE 32G X 4 MM MISC
2 refills | Status: DC
  Filled 2020-12-29 – 2021-05-17 (×2): qty 100, 30d supply, fill #0

## 2020-12-29 MED ORDER — ACCU-CHEK GUIDE W/DEVICE KIT
PACK | 0 refills | Status: DC
  Filled 2020-12-29: qty 1, 1d supply, fill #0

## 2020-12-29 MED ORDER — ACCU-CHEK SOFTCLIX LANCETS MISC
0 refills | Status: DC
  Filled 2020-12-29: qty 100, 30d supply, fill #0

## 2020-12-29 MED ORDER — BACLOFEN 10 MG PO TABS
5.0000 mg | ORAL_TABLET | Freq: Two times a day (BID) | ORAL | Status: DC
  Administered 2020-12-29: 5 mg via ORAL
  Filled 2020-12-29: qty 1

## 2020-12-29 MED ORDER — PREGABALIN 100 MG PO CAPS
100.0000 mg | ORAL_CAPSULE | Freq: Three times a day (TID) | ORAL | 0 refills | Status: AC
  Filled 2020-12-29: qty 90, 30d supply, fill #0

## 2020-12-29 NOTE — Progress Notes (Signed)
[facility]   Reason for visit: Follow up on lumbar osteomyelitis/discitis  Interval History: repeat blood cultures remain negative at 4 days; WBC wnl yesterday, remains afebrile.  Feels well overall.  Asking about pain medications.  Day 25 total antibiotics  Physical Exam: Constitutional:  Vitals:   12/29/20 0232 12/29/20 0730  BP: 109/75 107/76  Pulse: 76 69  Resp: 17 17  Temp: 98.1 F (36.7 C) 98.4 F (36.9 C)  SpO2: 100% 100%   patient appears in NAD Respiratory: Normal respiratory effort; CTA B Cardiovascular: RRR GI: soft, nt, nd  Review of Systems: Constitutional: negative for fevers and chills Gastrointestinal: negative for diarrhea  Lab Results  Component Value Date   WBC 6.2 12/28/2020   HGB 12.2 (L) 12/28/2020   HCT 39.2 12/28/2020   MCV 83.8 12/28/2020   PLT 364 12/28/2020    Lab Results  Component Value Date   CREATININE 1.01 12/29/2020   BUN 12 12/29/2020   NA 135 12/29/2020   K 4.1 12/29/2020   CL 104 12/29/2020   CO2 24 12/29/2020    Lab Results  Component Value Date   ALT 13 12/29/2020   AST 20 12/29/2020   ALKPHOS 105 12/29/2020     Microbiology: Recent Results (from the past 240 hour(s))  Resp Panel by RT-PCR (Flu A&B, Covid) Nasopharyngeal Swab     Status: None   Collection Time: 12/25/20  8:13 AM   Specimen: Nasopharyngeal Swab; Nasopharyngeal(NP) swabs in vial transport medium  Result Value Ref Range Status   SARS Coronavirus 2 by RT PCR NEGATIVE NEGATIVE Final    Comment: (NOTE) SARS-CoV-2 target nucleic acids are NOT DETECTED.  The SARS-CoV-2 RNA is generally detectable in upper respiratory specimens during the acute phase of infection. The lowest concentration of SARS-CoV-2 viral copies this assay can detect is 138 copies/mL. A negative result does not preclude SARS-Cov-2 infection and should not be used as the sole basis for treatment or other patient management decisions. A negative result may  occur with  improper specimen collection/handling, submission of specimen other than nasopharyngeal swab, presence of viral mutation(s) within the areas targeted by this assay, and inadequate number of viral copies(<138 copies/mL). A negative result must be combined with clinical observations, patient history, and epidemiological information. The expected result is Negative.  Fact Sheet for Patients:  BloggerCourse.com  Fact Sheet for Healthcare Providers:  SeriousBroker.it  This test is no t yet approved or cleared by the [place] FDA and  has been authorized for detection and/or diagnosis of SARS-CoV-2 by FDA under an Emergency Use Authorization (EUA). This EUA will remain  in effect (meaning this test can be used) for the duration of the COVID-19 declaration under Section 564(b)(1) of the Act, 21 U.S.C.section 360bbb-3(b)(1), unless the authorization is terminated  or revoked sooner.       Influenza A by PCR NEGATIVE NEGATIVE Final   Influenza B by PCR NEGATIVE NEGATIVE Final    Comment: (NOTE) The Xpert Xpress SARS-CoV-2/FLU/RSV plus assay is intended as an aid in the diagnosis of influenza from Nasopharyngeal swab specimens and should not be used as a sole basis for treatment. Nasal washings and aspirates are unacceptable for Xpert Xpress SARS-CoV-2/FLU/RSV testing.  Fact Sheet for Patients: BloggerCourse.com  Fact Sheet for Healthcare Providers: SeriousBroker.it  This test is not yet approved or cleared by the [place] FDA and has been authorized for detection and/or diagnosis of SARS-CoV-2 by FDA under an Emergency Use Authorization (EUA).  This EUA will remain in effect (meaning this test can be used) for the duration of the COVID-19 declaration under Section 564(b)(1) of the Act, 21 U.S.C. section 360bbb-3(b)(1), unless the authorization is terminated  or revoked.  Performed at [facility], [address], [place], [place] [address]   Blood culture (routine x 2)     Status: None (Preliminary result)   Collection Time: 12/25/20  8:17 AM   Specimen: BLOOD RIGHT FOREARM  Result Value Ref Range Status   Specimen Description BLOOD RIGHT FOREARM  Final   Special Requests   Final    BOTTLES DRAWN AEROBIC AND ANAEROBIC Blood Culture adequate volume   Culture   Final    NO GROWTH 4 DAYS Performed at [facility], [address] [place], [place] [address]    Report Status PENDING  Incomplete  Blood culture (routine x 2)     Status: None (Preliminary result)   Collection Time: 12/25/20  8:22 AM   Specimen: BLOOD RIGHT HAND  Result Value Ref Range Status   Specimen Description BLOOD RIGHT HAND  Final   Special Requests   Final    BOTTLES DRAWN AEROBIC AND ANAEROBIC Blood Culture adequate volume   Culture   Final    NO GROWTH 4 DAYS Performed at [facility], [address] [place], [place] [address]    Report Status PENDING  Incomplete    Impression/Plan:  1. Discitis/osteomyelitis - L2-3 level without phelgmon/abscess.  MSSA in blood cultures.  On cefazolin and at this point I think it is reasonable since he is stable, to give a dose of oritavancin and he can follow up with me in about 2 weeks.  Will order this and stop the cefazolin  2.  Bacteremia - no further positive blood cultures.  TTE negative for any vegetation.  He will be on prolonged antibiotics so no TEE indicated.    3.  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  No further clinical concerns in this area at this time.    He has follow up arranged with Dr. Elinor Parkinson on 7/6 and can check inflammatory markers then and consider oral therapy if indicated.    Ok from ID standpoint for discharge once the oritavancin has been completed.  This has been ordered today.    I will sign off, call with questions

## 2020-12-29 NOTE — Discharge Summary (Signed)
Physician Discharge Summary  Jeffrey Webb OKH:[id number] DOB: 07/27/1975 DOA: 12/25/2020  PCP: Patient, No Pcp Per (Inactive)  Admit date: 12/25/2020 Discharge date: 12/29/2020  Admitted From: Home Disposition: Home  Recommendations for Outpatient Follow-up:  Follow up with PCP in 1 week (needs to establish care) Follow up with ID on 01/19/2021 Please follow up on the following pending results: None  Home Health: None Equipment/Devices: None  Discharge Condition: Stable CODE STATUS: Full code Diet recommendation: Carb modified   Brief/Interim Summary:  Admission HPI written by Clydie Braun, MD   HPI: Jeffrey Webb is a 45 y.o. male with medical history significant of recent hospitalization with MSSA bacteremia, lumbar osteomyelitis/discitis, concern for left psoas muscle phlegmon newly diagnosed diabetes mellitus type 2, and polysubstance abuse including cocaine and tobacco.  Patient had initially been admitted to the hospitalist service on 5/21 and was here up until 6/9 when patient left AGAINST MEDICAL ADVICE receiving IV antibiotics except:Marland Kitchen  During his hospitalization he had been evaluated by neurosurgery, ID, and IR had preformed a biopsy.  Echocardiogram from 5/23 had not not show any concern for any valvular vegetation.  Patient reports that he left on 6/9 as a family member had recently gotten a shot and he wanted to check on them.  He was not given any kind of medications when he left.  Patient reports that he began having very significant lower back pain.  He had not been eating very much because he did not have insulin.  He was able to have a bowel movement this morning.    Hospital course:  MSSA bacteremia L2-3 Discitis/osteomyelitis L2-3 myositis with phlegmon Patient was previously admitted and being treated with Cefazolin IV. ID at that time recommended a 6 course of IV antibiotics with end date of July 4th and recommendation for repeat MRI lumbar spine after  antibiotics. Patient managed on Cefazolin IV and given one dose of Oritavancin on 6/15 prior to discharge. ID with recommendation for no further IV antibiotics. Patient will need to follow-up with ID clinic.   Abdominal discomfort Constipation Secondary to narcotics. Some mild abdominal distension. Patient states he is having bowel movements. Senokot-S, Miralax BID. Abdominal x-ray obtained and is significant for stool burden in ascending/transverse colon correlating with tenderness and distension.   Plantar fasciitis Outpatient orthopedic surgery follow-up   Diabetes mellitus, type 2 Dietitian consulted for diet education. Discharge with Novolog 70/30 20 units BID.   Polysubstance abuse Cocaine positive on last admission. Ongoing tobacco use. Given nicotine patch while inpatient.   Discharge Diagnoses:  Principal Problem:   MSSA bacteremia Active Problems:   Discitis of lumbar region   Substance abuse (HCC)   Plantar fasciitis, right   Diabetes mellitus type 2, uncontrolled (HCC)   Back pain   Phlegmon    Discharge Instructions   Allergies as of 12/29/2020   No Known Allergies      Medication List     STOP taking these medications    methocarbamol 500 MG tablet Commonly known as: Robaxin   traMADol 50 MG tablet Commonly known as: Ultram       TAKE these medications    metFORMIN 500 MG tablet Commonly known as: GLUCOPHAGE Take 1 tablet (500 mg total) by mouth 2 (two) times daily with a meal.   NovoLIN 70/30 FlexPen Relion (70-30) 100 UNIT/ML KwikPen Generic drug: insulin isophane & regular human Inject 20 Units into the skin 2 (two) times daily with a meal.   oxyCODONE 15 MG  immediate release tablet Commonly known as: ROXICODONE Take 1 tablet (15 mg total) by mouth every 6 (six) hours as needed for up to 5 days for moderate pain.   polyethylene glycol 17 g packet Commonly known as: MIRALAX / GLYCOLAX Take 17 g by mouth 2 (two) times daily.    pregabalin 100 MG capsule Commonly known as: LYRICA Take 1 capsule (100 mg total) by mouth 3 (three) times daily.   senna-docusate 8.6-50 MG tablet Commonly known as: Senokot-S Take 1 tablet by mouth 2 (two) times daily for 5 days.        Follow-up Information     Odette Fraction, MD. Go on 01/19/2021.   Specialty: Infectious Diseases Why: For hospital follow-up Contact information: [address] [employer] [address] [place] [address] [phone]                No Known Allergies  Consultations: Infectious disease   Procedures/Studies: CT ABDOMEN PELVIS WO CONTRAST  Result Date: 12/04/2020 CLINICAL DATA:  Abdominal pain, acute, nonlocalized. EXAM: CT ABDOMEN AND PELVIS WITHOUT CONTRAST TECHNIQUE: Multidetector CT imaging of the abdomen and pelvis was performed following the standard protocol without IV contrast. COMPARISON:  None. FINDINGS: Lower chest: Minimal atelectasis is present at the bases. No nodule or mass lesion is present. No significant airspace consolidation is present. Heart size is normal. No significant pleural or pericardial effusion is present. Hepatobiliary: No focal liver abnormality is seen. No gallstones, gallbladder wall thickening, or biliary dilatation. Pancreas: Unremarkable. No pancreatic ductal dilatation or surrounding inflammatory changes. Spleen: Normal in size without focal abnormality. Adrenals/Urinary Tract: Adrenal glands are normal bilaterally. A 2-3 mm nonobstructing stone is present at the lower pole of the left kidney. No other significant nephrolithiasis is present. Chest mass lesion or obstruction is present. Ureters are within normal limits. The urinary bladder is normal. Stomach/Bowel: Stomach and duodenum are within normal limits. Small bowel is unremarkable. Terminal ileum is within normal limits. Appendix is visualized and within normal limits. Extends into the anatomic pelvis. The ascending and transverse colon are normal. The  descending and sigmoid colon are unremarkable. Vascular/Lymphatic: No significant vascular findings are present. No enlarged abdominal or pelvic lymph nodes. Reproductive: Prostate is unremarkable. Other: No abdominal wall hernia or abnormality. No abdominopelvic ascites. Musculoskeletal: Vertebral body heights and alignment are normal. Lucency noted at the inferior left aspect of the L2 vertebral body. Asymmetric ossification is present along the left side of the disc margin. No other focal lucencies are evident. IMPRESSION: 1. Focal lucency to along the left inferior aspect of the L2 vertebral body with extension laterally raises concern for metastatic disease. Recommend MRI of the lumbar spine without and with contrast. No other focal osseous lesions are present. No primary lesion is evident. 2. No other acute or focal lesion to explain the patient's abdominal pain. 3. 2-3 mm nonobstructing stone at the lower pole of the left kidney. Electronically Signed   By: Marin Roberts M.D.   On: 12/04/2020 11:47   MR LUMBAR SPINE WO CONTRAST  Result Date: 12/04/2020 CLINICAL DATA:  Low back pain.  Abnormal CT lumbar spine EXAM: MRI LUMBAR SPINE WITHOUT CONTRAST TECHNIQUE: Multiplanar, multisequence MR imaging of the lumbar spine was performed. No intravenous contrast was administered. COMPARISON:  CT lumbar spine 12/04/2020 FINDINGS: Segmentation:  Normal Alignment:  Normal Vertebrae: There is edema in the L2 vertebral body on the left extending to the endplate. There is a smaller area of edema in the superior endplate of L3 on  the left. The endplates appear eroded on the left at L2-3. There is extensive edema in the left psoas muscle which is enlarged and hyperintense on T2. There are small loculated fluid collections in the left psoas muscle suggestive of abscess. No epidural abscess. Postcontrast imaging recommended for further evaluation. No other areas of bone marrow edema. Conus medullaris and cauda  equina: Conus extends to the L1-2 level. Conus and cauda equina appear normal. Paraspinal and other soft tissues: Left psoas muscle is asymmetrically enlarged and edematous with loculated small fluid collections in the left psoas muscle. Right psoas muscle is normal. Disc levels: L1-2: Negative L2-3: Mild disc space narrowing. Endplate erosion on the left with edema extending into the L2 and L3 vertebral bodies. Right side of the disc space appears normal. No significant spinal stenosis. L3-4: Negative L4-5: Mild disc and mild facet degeneration.  Negative for stenosis L5-S1: Mild disc bulging and mild facet degeneration. Mild subarticular and foraminal stenosis bilaterally. IMPRESSION: Endplate erosions at L2-3 on the left. There is extensive bone marrow edema on the left at L2 and a smaller area of edema in the superior bone marrow at L3 on the left. This extends into the left psoas muscle which is enlarged, edematous, with multiple loculated fluid collections. Findings most likely due to discitis rather than tumor. Postcontrast imaging is recommended to further evaluate the abscesses and evaluate for epidural abscess which is not definitely seen on the unenhanced study. These results were called by telephone at the time of interpretation on 12/04/2020 at 4:52 pm to provider PA, who verbally acknowledged these results. Electronically Signed   By: Marlan Palau M.D.   On: 12/04/2020 16:53   MR LUMBAR SPINE W CONTRAST  Result Date: 12/04/2020 CLINICAL DATA:  Probable L2-3 discitis osteomyelitis. Follow-up postcontrast imaging. EXAM: MRI LUMBAR SPINE WITH CONTRAST TECHNIQUE: Axial and sagittal T1 pre and postcontrast sequences of the lumbar spine were performed. CONTRAST:  9mL GADAVIST GADOBUTROL 1 MMOL/ML IV SOLN COMPARISON:  Same day noncontrast lumbar spine MRI and CT FINDINGS: Segmentation:  Standard. Alignment:  Physiologic. Vertebrae: Findings of discitis-osteomyelitis of the L2-3 level with endplate  erosions. Prominent marrow enhancement throughout the L2 vertebral body extending into the left pedicle. Mild marrow enhancement at the superior aspect of the L3 vertebral body. Confluent low T1 signal within the vertebral bodies at these locations. The remaining vertebral bodies are within normal limits. No evidence of fracture. No additional sites of discitis-osteomyelitis are evident. Conus medullaris and cauda equina: Conus extends to the L1-2 level. No epidural phlegmon or abscess. Paraspinal and other soft tissues: Asymmetric enlargement of the left psoas muscle centered at the L2-3 level with diffuse intramuscular enhancement. Fluid signal within the left psoas muscle without definite peripherally enhancing collection suggests myositis with phlegmon. Disc levels: Please see previous MRI report for level by level detail of the disc levels. IMPRESSION: 1. Postcontrast images of the lumbar spine are consistent with discitis-osteomyelitis at the L2-3 level. No epidural phlegmon or abscess. 2. Asymmetric enlargement of the left psoas muscle centered at the L2-3 level with diffuse intramuscular enhancement. Fluid signal within the left psoas muscle without definite peripherally enhancing collection suggests myositis with phlegmon. Electronically Signed   By: Duanne Guess D.O.   On: 12/04/2020 19:42   CT L-SPINE NO CHARGE  Result Date: 12/04/2020 CLINICAL DATA:  Low back pain several weeks. EXAM: CT LUMBAR SPINE WITHOUT CONTRAST TECHNIQUE: Multidetector CT imaging of the lumbar spine was performed without intravenous contrast administration. Multiplanar CT image  reconstructions were also generated. COMPARISON:  CT abdomen pelvis 12/04/2020 FINDINGS: Segmentation: Normal Alignment: Normal Vertebrae: There is bony destruction of the inferior L2 vertebral body on the left extending to the disc space. There is also mild erosion of the superior endplate of L3 on the left. Small amount of calcification extends  lateral from the disc space into the left psoas muscle. No fracture is identified. No other skeletal lesion identified. Paraspinal and other soft tissues: No paraspinous fluid collection identified. Calcification lateral to the L2-3 disc space on the left extends into the psoas muscle which is likely due to bony destruction. Disc levels: L1-2: Negative L2-3: Mild disc space narrowing. Erosive endplate changes are present on the left at L2-3 primarily at L2 with bony destruction extending well into the left L2 vertebral body on the left. Calcification extending into the left psoas muscle. Mild left foraminal narrowing. L3-4: Mild disc bulging and mild facet degeneration. No significant stenosis. L4-5: Mild disc bulging and mild to moderate facet degeneration bilaterally. 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 There is mild erosion of the superior endplate of L3. There appears to be bone extending into the left psoas muscle which is likely due to destructive mass lesion. Favor metastatic disease although infection is a consideration. Recommend MRI lumbar spine without with contrast. No significant spinal stenosis. Electronically Signed   By: Marlan Palau M.D.   On: 12/04/2020 11:33   DG Abd 2 Views  Result Date: 12/22/2020 CLINICAL DATA:  Lower abdominal pain for 1 month EXAM: ABDOMEN - 2 VIEW COMPARISON:  CT 12/04/2020 FINDINGS: Nonobstructive bowel gas pattern. There is a moderate stool burden within the colon. There is no acute osseous abnormality. IMPRESSION: No evidence of bowel obstruction. Moderate stool burden within the colon. Electronically Signed   By: Caprice Renshaw   On: 12/22/2020 13:58   DG Foot Complete Right  Result Date: 12/16/2020 CLINICAL DATA:  Abscess EXAM: RIGHT FOOT COMPLETE - 3+ VIEW COMPARISON:  None. FINDINGS: Alignment is anatomic. No acute fracture. Joint spaces are preserved.  There is no erosive change or periosteal reaction. No evidence of soft tissue gas. No radiopaque foreign body. IMPRESSION: Negative. Electronically Signed   By: Guadlupe Spanish M.D.   On: 12/16/2020 11:56   ECHOCARDIOGRAM COMPLETE  Result Date: 12/06/2020    ECHOCARDIOGRAM REPORT   Patient Name:   Jeffrey Webb Date of Exam: 12/06/2020 Medical Rec #:  [id number]    Height:       66.0 in Accession #:    [id number]   Weight:       195.0 lb Date of Birth:  04-Nov-1975    BSA:          1.978 m Patient Age:    45 years     BP:           125/85 mmHg Patient Gender: M            HR:           98 bpm. Exam Location:  Inpatient Procedure: 2D Echo, Cardiac Doppler and Color Doppler Indications:    Bacteremia R78.81  History:        Patient has no prior history of Echocardiogram examinations.  Sonographer:    Elmarie Shiley Dance Referring Phys: [id number] Jaxden W COMER IMPRESSIONS  1. Left ventricular ejection fraction, by estimation, is 55 to 60%. The left ventricle has normal function. The left ventricle has  no regional wall motion abnormalities. Left ventricular diastolic parameters are consistent with Grade I diastolic dysfunction (impaired relaxation).  2. Right ventricular systolic function is normal. The right ventricular size is normal. Tricuspid regurgitation signal is inadequate for assessing PA pressure.  3. The mitral valve is normal in structure. No evidence of mitral valve regurgitation. No evidence of mitral stenosis.  4. The aortic valve is tricuspid. Aortic valve regurgitation is not visualized. No aortic stenosis is present.  5. The inferior vena cava is normal in size with greater than 50% respiratory variability, suggesting right atrial pressure of 3 mmHg.  6. No valvular vegetation noted. FINDINGS  Left Ventricle: Left ventricular ejection fraction, by estimation, is 55 to 60%. The left ventricle has normal function. The left ventricle has no regional wall motion abnormalities. The left ventricular internal cavity  size was normal in size. There is  no left ventricular hypertrophy. Left ventricular diastolic parameters are consistent with Grade I diastolic dysfunction (impaired relaxation). Right Ventricle: The right ventricular size is normal. No increase in right ventricular wall thickness. Right ventricular systolic function is normal. Tricuspid regurgitation signal is inadequate for assessing PA pressure. Left Atrium: Left atrial size was normal in size. Right Atrium: Right atrial size was normal in size. Pericardium: Trivial pericardial effusion is present. Mitral Valve: The mitral valve is normal in structure. No evidence of mitral valve regurgitation. No evidence of mitral valve stenosis. Tricuspid Valve: The tricuspid valve is normal in structure. Tricuspid valve regurgitation is not demonstrated. Aortic Valve: The aortic valve is tricuspid. Aortic valve regurgitation is not visualized. No aortic stenosis is present. Pulmonic Valve: The pulmonic valve was normal in structure. Pulmonic valve regurgitation is trivial. Aorta: The aortic root is normal in size and structure. Venous: The inferior vena cava is normal in size with greater than 50% respiratory variability, suggesting right atrial pressure of 3 mmHg. IAS/Shunts: No atrial level shunt detected by color flow Doppler.  LEFT VENTRICLE PLAX 2D LVIDd:         3.90 cm  Diastology LVIDs:         2.50 cm  LV e' medial:    5.77 cm/s LV PW:         0.90 cm  LV E/e' medial:  8.7 LV IVS:        1.10 cm  LV e' lateral:   9.79 cm/s LVOT diam:     2.00 cm  LV E/e' lateral: 5.1 LV SV:         39 LV SV Index:   20 LVOT Area:     3.14 cm  RIGHT VENTRICLE             IVC RV Basal diam:  2.10 cm     IVC diam: 1.20 cm RV S prime:     16.10 cm/s TAPSE (M-mode): 1.7 cm LEFT ATRIUM             Index       RIGHT ATRIUM          Index LA diam:        2.70 cm 1.36 cm/m  RA Area:     6.15 cm LA Vol (A2C):   21.5 ml 10.87 ml/m RA Volume:   8.28 ml  4.19 ml/m LA Vol (A4C):   16.5 ml  8.34 ml/m LA Biplane Vol: 20.2 ml 10.21 ml/m  AORTIC VALVE LVOT Vmax:   89.70 cm/s LVOT Vmean:  57.200 cm/s LVOT VTI:    0.124 m  AORTA Ao Root diam: 3.30 cm Ao Asc diam:  2.50 cm MITRAL VALVE MV Area (PHT): 4.49 cm    SHUNTS MV Decel Time: 169 msec    Systemic VTI:  0.12 m MV E velocity: 50.00 cm/s  Systemic Diam: 2.00 cm MV A velocity: 55.30 cm/s MV E/A ratio:  0.90 Marca Ancona MD Electronically signed by Marca Ancona MD Signature Date/Time: 12/06/2020/7:57:10 PM    Final    [place] RT LOWER EXTREM LTD SOFT TISSUE NON VASCULAR  Result Date: 12/18/2020 CLINICAL DATA:  Right foot plantar foot infection EXAM: ULTRASOUND RIGHT LOWER EXTREMITY LIMITED TECHNIQUE: Ultrasound examination of the lower extremity soft tissues was performed in the area of clinical concern. COMPARISON:  None. FINDINGS: Joint Space: Not applicable Muscles: Not applicable Tendons: Not applicable Other Soft Tissue Structures: Limited grayscale and color Doppler sonography in the area of previous incision and drainage demonstrates a focal area of subcutaneous interstitial edema without discrete loculated fluid collection identified. There is mild hyperemia in this region with mild thickening of the overlying skin. IMPRESSION: Focal interstitial edema in the area of prior surgical incision without discrete drainable fluid collection identified. Electronically Signed   By: Helyn Numbers MD   On: 12/18/2020 00:33      Subjective: No issues today. Bowel movement last night per patient.  Discharge Exam: Vitals:   12/29/20 0232 12/29/20 0730  BP: 109/75 107/76  Pulse: 76 69  Resp: 17 17  Temp: 98.1 F (36.7 C) 98.4 F (36.9 C)  SpO2: 100% 100%   Vitals:   12/28/20 0758 12/28/20 1435 12/29/20 0232 12/29/20 0730  BP: (!) 113/99 133/75 109/75 107/76  Pulse: 71 79 76 69  Resp: Temp: 98.9 F (37.2 C) 98.2 F (36.8 C) 98.1 F (36.7 C) 98.4 F (36.9 C)  TempSrc: Oral Oral Oral Oral  SpO2: 97% 100% 100% 100%   Weight:      Height:        General exam: Appears calm and comfortable Respiratory system: Clear to auscultation. Respiratory effort normal. Cardiovascular system: S1 & S2 heard, RRR. No murmurs, rubs, gallops or clicks. Gastrointestinal system: Abdomen is mildly distended in RUQ, soft and mildly tender. No organomegaly or masses felt. Normal bowel sounds heard. Central nervous system: Alert and oriented. No focal neurological deficits. Musculoskeletal: No edema. No calf tenderness Skin: No cyanosis. No rashes Psychiatry: Judgement and insight appear normal. Mood & affect appropriate.    The results of significant diagnostics from this hospitalization (including imaging, microbiology, ancillary and laboratory) are listed below for reference.     Microbiology: Recent Results (from the past 240 hour(s))  Resp Panel by RT-PCR (Flu A&B, Covid) Nasopharyngeal Swab     Status: None   Collection Time: 12/25/20  8:13 AM   Specimen: Nasopharyngeal Swab; Nasopharyngeal(NP) swabs in vial transport medium  Result Value Ref Range Status   SARS Coronavirus 2 by RT PCR NEGATIVE NEGATIVE Final    Comment: (NOTE) SARS-CoV-2 target nucleic acids are NOT DETECTED.  The SARS-CoV-2 RNA is generally detectable in upper respiratory specimens during the acute phase of infection. The lowest concentration of SARS-CoV-2 viral copies this assay can detect is 138 copies/mL. A negative result does not preclude SARS-Cov-2 infection and should not be used as the sole basis for treatment or other patient management decisions. A negative result may occur with  improper specimen collection/handling, submission of specimen other than nasopharyngeal swab, presence of viral mutation(s) within the areas targeted by this assay,  and inadequate number of viral copies(<138 copies/mL). A negative result must be combined with clinical observations, patient history, and epidemiological information. The expected result is  Negative.  Fact Sheet for Patients:  BloggerCourse.comhttps://www.fda.gov/media/152166/download  Fact Sheet for Healthcare Providers:  SeriousBroker.ithttps://www.fda.gov/media/152162/download  This test is no t yet approved or cleared by the [place] FDA and  has been authorized for detection and/or diagnosis of SARS-CoV-2 by FDA under an Emergency Use Authorization (EUA). This EUA will remain  in effect (meaning this test can be used) for the duration of the COVID-19 declaration under Section 564(b)(1) of the Act, 21 U.S.C.section 360bbb-3(b)(1), unless the authorization is terminated  or revoked sooner.       Influenza A by PCR NEGATIVE NEGATIVE Final   Influenza B by PCR NEGATIVE NEGATIVE Final    Comment: (NOTE) The Xpert Xpress SARS-CoV-2/FLU/RSV plus assay is intended as an aid in the diagnosis of influenza from Nasopharyngeal swab specimens and should not be used as a sole basis for treatment. Nasal washings and aspirates are unacceptable for Xpert Xpress SARS-CoV-2/FLU/RSV testing.  Fact Sheet for Patients: BloggerCourse.comhttps://www.fda.gov/media/152166/download  Fact Sheet for Healthcare Providers: SeriousBroker.ithttps://www.fda.gov/media/152162/download  This test is not yet approved or cleared by the [place] FDA and has been authorized for detection and/or diagnosis of SARS-CoV-2 by FDA under an Emergency Use Authorization (EUA). This EUA will remain in effect (meaning this test can be used) for the duration of the COVID-19 declaration under Section 564(b)(1) of the Act, 21 U.S.C. section 360bbb-3(b)(1), unless the authorization is terminated or revoked.  Performed at [facility], [address] [place], [place] [address]   Blood culture (routine x 2)     Status: None (Preliminary result)   Collection Time: 12/25/20  8:17 AM   Specimen: BLOOD RIGHT FOREARM  Result Value Ref Range Status   Specimen Description BLOOD RIGHT FOREARM  Final   Special Requests   Final    BOTTLES DRAWN AEROBIC AND  ANAEROBIC Blood Culture adequate volume   Culture   Final    NO GROWTH 4 DAYS Performed at [facility], [address] [place], [place] [address]    Report Status PENDING  Incomplete  Blood culture (routine x 2)     Status: None (Preliminary result)   Collection Time: 12/25/20  8:22 AM   Specimen: BLOOD RIGHT HAND  Result Value Ref Range Status   Specimen Description BLOOD RIGHT HAND  Final   Special Requests   Final    BOTTLES DRAWN AEROBIC AND ANAEROBIC Blood Culture adequate volume   Culture   Final    NO GROWTH 4 DAYS Performed at [facility], [address] [place], [place] [id number]    Report Status PENDING  Incomplete     Labs: BNP (last 3 results) No results for input(s): BNP in the last 8760 hours. Basic Metabolic Panel: Recent Labs  Lab 12/23/20 0352 12/25/20 0021 12/29/20 0843  NA 137 138 135  K 3.7 3.6 4.1  CL 104 106 104  CO2 24 24 24   GLUCOSE 222* 208* 247*  BUN 13 11 12   CREATININE 0.77 0.94 1.01  CALCIUM 8.9 8.9 8.9  MG 1.8  --   --    Liver Function Tests: Recent Labs  Lab 12/25/20 0021 12/29/20 0843  AST 26 20  ALT 15 13  ALKPHOS 104 105  BILITOT 0.6 0.3  PROT 7.0 7.1  ALBUMIN 3.1* 3.0*   No results for input(s): LIPASE, AMYLASE in the  last 168 hours. No results for input(s): AMMONIA in the last 168 hours. CBC: Recent Labs  Lab 12/23/20 0352 12/25/20 0021 12/28/20 0718  WBC 10.6* 7.9 6.2  HGB 12.3* 11.7* 12.2*  HCT 38.4* 37.4* 39.2  MCV 82.9 83.7 83.8  PLT 316 349 364   Cardiac Enzymes: No results for input(s): CKTOTAL, CKMB, CKMBINDEX, TROPONINI in the last 168 hours. BNP: Invalid input(s): POCBNP CBG: Recent Labs  Lab 12/28/20 1226 12/28/20 1635 12/28/20 2003 12/29/20 0617 12/29/20 1112  GLUCAP 218* 217* 161* 244* 134*   D-Dimer No results for input(s): DDIMER in the last 72 hours. Hgb A1c No results for input(s): HGBA1C in the last 72 hours. Lipid Profile No results for input(s): CHOL, HDL,  LDLCALC, TRIG, CHOLHDL, LDLDIRECT in the last 72 hours. Thyroid function studies No results for input(s): TSH, T4TOTAL, T3FREE, THYROIDAB in the last 72 hours.  Invalid input(s): FREET3 Anemia work up No results for input(s): VITAMINB12, FOLATE, FERRITIN, TIBC, IRON, RETICCTPCT in the last 72 hours. Urinalysis    Component Value Date/Time   COLORURINE YELLOW 12/04/2020 0506   APPEARANCEUR CLEAR 12/04/2020 0506   LABSPEC 1.028 12/04/2020 0506   PHURINE 6.0 12/04/2020 0506   GLUCOSEU >=500 (A) 12/04/2020 0506   HGBUR NEGATIVE 12/04/2020 0506   BILIRUBINUR NEGATIVE 12/04/2020 0506   KETONESUR 5 (A) 12/04/2020 0506   PROTEINUR NEGATIVE 12/04/2020 0506   NITRITE NEGATIVE 12/04/2020 0506   LEUKOCYTESUR NEGATIVE 12/04/2020 0506   Sepsis Labs Invalid input(s): PROCALCITONIN,  WBC,  LACTICIDVEN Microbiology Recent Results (from the past 240 hour(s))  Resp Panel by RT-PCR (Flu A&B, Covid) Nasopharyngeal Swab     Status: None   Collection Time: 12/25/20  8:13 AM   Specimen: Nasopharyngeal Swab; Nasopharyngeal(NP) swabs in vial transport medium  Result Value Ref Range Status   SARS Coronavirus 2 by RT PCR NEGATIVE NEGATIVE Final    Comment: (NOTE) SARS-CoV-2 target nucleic acids are NOT DETECTED.  The SARS-CoV-2 RNA is generally detectable in upper respiratory specimens during the acute phase of infection. The lowest concentration of SARS-CoV-2 viral copies this assay can detect is 138 copies/mL. A negative result does not preclude SARS-Cov-2 infection and should not be used as the sole basis for treatment or other patient management decisions. A negative result may occur with  improper specimen collection/handling, submission of specimen other than nasopharyngeal swab, presence of viral mutation(s) within the areas targeted by this assay, and inadequate number of viral copies(<138 copies/mL). A negative result must be combined with clinical observations, patient history, and  epidemiological information. The expected result is Negative.  Fact Sheet for Patients:  BloggerCourse.com  Fact Sheet for Healthcare Providers:  SeriousBroker.it  This test is no t yet approved or cleared by the [place] FDA and  has been authorized for detection and/or diagnosis of SARS-CoV-2 by FDA under an Emergency Use Authorization (EUA). This EUA will remain  in effect (meaning this test can be used) for the duration of the COVID-19 declaration under Section 564(b)(1) of the Act, 21 U.S.C.section 360bbb-3(b)(1), unless the authorization is terminated  or revoked sooner.       Influenza A by PCR NEGATIVE NEGATIVE Final   Influenza B by PCR NEGATIVE NEGATIVE Final    Comment: (NOTE) The Xpert Xpress SARS-CoV-2/FLU/RSV plus assay is intended as an aid in the diagnosis of influenza from Nasopharyngeal swab specimens and should not be used as a sole basis for treatment. Nasal washings and aspirates are unacceptable for Xpert Xpress SARS-CoV-2/FLU/RSV testing.  Fact Sheet  for Patients: BloggerCourse.com  Fact Sheet for Healthcare Providers: SeriousBroker.it  This test is not yet approved or cleared by the [place] FDA and has been authorized for detection and/or diagnosis of SARS-CoV-2 by FDA under an Emergency Use Authorization (EUA). This EUA will remain in effect (meaning this test can be used) for the duration of the COVID-19 declaration under Section 564(b)(1) of the Act, 21 U.S.C. section 360bbb-3(b)(1), unless the authorization is terminated or revoked.  Performed at [facility], [address], [place], [place] [address]   Blood culture (routine x 2)     Status: None (Preliminary result)   Collection Time: 12/25/20  8:17 AM   Specimen: BLOOD RIGHT FOREARM  Result Value Ref Range Status   Specimen Description BLOOD RIGHT FOREARM  Final   Special  Requests   Final    BOTTLES DRAWN AEROBIC AND ANAEROBIC Blood Culture adequate volume   Culture   Final    NO GROWTH 4 DAYS Performed at [facility], [address], [place], [place] [address]    Report Status PENDING  Incomplete  Blood culture (routine x 2)     Status: None (Preliminary result)   Collection Time: 12/25/20  8:22 AM   Specimen: BLOOD RIGHT HAND  Result Value Ref Range Status   Specimen Description BLOOD RIGHT HAND  Final   Special Requests   Final    BOTTLES DRAWN AEROBIC AND ANAEROBIC Blood Culture adequate volume   Culture   Final    NO GROWTH 4 DAYS Performed at [facility], [address], [place], [place] [address]    Report Status PENDING  Incomplete     Time coordinating discharge: 35 minutes  SIGNED:   Jacquelin Hawking, MD [facility] 12/29/2020, 1:27 PM

## 2020-12-29 NOTE — Progress Notes (Signed)
Cheyenne Adas to be D/C'd Home per MD order. Discussed with the patient and all questions fully answered.    IV catheter discontinued intact. Site without signs and symptoms of complications. Dressing and pressure applied.  An After Visit Summary was printed and given to the patient.  Patient escorted via WC, and D/C home via private auto.  Kai Levins  12/29/2020 5:47 PM

## 2020-12-29 NOTE — TOC Transition Note (Addendum)
Transition of Care [facility]) - CM/SW Discharge Note   Patient Details  Name: Siegfried Vieth MRN: [id number] Date of Birth: Sep 01, 1975  Transition of [facility]) CM/SW Contact:  Ralene Bathe, LCSWA Phone Number: 12/29/2020, 1:01 PM   Clinical Narrative:     Per MD patient is ready to discharge home.  CSW received a consult for d/c.  The patient was alert and requesting assistance with medications, medicaid, and PCP.    CSW contacted Financial Counseling and spoke with [place].  The patient was assigned to Med assist and they will screen the patient medicaid.    CSW contacted Clydie Braun with [facility] for a hospital follow up appointment and PCP. The patient has an appointment for 7/6 at 8:30am.    CSW contacted RNCM, Gae Gallop, and requested that [facility] letter be added for medications.    CSW requested that Attending to send medication to the [facility] pharmacy.    13:52- Match letter in.  Pending pharmacy workup.    15:30- Home medications delivered to the charge RN, Pam.    Patient informed of the information above.  The patient's brother will transport the patient home once cleared to d/c.    CSW will sign off for now as social work intervention is no longer needed. Please consult [place] again if new needs arise.    Final next level of care: Home/Self Care Barriers to Discharge: Barriers Resolved   Patient Goals and CMS Choice        Discharge Placement                       Discharge Plan and Services                                     Social Determinants of Health (SDOH) Interventions     Readmission Risk Interventions No flowsheet data found.

## 2020-12-29 NOTE — Progress Notes (Signed)
PROGRESS NOTE    Jeffrey Webb  JAS:[id number] DOB: 11-11-1975 DOA: 12/25/2020 PCP: Patient, No Pcp Per (Inactive)   Brief Narrative: Jeffrey Webb is a 45 y.o. male with medical history significant of recent hospitalization with MSSA bacteremia, lumbar osteomyelitis/discitis, concern for left psoas muscle phlegmon newly diagnosed diabetes mellitus type 2, and polysubstance abuse including cocaine and tobacco. Patient presented to resume IV antibiotic treatment for bacteremia/osteomyelitis/discitis.   Assessment & Plan:   Principal Problem:   MSSA bacteremia Active Problems:   Discitis of lumbar region   Substance abuse (HCC)   Plantar fasciitis, right   Diabetes mellitus type 2, uncontrolled (HCC)   Back pain   Phlegmon   MSSA bacteremia L2-3 Discitis/osteomyelitis L2-3 myositis with phlegmon Patient was previously admitted and being treated with Cefazolin IV. ID at that time recommended a 6 course of IV antibiotics with end date of July 4th and recommendation for repeat MRI lumbar spine after antibiotics. -Continue Cefazolin; ID consult in AM to verify no changes to management -Oxycodone, Lyrica prn for pain -Continue Baclofen with plan for taper -Infectious disease recommendations: consideration for oritavancin IV as an outpatient but awaiting final blood culture results  Abdominal discomfort Constipation Secondary to narcotics. Some mild abdominal distension. Patient states he is having bowel movements. -Senokot-S, Miralax BID -Abdominal x-ray, may need enema if significant stool burden  Plantar fasciitis Outpatient orthopedic surgery follow-up  Diabetes mellitus, type 2 Dietitian consulted for diet education.  -Increase to  Novolog 70/30 15 units BID; titrate for blood sugar control  Polysubstance abuse Cocaine positive on last admission. Ongoing tobacco use. -Continue nicotine patch   DVT prophylaxis: Xarelto (VTE dose) Code Status:   Code Status: Full  Code Family Communication: None at bedside Disposition Plan: Discharge home pending completion of IV antibiotics vs transition to outpatient IV infusions of oritavancin. Not a candidate for discharge with PICC line secondary to drug use.   Consultants:  Infectious disease  Procedures:  None  Antimicrobials: Cefazolin IV   Subjective: No issues this morning or overnight.  Objective: Vitals:   12/28/20 0758 12/28/20 1435 12/29/20 0232 12/29/20 0730  BP: (!) 113/99 133/75 109/75 107/76  Pulse: 71 79 76 69  Resp: 17 17 17 17   Temp: 98.9 F (37.2 C) 98.2 F (36.8 C) 98.1 F (36.7 C) 98.4 F (36.9 C)  TempSrc: Oral Oral Oral Oral  SpO2: 97% 100% 100% 100%  Weight:      Height:        Intake/Output Summary (Last 24 hours) at 12/29/2020 1106 Last data filed at 12/28/2020 1500 Gross per 24 hour  Intake 360 ml  Output --  Net 360 ml    Filed Weights   12/25/20 0018  Weight: 82.1 kg    Examination:  General exam: Appears calm and comfortable Respiratory system: Clear to auscultation. Respiratory effort normal. Cardiovascular system: S1 & S2 heard, RRR. No murmurs, rubs, gallops or clicks. Gastrointestinal system: Abdomen is mildly distended in RUQ, soft and mildly tender. No organomegaly or masses felt. Normal bowel sounds heard. Central nervous system: Alert and oriented. No focal neurological deficits. Musculoskeletal: No edema. No calf tenderness Skin: No cyanosis. No rashes Psychiatry: Judgement and insight appear normal. Mood & affect appropriate.     Data Reviewed: I have personally reviewed following labs and imaging studies  CBC Lab Results  Component Value Date   WBC 6.2 12/28/2020   RBC 4.68 12/28/2020   HGB 12.2 (L) 12/28/2020   HCT 39.2 12/28/2020   MCV  83.8 12/28/2020   MCH 26.1 12/28/2020   PLT 364 12/28/2020   MCHC 31.1 12/28/2020   RDW 14.0 12/28/2020   LYMPHSABS 2.1 12/18/2020   MONOABS 0.8 12/18/2020   EOSABS 0.1 12/18/2020   BASOSABS  0.1 12/18/2020     Last metabolic panel Lab Results  Component Value Date   NA 135 12/29/2020   K 4.1 12/29/2020   CL 104 12/29/2020   CO2 24 12/29/2020   BUN 12 12/29/2020   CREATININE 1.01 12/29/2020   GLUCOSE 247 (H) 12/29/2020   GFRNONAA >60 12/29/2020   CALCIUM 8.9 12/29/2020   PHOS 4.2 12/05/2020   PROT 7.1 12/29/2020   ALBUMIN 3.0 (L) 12/29/2020   BILITOT 0.3 12/29/2020   ALKPHOS 105 12/29/2020   AST 20 12/29/2020   ALT 13 12/29/2020   ANIONGAP 7 12/29/2020    CBG (last 3)  Recent Labs    12/28/20 1635 12/28/20 2003 12/29/20 0617  GLUCAP 217* 161* 244*      GFR: Estimated Creatinine Clearance: 93.9 mL/min (by C-G formula based on SCr of 1.01 mg/dL).  Coagulation Profile: No results for input(s): INR, PROTIME in the last 168 hours.  Recent Results (from the past 240 hour(s))  Resp Panel by RT-PCR (Flu A&B, Covid) Nasopharyngeal Swab     Status: None   Collection Time: 12/25/20  8:13 AM   Specimen: Nasopharyngeal Swab; Nasopharyngeal(NP) swabs in vial transport medium  Result Value Ref Range Status   SARS Coronavirus 2 by RT PCR NEGATIVE NEGATIVE Final    Comment: (NOTE) SARS-CoV-2 target nucleic acids are NOT DETECTED.  The SARS-CoV-2 RNA is generally detectable in upper respiratory specimens during the acute phase of infection. The lowest concentration of SARS-CoV-2 viral copies this assay can detect is 138 copies/mL. A negative result does not preclude SARS-Cov-2 infection and should not be used as the sole basis for treatment or other patient management decisions. A negative result may occur with  improper specimen collection/handling, submission of specimen other than nasopharyngeal swab, presence of viral mutation(s) within the areas targeted by this assay, and inadequate number of viral copies(<138 copies/mL). A negative result must be combined with clinical observations, patient history, and epidemiological information. The expected result is  Negative.  Fact Sheet for Patients:  BloggerCourse.com  Fact Sheet for Healthcare Providers:  SeriousBroker.it  This test is no t yet approved or cleared by the [place] FDA and  has been authorized for detection and/or diagnosis of SARS-CoV-2 by FDA under an Emergency Use Authorization (EUA). This EUA will remain  in effect (meaning this test can be used) for the duration of the COVID-19 declaration under Section 564(b)(1) of the Act, 21 U.S.C.section 360bbb-3(b)(1), unless the authorization is terminated  or revoked sooner.       Influenza A by PCR NEGATIVE NEGATIVE Final   Influenza B by PCR NEGATIVE NEGATIVE Final    Comment: (NOTE) The Xpert Xpress SARS-CoV-2/FLU/RSV plus assay is intended as an aid in the diagnosis of influenza from Nasopharyngeal swab specimens and should not be used as a sole basis for treatment. Nasal washings and aspirates are unacceptable for Xpert Xpress SARS-CoV-2/FLU/RSV testing.  Fact Sheet for Patients: BloggerCourse.com  Fact Sheet for Healthcare Providers: SeriousBroker.it  This test is not yet approved or cleared by the [place] FDA and has been authorized for detection and/or diagnosis of SARS-CoV-2 by FDA under an Emergency Use Authorization (EUA). This EUA will remain in effect (meaning this test can be used) for the duration of the  COVID-19 declaration under Section 564(b)(1) of the Act, 21 U.S.C. section 360bbb-3(b)(1), unless the authorization is terminated or revoked.  Performed at [facility], [address], [place], [place] [address]   Blood culture (routine x 2)     Status: None (Preliminary result)   Collection Time: 12/25/20  8:17 AM   Specimen: BLOOD RIGHT FOREARM  Result Value Ref Range Status   Specimen Description BLOOD RIGHT FOREARM  Final   Special Requests   Final    BOTTLES DRAWN AEROBIC AND  ANAEROBIC Blood Culture adequate volume   Culture   Final    NO GROWTH 4 DAYS Performed at [facility], [address] [place], [place] [address]    Report Status PENDING  Incomplete  Blood culture (routine x 2)     Status: None (Preliminary result)   Collection Time: 12/25/20  8:22 AM   Specimen: BLOOD RIGHT HAND  Result Value Ref Range Status   Specimen Description BLOOD RIGHT HAND  Final   Special Requests   Final    BOTTLES DRAWN AEROBIC AND ANAEROBIC Blood Culture adequate volume   Culture   Final    NO GROWTH 4 DAYS Performed at [facility], [address], [place], [place] [address]    Report Status PENDING  Incomplete         Radiology Studies: No results found.      Scheduled Meds:  baclofen  5 mg Oral BID   insulin aspart protamine- aspart  15 Units Subcutaneous BID WC   lidocaine  1 patch Transdermal Q24H   nicotine  21 mg Transdermal Daily   pantoprazole  40 mg Oral Daily   polyethylene glycol  17 g Oral BID   pregabalin  100 mg Oral TID   rivaroxaban  10 mg Oral Daily   senna-docusate  1 tablet Oral BID   Continuous Infusions:  oritavancin (ORBACTIV) IVPB       LOS: 4 days     Jacquelin Hawking, MD [facility] 12/29/2020, 11:06 AM  If 7PM-7AM, please contact night-coverage [email or web address]

## 2020-12-30 LAB — CULTURE, BLOOD (ROUTINE X 2)
Culture: NO GROWTH
Culture: NO GROWTH
Special Requests: ADEQUATE
Special Requests: ADEQUATE

## 2021-01-19 ENCOUNTER — Inpatient Hospital Stay: Payer: Self-pay | Admitting: Infectious Diseases

## 2021-01-19 ENCOUNTER — Ambulatory Visit: Payer: Self-pay | Admitting: Physician Assistant

## 2021-01-19 ENCOUNTER — Other Ambulatory Visit: Payer: Self-pay

## 2021-01-19 NOTE — Progress Notes (Deleted)
Patient ID: Jeffrey Webb, male   DOB: 02-18-1976, 45 y.o.   MRN: [id number]   After hospitalization 6/11-6/15/2022  HPI: Jeffrey Webb is a 45 y.o. male with medical history significant of recent hospitalization with MSSA bacteremia, lumbar osteomyelitis/discitis, concern for left psoas muscle phlegmon newly diagnosed diabetes mellitus type 2, and polysubstance abuse including cocaine and tobacco.  Patient had initially been admitted to the hospitalist service on 5/21 and was here up until 6/9 when patient left AGAINST MEDICAL ADVICE receiving IV antibiotics except:Marland Kitchen  During his hospitalization he had been evaluated by neurosurgery, ID, and IR had preformed a biopsy.  Echocardiogram from 5/23 had not not show any concern for any valvular vegetation.  Patient reports that he left on 6/9 as a family member had recently gotten a shot and he wanted to check on them.  He was not given any kind of medications when he left.  Patient reports that he began having very significant lower back pain.  He had not been eating very much because he did not have insulin.  He was able to have a bowel movement this morning.       Hospital course:   MSSA bacteremia L2-3 Discitis/osteomyelitis L2-3 myositis with phlegmon Patient was previously admitted and being treated with Cefazolin IV. ID at that time recommended a 6 course of IV antibiotics with end date of July 4th and recommendation for repeat MRI lumbar spine after antibiotics. Patient managed on Cefazolin IV and given one dose of Oritavancin on 6/15 prior to discharge. ID with recommendation for no further IV antibiotics. Patient will need to follow-up with ID clinic.   Abdominal discomfort Constipation Secondary to narcotics. Some mild abdominal distension. Patient states he is having bowel movements. Senokot-S, Miralax BID. Abdominal x-ray obtained and is significant for stool burden in ascending/transverse colon correlating with tenderness and distension.    Plantar fasciitis Outpatient orthopedic surgery follow-up   Diabetes mellitus, type 2 Dietitian consulted for diet education. Discharge with Novolog 70/30 20 units BID.   Polysubstance abuse Cocaine positive on last admission. Ongoing tobacco use. Given nicotine patch while inpatient.     Discharge Diagnoses:  Principal Problem:   MSSA bacteremia Active Problems:   Discitis of lumbar region   Substance abuse (HCC)   Plantar fasciitis, right   Diabetes mellitus type 2, uncontrolled (HCC)   Back pain   Phlegmon

## 2021-02-09 NOTE — Progress Notes (Deleted)
Patient ID: Jeffrey Webb, male   DOB: 12-Nov-1975, 45 y.o.   MRN: [id number]   After hospitalization 6/11-6/15/2022  From discharge summary:   HPI: Jeffrey Webb is a 45 y.o. male with medical history significant of recent hospitalization with MSSA bacteremia, lumbar osteomyelitis/discitis, concern for left psoas muscle phlegmon newly diagnosed diabetes mellitus type 2, and polysubstance abuse including cocaine and tobacco.  Patient had initially been admitted to the hospitalist service on 5/21 and was here up until 6/9 when patient left AGAINST MEDICAL ADVICE receiving IV antibiotics except:Marland Kitchen  During his hospitalization he had been evaluated by neurosurgery, ID, and IR had preformed a biopsy.  Echocardiogram from 5/23 had not not show any concern for any valvular vegetation.  Patient reports that he left on 6/9 as a family member had recently gotten a shot and he wanted to check on them.  He was not given any kind of medications when he left.  Patient reports that he began having very significant lower back pain.  He had not been eating very much because he did not have insulin.  He was able to have a bowel movement this morning.       Hospital course:   MSSA bacteremia L2-3 Discitis/osteomyelitis L2-3 myositis with phlegmon Patient was previously admitted and being treated with Cefazolin IV. ID at that time recommended a 6 course of IV antibiotics with end date of July 4th and recommendation for repeat MRI lumbar spine after antibiotics. Patient managed on Cefazolin IV and given one dose of Oritavancin on 6/15 prior to discharge. ID with recommendation for no further IV antibiotics. Patient will need to follow-up with ID clinic.   Abdominal discomfort Constipation Secondary to narcotics. Some mild abdominal distension. Patient states he is having bowel movements. Senokot-S, Miralax BID. Abdominal x-ray obtained and is significant for stool burden in ascending/transverse colon correlating with  tenderness and distension.   Plantar fasciitis Outpatient orthopedic surgery follow-up   Diabetes mellitus, type 2 Dietitian consulted for diet education. Discharge with Novolog 70/30 20 units BID.   Polysubstance abuse Cocaine positive on last admission. Ongoing tobacco use. Given nicotine patch while inpatient.     Discharge Diagnoses:  Principal Problem:   MSSA bacteremia Active Problems:   Discitis of lumbar region   Substance abuse (HCC)   Plantar fasciitis, right   Diabetes mellitus type 2, uncontrolled (HCC)   Back pain   Phlegmon

## 2021-02-10 ENCOUNTER — Ambulatory Visit: Payer: Self-pay | Attending: Physician Assistant | Admitting: Physician Assistant

## 2021-02-10 ENCOUNTER — Other Ambulatory Visit: Payer: Self-pay

## 2021-02-10 DIAGNOSIS — E118 Type 2 diabetes mellitus with unspecified complications: Secondary | ICD-10-CM

## 2021-05-17 ENCOUNTER — Other Ambulatory Visit: Payer: Self-pay

## 2021-05-18 ENCOUNTER — Other Ambulatory Visit: Payer: Self-pay

## 2021-06-11 ENCOUNTER — Other Ambulatory Visit: Payer: Self-pay

## 2021-06-11 ENCOUNTER — Encounter (HOSPITAL_COMMUNITY): Payer: Self-pay | Admitting: *Deleted

## 2021-06-11 ENCOUNTER — Emergency Department (HOSPITAL_COMMUNITY)
Admission: EM | Admit: 2021-06-11 | Discharge: 2021-06-11 | Disposition: A | Discharge status: Home / Self Care | Payer: Self-pay | Attending: Emergency Medicine | Admitting: Emergency Medicine

## 2021-06-11 DIAGNOSIS — E119 Type 2 diabetes mellitus without complications: Secondary | ICD-10-CM | POA: Insufficient documentation

## 2021-06-11 DIAGNOSIS — F17200 Nicotine dependence, unspecified, uncomplicated: Secondary | ICD-10-CM | POA: Insufficient documentation

## 2021-06-11 DIAGNOSIS — Z794 Long term (current) use of insulin: Secondary | ICD-10-CM | POA: Insufficient documentation

## 2021-06-11 DIAGNOSIS — L089 Local infection of the skin and subcutaneous tissue, unspecified: Secondary | ICD-10-CM | POA: Insufficient documentation

## 2021-06-11 DIAGNOSIS — G8929 Other chronic pain: Secondary | ICD-10-CM | POA: Insufficient documentation

## 2021-06-11 DIAGNOSIS — Z79899 Other long term (current) drug therapy: Secondary | ICD-10-CM | POA: Insufficient documentation

## 2021-06-11 DIAGNOSIS — Z7984 Long term (current) use of oral hypoglycemic drugs: Secondary | ICD-10-CM | POA: Insufficient documentation

## 2021-06-11 DIAGNOSIS — M545 Low back pain, unspecified: Secondary | ICD-10-CM | POA: Insufficient documentation

## 2021-06-11 MED ORDER — IBUPROFEN 400 MG PO TABS
600.0000 mg | ORAL_TABLET | Freq: Once | ORAL | Status: AC
  Administered 2021-06-11: 600 mg via ORAL
  Filled 2021-06-11: qty 1

## 2021-06-11 NOTE — ED Triage Notes (Signed)
PT states he was tx for an infection in his spine back in June.  He was given 1 bag of antibiotics in the ED and told to follow up for a 2nd bag in outpatient.  He did not follow up.  Since then he has been having intermittent back pain in the same area.  He came today b/c his 45 yo cousin died in her sleep and he doesn't want to ignore the problem anymore.

## 2021-06-11 NOTE — ED Provider Notes (Signed)
[facility] EMERGENCY DEPARTMENT Provider Note   CSN: [id number] Arrival date & time: 06/11/21  1633     History Chief Complaint  Patient presents with   Back Pain   Possible infection    Clinical Course as of 06/11/21 1822  Sat Jun 11, 2021  1819 This is a 45 year old male with a history of lumbar spine discitis, MSSA bacteremia in June 2022, who did receive IV antibiotics in the hospital but left AGAINST MEDICAL ADVICE, presenting back to the ED for follow-up.  The patient reports that he continues to have chronic pain in his lower back, but it is not worsening since then.  He is able to walk.  He denies numbness or weakness in his legs or urinary or bladder incontinence.  He denies fevers or chills.  He states he came to the ER today because "my brother told me to come get this checked out".  He has not followed up with infectious disease.  On my evaluation the patient is well-appearing.  Is afebrile.  He does not have any midline tenderness or induration over the spine.  He is neurologically intact and ambulating steadily.  He is not wanting to stay for further work-up and in fact wants to leave.  I will place referral back to infectious disease and strongly advised him to follow-up with a specialist in the office.  I explained that this condition may require several rounds of antibiotics, and that it is impossible for me to determine that he no longer has significant infection in his back.  I do doubt that he has continued bacteremia given that his been 6 months and he is feeling well otherwise. 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 No pertinent surgical history.     No family history on file.  Social History   Tobacco Use   Smoking status: Some Days   Smokeless tobacco: Never  Substance Use Topics   Alcohol use: Not Currently   Drug use: Not Currently    Home Medications Prior to Admission medications   Medication Sig Start Date End Date Taking? Authorizing Provider  Accu-Chek Softclix Lancets lancets Use as directed 12/29/20   Mariel Aloe, MD  Blood Glucose Monitoring Suppl (ACCU-CHEK GUIDE) w/Device KIT Use as directed 12/29/20   Mariel Aloe, MD  glucose blood test strip Use as directed 12/29/20   Mariel Aloe, MD  insulin isophane & regular human KwikPen (NOVOLIN 70/30 KWIKPEN) (70-30) 100 UNIT/ML KwikPen Inject 20 Units into the skin 2 (two) times daily with a meal. 12/29/20   Mariel Aloe, MD  Insulin Pen Needle 32G X 4 MM MISC Use as directed 12/29/20   Mariel Aloe, MD  metFORMIN (GLUCOPHAGE) 500 MG tablet Take 1 tablet (500 mg total) by mouth 2 (two) times daily with a meal. 12/29/20   Mariel Aloe, MD  polyethylene glycol powder (GLYCOLAX/MIRALAX) 17 GM/SCOOP powder Dissolve 1 capful (17 g total) in water and drink 2 (two) times daily. 12/29/20   Mariel Aloe, MD  pregabalin (LYRICA) 100 MG capsule Take 1 capsule (100 mg total) by mouth 3 (three) times daily. 12/29/20 01/28/21  Mariel Aloe, MD    Allergies    Patient has no known allergies.  Review of Systems   Review of Systems  Constitutional:  Negative for chills and fever.  Eyes:  Negative for  pain and visual disturbance.  Respiratory:  Negative for cough and shortness of breath.   Cardiovascular:  Negative for chest pain and palpitations.  Gastrointestinal:  Negative for abdominal pain and vomiting.  Genitourinary:  Negative for difficulty urinating, dysuria and hematuria.  Musculoskeletal:  Positive for back pain and myalgias.  Skin:  Negative for color change and rash.  Neurological:  Negative for syncope, weakness and numbness.  All other systems reviewed and are negative.  Physical Exam Updated Vital Signs BP 129/80 (BP Location: Left Arm)   Pulse 77   Temp 99 F (37.2 C) (Oral)   Resp 18   Ht 5' 6"  (1.676 m)   Wt 82.1 kg   SpO2 100%   BMI 29.21 kg/m   Physical Exam Constitutional:      General: He is not in acute distress. HENT:     Head: Normocephalic and atraumatic.  Eyes:     Conjunctiva/sclera: Conjunctivae normal.     Pupils: Pupils are equal, round, and reactive to light.  Cardiovascular:     Rate and Rhythm: Normal rate and regular rhythm.  Pulmonary:     Effort: Pulmonary effort is normal. No respiratory distress.  Abdominal:     General: There is no distension.     Tenderness: There is no abdominal tenderness.  Musculoskeletal:        General: No swelling or tenderness.  Skin:    General: Skin is warm and dry.  Neurological:     General: No focal deficit present.     Mental Status: He is alert and oriented to person, place, and time. Mental status is at baseline.     Cranial Nerves: No cranial nerve deficit.     Sensory: No sensory deficit.     Motor: No weakness.  Psychiatric:        Mood and Affect: Mood normal.        Behavior: Behavior normal.    ED Results / Procedures / Treatments   Labs (all labs ordered are listed, but only abnormal results are displayed) Labs Reviewed - No data to display  EKG None  Radiology No results found.  Procedures Procedures   Medications Ordered in ED Medications  ibuprofen (ADVIL) tablet  600 mg (has no administration in time range)    ED Course  I have reviewed the triage vital signs and the nursing notes.  Pertinent labs & imaging results that were available during my care of the patient were reviewed by me and considered in my medical decision making (see chart for details).  Clinical Course as of 06/11/21 Vernelle Emerald  PZW Jun 11, 2021  1819 This is a 45 year old male with a history of lumbar spine discitis, MSSA  bacteremia in June 2022, who did receive IV antibiotics in the hospital but left AGAINST MEDICAL ADVICE, presenting back to the ED for follow-up.  The patient reports that he continues to have chronic pain in his lower back, but it is not worsening since then.  He is able to walk.  He denies numbness or weakness in his legs or urinary or bladder incontinence.  He denies fevers or chills.  He states he came to the ER today because "my brother told me to come get this checked out".  He has not followed up with infectious disease.  On my evaluation the patient is well-appearing.  Is afebrile.  He does not have any midline tenderness or induration over the spine.  He is neurologically intact and ambulating steadily.  He is not wanting to stay for further work-up and in fact wants to leave.  I will place referral back to infectious disease and strongly advised him to follow-up with a specialist in the office.  I explained that this condition may require several rounds of antibiotics, and that it is impossible for me to determine that he no longer has significant infection in his back.  I do doubt that he has continued bacteremia given that his been 6 months and he is feeling well otherwise. [MT]    Clinical Course User Index [MT] Wyvonnia Dusky, MD   Final Clinical Impression(s) / ED Diagnoses Final diagnoses:  Chronic midline low back pain without sciatica    Rx / DC Orders ED Discharge Orders          Ordered    Ambulatory referral to Infectious Disease       Comments:  Hx of discitis and bacteremia, lost to follow up in June   06/11/21 1818             Wyvonnia Dusky, MD 06/11/21 Vernelle Emerald

## 2021-06-11 NOTE — Discharge Instructions (Addendum)
PLEASE FOLLOW UP WITH INFECTIOUS DISEASE.  RETURN TO ER for new or concerning symptoms.    Please use Tylenol or ibuprofen for pain.  You may use 600 mg ibuprofen every 6 hours or 1000 mg of Tylenol every 6 hours.  You may choose to alternate between the 2.  This would be most effective.  Not to exceed 4 g of Tylenol within 24 hours.  Not to exceed 3200 mg ibuprofen 24 hours.

## 2021-06-11 NOTE — ED Provider Notes (Signed)
Emergency Medicine Provider Triage Evaluation Note  Jeffrey Webb , a 45 y.o. male  was evaluated in triage.  Pt complains of mild low back pain for 2 days. Hx of discitis.  States his pain is minimal as opposed to severe pain w discitis.   Review of Systems  Positive: Back pain Negative: Fever   Physical Exam  BP 129/80 (BP Location: Left Arm)   Pulse 77   Temp 99 F (37.2 C) (Oral)   Resp 18   Ht 5\' 6"  (1.676 m)   Wt 82.1 kg   SpO2 100%   BMI 29.21 kg/m  Gen:   Awake, no distress   Resp:  Normal effort  MSK:   Moves extremities without difficulty  Other:  No significant midline C/T/L spine TTP  Medical Decision Making  Medically screening exam initiated at 6:07 PM.  Appropriate orders placed.  Layten Aiken was informed that the remainder of the evaluation will be completed by another provider, this initial triage assessment does not replace that evaluation, and the importance of remaining in the ED until their evaluation is complete.  Back pain. Mild no neuro symptoms.  History without symptoms of urinary or stool retention or incontinence, neurologic changes such as sensation change or weakness lower extremities, coagulopathy or blood thinner use, is not elderly or with history of osteoporosis, denies any history of cancer, fever, IV drug use, weight changes (unexplained), or prolonged steroid use.  Does have hx of discitis.    Cheyenne Adas [place], DOLE 06/11/21 1808    06/13/21, MD 06/11/21 [phone]

## 2021-06-13 ENCOUNTER — Inpatient Hospital Stay: Payer: Self-pay | Admitting: Physician Assistant

## 2021-06-15 ENCOUNTER — Ambulatory Visit: Payer: Self-pay | Admitting: Internal Medicine

## 2021-06-15 ENCOUNTER — Inpatient Hospital Stay: Payer: Self-pay | Admitting: Physician Assistant

## 2021-06-27 ENCOUNTER — Other Ambulatory Visit (HOSPITAL_COMMUNITY): Payer: Self-pay

## 2022-06-21 ENCOUNTER — Other Ambulatory Visit: Payer: Self-pay

## 2022-06-21 ENCOUNTER — Emergency Department (HOSPITAL_COMMUNITY)
Admission: EM | Admit: 2022-06-21 | Discharge: 2022-06-21 | Discharge status: Against Medical Advice | Payer: Self-pay | Attending: Emergency Medicine | Admitting: Emergency Medicine

## 2022-06-21 DIAGNOSIS — Z5321 Procedure and treatment not carried out due to patient leaving prior to being seen by health care provider: Secondary | ICD-10-CM | POA: Insufficient documentation

## 2022-06-21 NOTE — ED Notes (Signed)
Called x3 for triage and no response

## 2022-10-05 IMAGING — CR DG FOOT COMPLETE 3+V*R*
3 series · 3 of 3 positions shown · non-contrast
Comparison: None.

CLINICAL DATA: Abscess

EXAM:
RIGHT FOOT COMPLETE - 3+ VIEW

[foot ap]
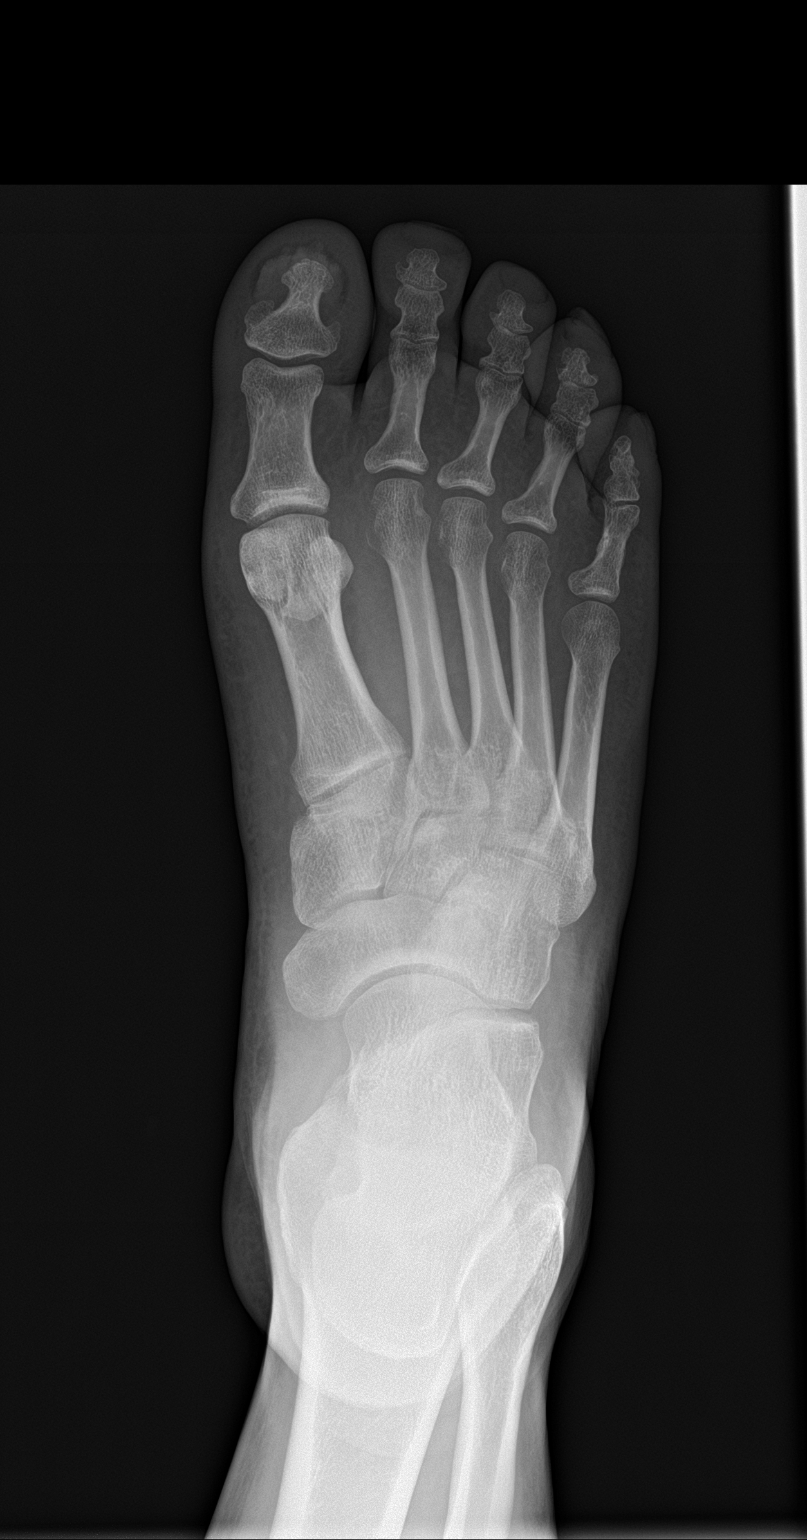

[foot obl]
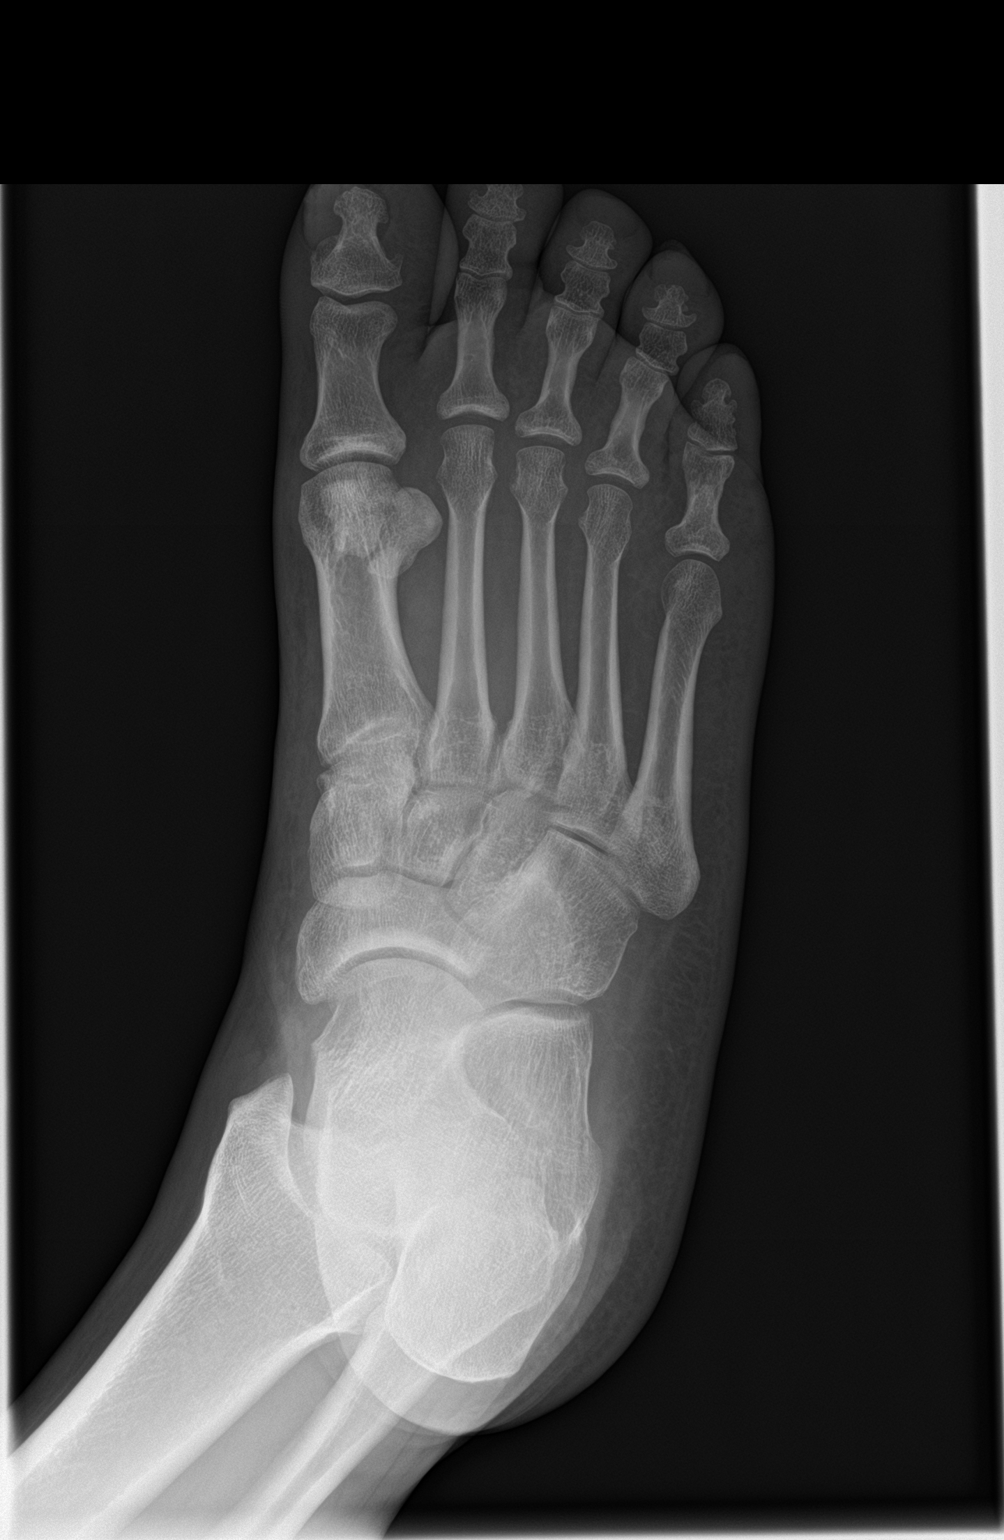

[foot lat]
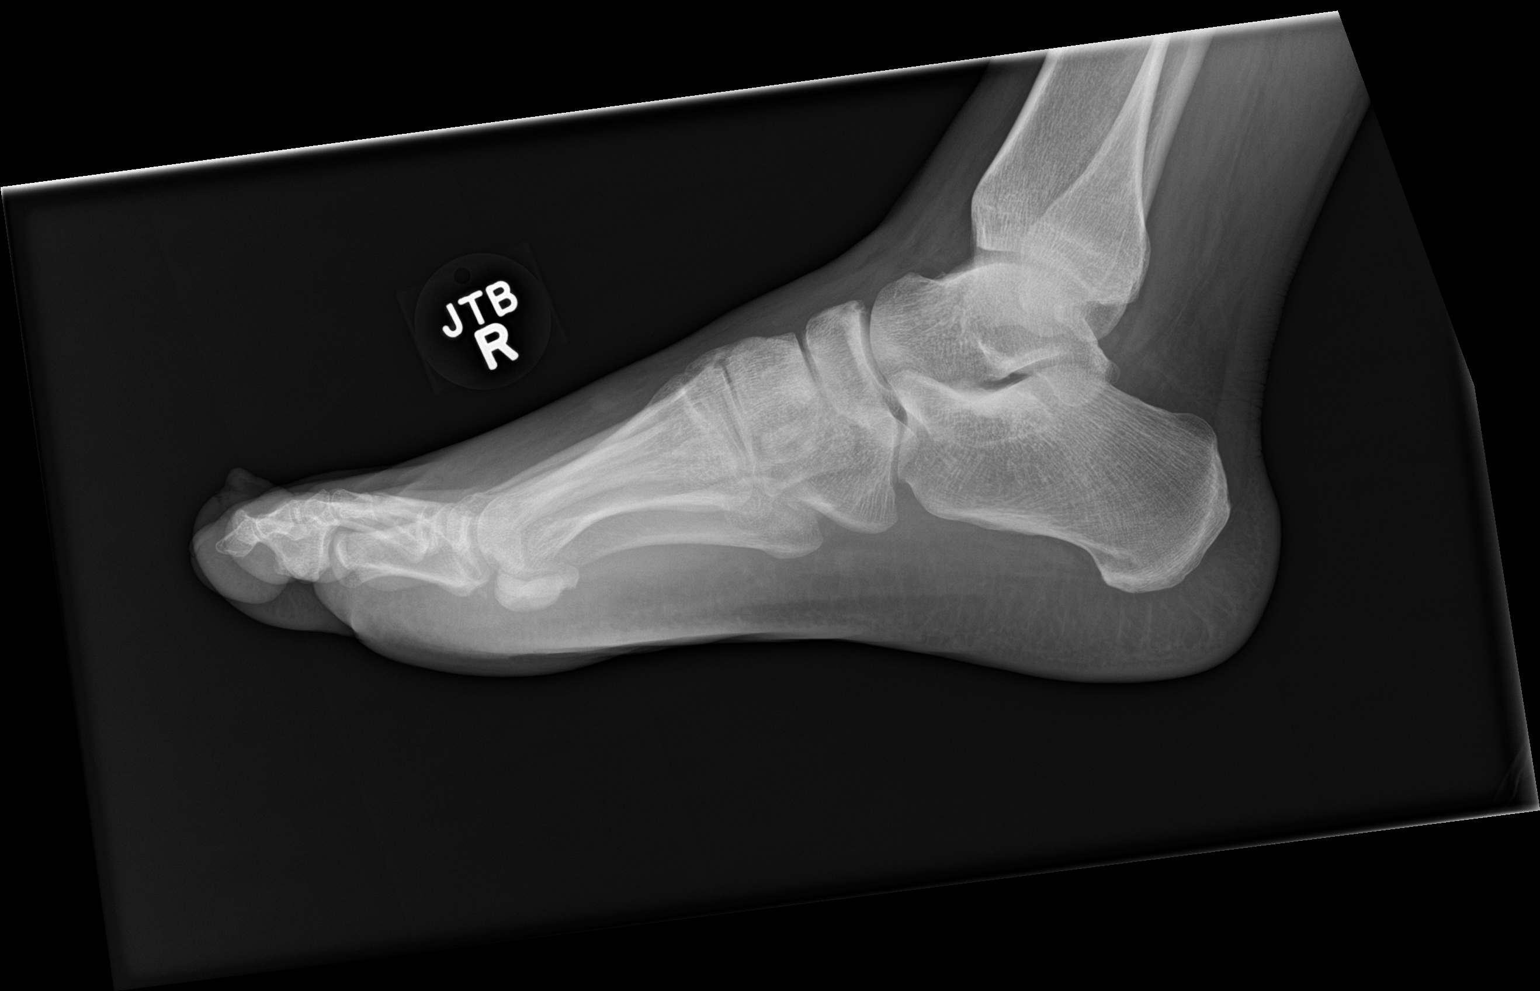

[3 of 3 positions shown; findings below may reference images not displayed]

FINDINGS: Alignment is anatomic. No acute fracture. Joint spaces are
preserved. There is no erosive change or periosteal reaction. No
evidence of soft tissue gas. No radiopaque foreign body.
IMPRESSION: Negative.

## 2022-10-18 IMAGING — DX DG ABD PORTABLE 1V
1 series · 1 of 1 positions shown · non-contrast
Comparison: December 22, 2020.

CLINICAL DATA: Right upper quadrant abdominal tenderness.

EXAM:
PORTABLE ABDOMEN - 1 VIEW

[abdomen]
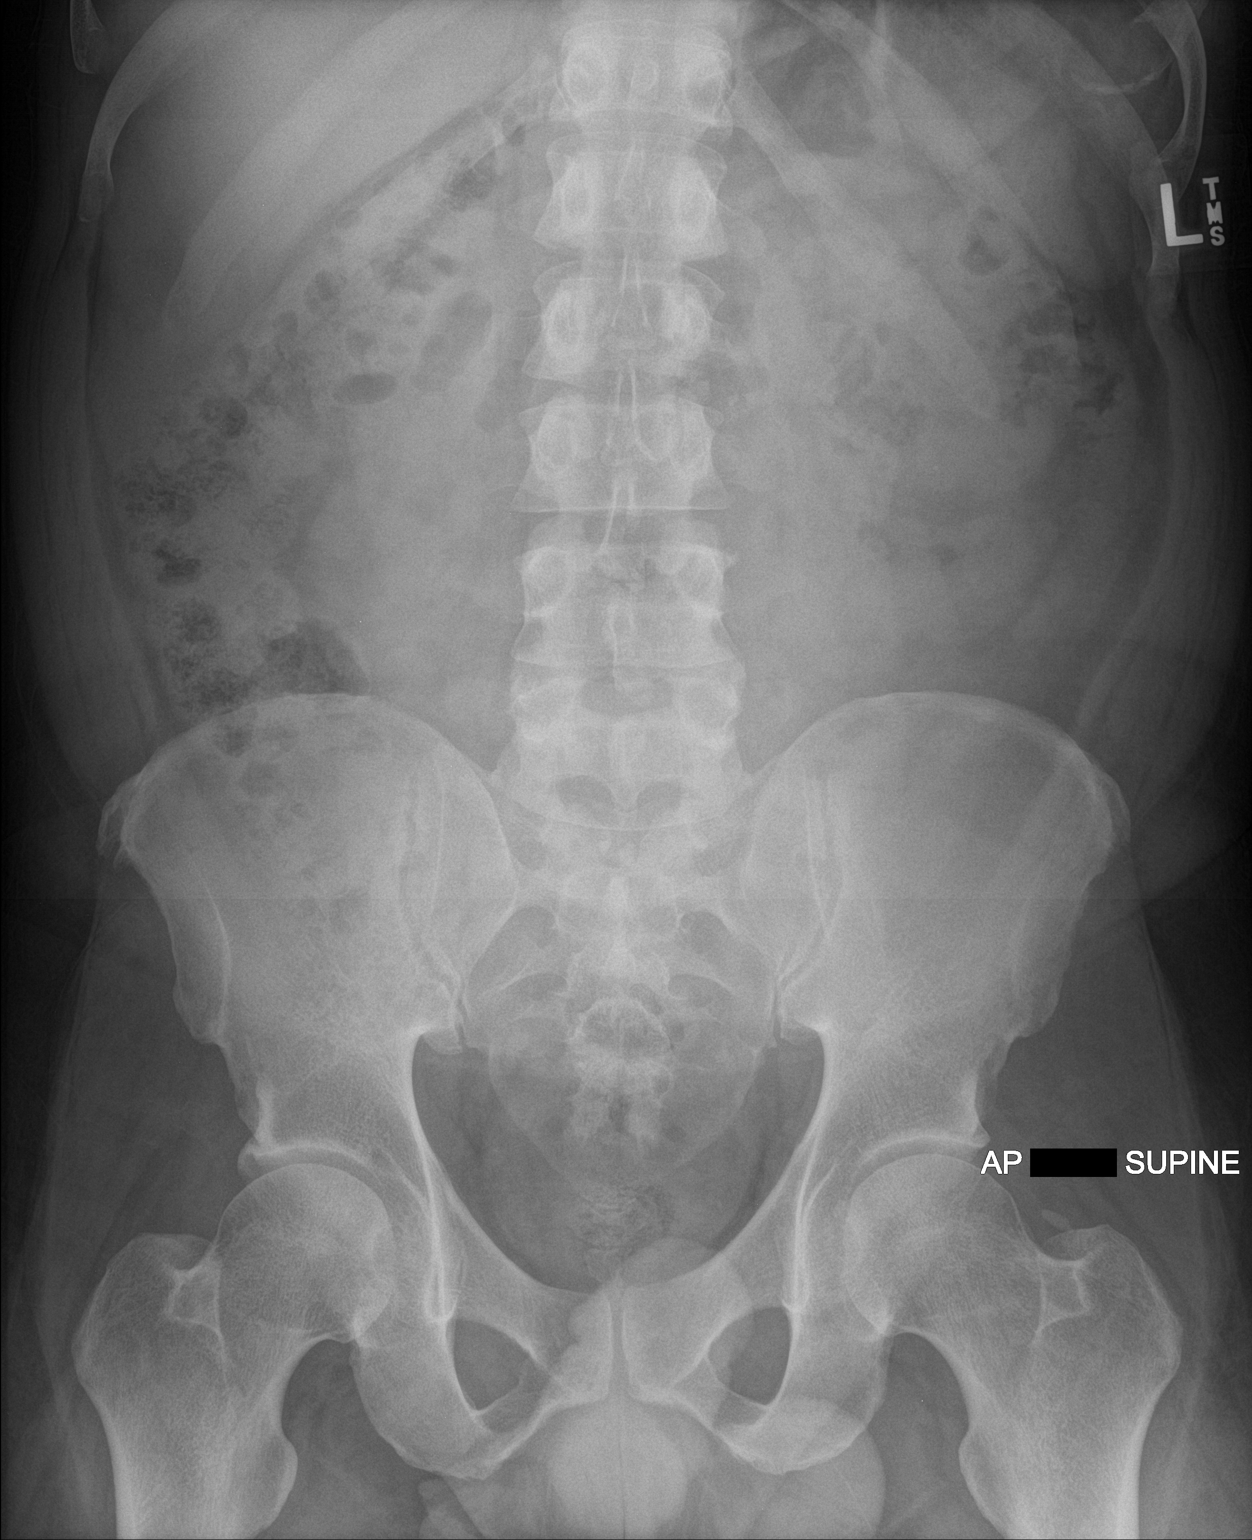

[1 of 1 positions shown; findings below may reference images not displayed]

FINDINGS: Nonobstructive bowel gas pattern. Moderate colonic stool burden in
the ascending and proximal transverse colon. No abnormal
calcifications identified. No obvious free air on this single
portable supine radiograph.
IMPRESSION: Nonobstructive bowel gas pattern. Moderate colonic stool burden in
the ascending and proximal transverse colon.

## 2022-12-16 DIAGNOSIS — Z419 Encounter for procedure for purposes other than remedying health state, unspecified: Secondary | ICD-10-CM | POA: Diagnosis not present

## 2023-01-15 DIAGNOSIS — Z419 Encounter for procedure for purposes other than remedying health state, unspecified: Secondary | ICD-10-CM | POA: Diagnosis not present

## 2023-02-15 DIAGNOSIS — Z419 Encounter for procedure for purposes other than remedying health state, unspecified: Secondary | ICD-10-CM | POA: Diagnosis not present

## 2023-03-18 DIAGNOSIS — Z419 Encounter for procedure for purposes other than remedying health state, unspecified: Secondary | ICD-10-CM | POA: Diagnosis not present

## 2023-03-26 ENCOUNTER — Ambulatory Visit (HOSPITAL_COMMUNITY)
Admission: EM | Admit: 2023-03-26 | Discharge: 2023-03-26 | Disposition: A | Discharge status: Home / Self Care | Payer: Medicaid Other | Attending: Internal Medicine | Admitting: Internal Medicine

## 2023-03-26 ENCOUNTER — Telehealth (HOSPITAL_COMMUNITY): Payer: Self-pay | Admitting: Internal Medicine

## 2023-03-26 ENCOUNTER — Encounter (HOSPITAL_COMMUNITY): Payer: Self-pay

## 2023-03-26 ENCOUNTER — Other Ambulatory Visit (HOSPITAL_COMMUNITY): Payer: Self-pay

## 2023-03-26 DIAGNOSIS — E119 Type 2 diabetes mellitus without complications: Secondary | ICD-10-CM | POA: Diagnosis not present

## 2023-03-26 DIAGNOSIS — Z760 Encounter for issue of repeat prescription: Secondary | ICD-10-CM | POA: Diagnosis not present

## 2023-03-26 LAB — CBG MONITORING, ED: CBG: 243

## 2023-03-26 MED ORDER — ACCU-CHEK GUIDE W/DEVICE KIT: PACK | 0 refills | Status: AC

## 2023-03-26 MED ORDER — METFORMIN HCL 500 MG PO TABS
500.0000 mg | ORAL_TABLET | Freq: Two times a day (BID) | ORAL | 0 refills | Status: DC
  Filled 2023-03-26: qty 60, 30d supply, fill #0

## 2023-03-26 MED ORDER — NOVOLIN 70/30 FLEXPEN (70-30) 100 UNIT/ML ~~LOC~~ SUPN
20.0000 [IU] | PEN_INJECTOR | Freq: Two times a day (BID) | SUBCUTANEOUS | 2 refills | Status: DC
  Filled 2023-03-26: qty 15, 38d supply, fill #0

## 2023-03-26 MED ORDER — INSULIN PEN NEEDLE 32G X 4 MM MISC: 2 refills | Status: AC

## 2023-03-26 MED ORDER — METFORMIN HCL 500 MG PO TABS: 500.0000 mg | ORAL_TABLET | Freq: Two times a day (BID) | ORAL | 0 refills | Status: AC

## 2023-03-26 MED ORDER — INSULIN PEN NEEDLE 32G X 4 MM MISC
2 refills | Status: DC
  Filled 2023-03-26: qty 100, fill #0

## 2023-03-26 MED ORDER — NOVOLIN 70/30 FLEXPEN (70-30) 100 UNIT/ML ~~LOC~~ SUPN: 20.0000 [IU] | PEN_INJECTOR | Freq: Two times a day (BID) | SUBCUTANEOUS | 2 refills | Status: AC

## 2023-03-26 MED ORDER — GLUCOSE BLOOD VI STRP
ORAL_STRIP | 0 refills | Status: DC
  Filled 2023-03-26: qty 100, fill #0

## 2023-03-26 MED ORDER — ACCU-CHEK SOFTCLIX LANCETS MISC
0 refills | Status: DC
  Filled 2023-03-26: qty 100, fill #0

## 2023-03-26 MED ORDER — POLYETHYLENE GLYCOL 3350 17 GM/SCOOP PO POWD: 17.0000 g | Freq: Two times a day (BID) | ORAL | 0 refills | Status: AC

## 2023-03-26 MED ORDER — ACCU-CHEK GUIDE W/DEVICE KIT
PACK | 0 refills | Status: DC
  Filled 2023-03-26: qty 1, fill #0

## 2023-03-26 MED ORDER — GLUCOSE BLOOD VI STRP: ORAL_STRIP | 0 refills | Status: AC

## 2023-03-26 MED ORDER — ACCU-CHEK SOFTCLIX LANCETS MISC: 0 refills | Status: AC

## 2023-03-26 MED ORDER — POLYETHYLENE GLYCOL 3350 17 GM/SCOOP PO POWD
17.0000 g | Freq: Two times a day (BID) | ORAL | 0 refills | Status: DC
  Filled 2023-03-26: qty 510, 15d supply, fill #0

## 2023-03-26 NOTE — ED Triage Notes (Signed)
Pt is here for med refill on all his medication. Pt states he has not taken insulin in over a month. New patient appointment scheduled for 03/30/2023.

## 2023-03-26 NOTE — Discharge Instructions (Addendum)
Please take medications as prescribed Please keep your appointment on 9/13 you will need blood work done at that time If you have any other concerns please return to urgent care to be reevaluated.

## 2023-03-26 NOTE — ED Provider Notes (Signed)
[facility]    CSN: [id number] Arrival date & time: 03/26/23  1140      History   Chief Complaint Chief Complaint  Patient presents with   Diabetes    HPI Jeffrey Webb is a 47 y.o. male with a history of diabetes comes to urgent care for medication refill.  Patient has ran out of his medications for at least 1 month.  He denies any dysuria, polyuria, urgency or frequency, no polydipsia.  No chest pain or chest pressure.  Patient has an appointment scheduled on 9/13.  HPI  Past Medical History:  Diagnosis Date   Cocaine abuse ([facility])    Diabetes mellitus type 2, uncontrolled    MSSA bacteremia 12/04/2020   Tobacco abuse     Patient Active Problem List   Diagnosis Date Noted   Diabetes mellitus type 2, uncontrolled 12/25/2020   Back pain 12/25/2020   Phlegmon 12/25/2020   Slow transit constipation    Constipation    MSSA bacteremia    Abscess of plantar aspect of foot    Plantar fasciitis, right    Foot infection    Discitis of lumbar region 12/04/2020   Psoas abscess, left ([facility]) 12/04/2020   Type 2 diabetes mellitus with complication, without long-term current use of insulin ([facility]) 12/04/2020   Substance abuse ([facility]) 12/04/2020   Pseudohyponatremia 12/04/2020    History reviewed. No pertinent surgical history.     Home Medications    Prior to Admission medications   Medication Sig Start Date End Date Taking? Authorizing Provider  Accu-Chek Softclix Lancets lancets Use as directed 03/26/23   Lucian Baswell, Britta Mccreedy, MD  Blood Glucose Monitoring Suppl (ACCU-CHEK GUIDE) w/Device KIT Use as directed 03/26/23   Merrilee Jansky, MD  glucose blood test strip Use as directed 03/26/23   Quynn Vilchis, Britta Mccreedy, MD  insulin isophane & regular human KwikPen (NOVOLIN 70/30 KWIKPEN) (70-30) 100 UNIT/ML KwikPen Inject 20 Units into the skin 2 (two) times daily with a meal. 03/26/23   Arden Tinoco, Britta Mccreedy, MD  Insulin Pen Needle 32G X 4 MM MISC Use as directed 03/26/23   Khalie Wince,  Britta Mccreedy, MD  metFORMIN (GLUCOPHAGE) 500 MG tablet Take 1 tablet (500 mg total) by mouth 2 (two) times daily with a meal. 03/26/23   Florenda Watt, Britta Mccreedy, MD  polyethylene glycol powder (GLYCOLAX/MIRALAX) 17 GM/SCOOP powder Dissolve 1 capful (17 g total) in water and drink 2 (two) times daily. 03/26/23   Linzie Criss, Britta Mccreedy, MD  pregabalin (LYRICA) 100 MG capsule Take 1 capsule (100 mg total) by mouth 3 (three) times daily. 12/29/20 01/28/21  Narda Bonds, MD    Family History History reviewed. No pertinent family history.  Social History Social History   Tobacco Use   Smoking status: Some Days   Smokeless tobacco: Never  Substance Use Topics   Alcohol use: Not Currently   Drug use: Not Currently     Allergies   Patient has no known allergies.   Review of Systems Review of Systems As per HPI  Physical Exam Triage Vital Signs ED Triage Vitals [03/26/23 1314]  Encounter Vitals Group     BP 125/76     Systolic BP Percentile      Diastolic BP Percentile      Pulse Rate 76     Resp 18     Temp 97.6 F (36.4 C)     Temp Source Oral     SpO2 98 %     Weight  Height      Head Circumference      Peak Flow      Pain Score      Pain Loc      Pain Education      Exclude from Growth Chart    No data found.  Updated Vital Signs BP 125/76 (BP Location: Left Arm)   Pulse 76   Temp 97.6 F (36.4 C) (Oral)   Resp 18   SpO2 98%   Visual Acuity Right Eye Distance:   Left Eye Distance:   Bilateral Distance:    Right Eye Near:   Left Eye Near:    Bilateral Near:     Physical Exam Vitals and nursing note reviewed.  Constitutional:      General: He is not in acute distress.    Appearance: He is not ill-appearing.  Cardiovascular:     Rate and Rhythm: Normal rate and regular rhythm.     Pulses: Normal pulses.     Heart sounds: Normal heart sounds.  Pulmonary:     Effort: Pulmonary effort is normal.     Breath sounds: Normal breath sounds.  Neurological:      Mental Status: He is alert.      UC Treatments / Results  Labs (all labs ordered are listed, but only abnormal results are displayed) Labs Reviewed  CBG MONITORING, ED    EKG   Radiology No results found.  Procedures Procedures (including critical care time)  Medications Ordered in UC Medications - No data to display  Initial Impression / Assessment and Plan / UC Course  I have reviewed the triage vital signs and the nursing notes.  Pertinent labs & imaging results that were available during my care of the patient were reviewed by me and considered in my medical decision making (see chart for details).     1.  Diabetes mellitus type 2, uncontrolled: Medications have been refilled Patient has an appointment on 9/12 Patient is advised to increase oral fluid intake Return precautions given. Final Clinical Impressions(s) / UC Diagnoses   Final diagnoses:  Encounter for medication refill  Diabetes mellitus type 2 in nonobese [employer])     Discharge Instructions      Please take medications as prescribed Please keep your appointment on 9/13 you will need blood work done at that time If you have any other concerns please return to urgent care to be reevaluated.   ED Prescriptions     Medication Sig Dispense Auth. Provider   Accu-Chek Softclix Lancets lancets Use as directed 100 each Avonte Sensabaugh, Britta Mccreedy, MD   Blood Glucose Monitoring Suppl (ACCU-CHEK GUIDE) w/Device KIT Use as directed 1 kit Suhayla Chisom, Britta Mccreedy, MD   glucose blood test strip Use as directed 100 each Blain Hunsucker, Britta Mccreedy, MD   insulin isophane & regular human KwikPen (NOVOLIN 70/30 KWIKPEN) (70-30) 100 UNIT/ML KwikPen Inject 20 Units into the skin 2 (two) times daily with a meal. 15 mL Lynde Ludwig, Britta Mccreedy, MD   Insulin Pen Needle 32G X 4 MM MISC Use as directed 100 each Santiaga Butzin, Britta Mccreedy, MD   metFORMIN (GLUCOPHAGE) 500 MG tablet Take 1 tablet (500 mg total) by mouth 2 (two) times daily with a meal. 60 tablet  Ashia Dehner, Britta Mccreedy, MD   polyethylene glycol powder (GLYCOLAX/MIRALAX) 17 GM/SCOOP powder Dissolve 1 capful (17 g total) in water and drink 2 (two) times daily. 510 g Zayden Maffei, Britta Mccreedy, MD      PDMP not reviewed this encounter.  Merrilee Jansky, MD 03/26/23 1352

## 2023-04-17 DIAGNOSIS — Z419 Encounter for procedure for purposes other than remedying health state, unspecified: Secondary | ICD-10-CM | POA: Diagnosis not present

## 2023-07-27 NOTE — Telephone Encounter (Signed)
 See order(s).

## 2023-07-29 DIAGNOSIS — Z419 Encounter for procedure for purposes other than remedying health state, unspecified: Secondary | ICD-10-CM | POA: Diagnosis not present

## 2023-08-18 DIAGNOSIS — Z419 Encounter for procedure for purposes other than remedying health state, unspecified: Secondary | ICD-10-CM | POA: Diagnosis not present

## 2023-09-15 DIAGNOSIS — Z419 Encounter for procedure for purposes other than remedying health state, unspecified: Secondary | ICD-10-CM | POA: Diagnosis not present

## 2023-10-27 DIAGNOSIS — Z419 Encounter for procedure for purposes other than remedying health state, unspecified: Secondary | ICD-10-CM | POA: Diagnosis not present

## 2023-11-26 DIAGNOSIS — Z419 Encounter for procedure for purposes other than remedying health state, unspecified: Secondary | ICD-10-CM | POA: Diagnosis not present
